# Patient Record
Sex: Female | Born: 1955 | Race: White | Hispanic: No | Marital: Married | Smoking: Never smoker
Health system: Southern US, Community
[De-identification: ages and names within clinical notes are randomized; demographics above are authoritative.]

## PROBLEM LIST (undated history)

## (undated) DIAGNOSIS — E785 Hyperlipidemia, unspecified: Secondary | ICD-10-CM

## (undated) DIAGNOSIS — E119 Type 2 diabetes mellitus without complications: Secondary | ICD-10-CM

## (undated) DIAGNOSIS — H269 Unspecified cataract: Secondary | ICD-10-CM

## (undated) DIAGNOSIS — T7840XA Allergy, unspecified, initial encounter: Secondary | ICD-10-CM

## (undated) DIAGNOSIS — E039 Hypothyroidism, unspecified: Secondary | ICD-10-CM

## (undated) DIAGNOSIS — E079 Disorder of thyroid, unspecified: Secondary | ICD-10-CM

## (undated) DIAGNOSIS — I10 Essential (primary) hypertension: Secondary | ICD-10-CM

## (undated) DIAGNOSIS — M1990 Unspecified osteoarthritis, unspecified site: Secondary | ICD-10-CM

## (undated) DIAGNOSIS — Z981 Arthrodesis status: Secondary | ICD-10-CM

## (undated) DIAGNOSIS — M4800 Spinal stenosis, site unspecified: Secondary | ICD-10-CM

## (undated) DIAGNOSIS — E1169 Type 2 diabetes mellitus with other specified complication: Secondary | ICD-10-CM

## (undated) HISTORY — PX: KNEE SURGERY: SHX244

## (undated) HISTORY — PX: LUMBAR LAMINECTOMY: SHX95

## (undated) HISTORY — PX: TONSILLECTOMY AND ADENOIDECTOMY: SUR1326

## (undated) HISTORY — PX: CERVICAL FUSION: SHX112

## (undated) HISTORY — DX: Type 2 diabetes mellitus with other specified complication: E11.69

## (undated) HISTORY — PX: CATARACT EXTRACTION: SUR2

## (undated) HISTORY — DX: Unspecified cataract: H26.9

## (undated) HISTORY — DX: Allergy, unspecified, initial encounter: T78.40XA

## (undated) HISTORY — PX: ABDOMINAL HYSTERECTOMY: SHX81

## (undated) HISTORY — PX: CARPAL TUNNEL RELEASE: SHX101

## (undated) HISTORY — DX: Hyperlipidemia, unspecified: E78.5

## (undated) HISTORY — DX: Spinal stenosis, site unspecified: M48.00

## (undated) HISTORY — DX: Unspecified osteoarthritis, unspecified site: M19.90

## (undated) HISTORY — PX: SPINE SURGERY: SHX786

## (undated) HISTORY — DX: Type 2 diabetes mellitus without complications: E11.9

---

## 1998-03-05 ENCOUNTER — Encounter: Admission: RE | Admit: 1998-03-05 | Discharge: 1998-06-03 | Payer: Self-pay | Admitting: *Deleted

## 1998-03-27 ENCOUNTER — Ambulatory Visit (HOSPITAL_COMMUNITY): Admission: RE | Admit: 1998-03-27 | Discharge: 1998-03-27 | Payer: Self-pay | Admitting: Neurosurgery

## 1998-03-27 ENCOUNTER — Encounter: Payer: Self-pay | Admitting: Neurosurgery

## 1998-04-10 ENCOUNTER — Encounter: Payer: Self-pay | Admitting: Neurosurgery

## 1998-04-10 ENCOUNTER — Ambulatory Visit (HOSPITAL_COMMUNITY): Admission: RE | Admit: 1998-04-10 | Discharge: 1998-04-10 | Payer: Self-pay | Admitting: Neurosurgery

## 1998-04-24 ENCOUNTER — Ambulatory Visit (HOSPITAL_COMMUNITY): Admission: RE | Admit: 1998-04-24 | Discharge: 1998-04-24 | Payer: Self-pay | Admitting: Neurosurgery

## 1998-04-24 ENCOUNTER — Encounter: Payer: Self-pay | Admitting: Neurosurgery

## 1998-05-14 ENCOUNTER — Encounter: Payer: Self-pay | Admitting: Neurosurgery

## 1998-05-16 ENCOUNTER — Encounter: Payer: Self-pay | Admitting: Neurosurgery

## 1998-05-16 ENCOUNTER — Inpatient Hospital Stay (HOSPITAL_COMMUNITY): Admission: RE | Admit: 1998-05-16 | Discharge: 1998-05-17 | Payer: Self-pay | Admitting: Neurosurgery

## 1998-08-24 ENCOUNTER — Encounter: Payer: Self-pay | Admitting: Neurosurgery

## 1998-08-24 ENCOUNTER — Ambulatory Visit (HOSPITAL_COMMUNITY): Admission: RE | Admit: 1998-08-24 | Discharge: 1998-08-24 | Payer: Self-pay | Admitting: Neurosurgery

## 1998-09-29 ENCOUNTER — Emergency Department (HOSPITAL_COMMUNITY): Admission: EM | Admit: 1998-09-29 | Discharge: 1998-09-29 | Payer: Self-pay | Admitting: Emergency Medicine

## 1998-12-05 ENCOUNTER — Encounter: Payer: Self-pay | Admitting: Neurosurgery

## 1998-12-05 ENCOUNTER — Ambulatory Visit (HOSPITAL_COMMUNITY): Admission: RE | Admit: 1998-12-05 | Discharge: 1998-12-05 | Payer: Self-pay | Admitting: Neurosurgery

## 1999-06-12 ENCOUNTER — Encounter: Admission: RE | Admit: 1999-06-12 | Discharge: 1999-07-24 | Payer: Self-pay | Admitting: Neurosurgery

## 1999-06-29 ENCOUNTER — Other Ambulatory Visit: Admission: RE | Admit: 1999-06-29 | Discharge: 1999-06-29 | Payer: Self-pay | Admitting: Obstetrics and Gynecology

## 1999-10-09 ENCOUNTER — Encounter: Admission: RE | Admit: 1999-10-09 | Discharge: 1999-10-09 | Payer: Self-pay | Admitting: Neurosurgery

## 1999-10-09 ENCOUNTER — Encounter: Payer: Self-pay | Admitting: Neurosurgery

## 1999-11-13 ENCOUNTER — Encounter: Payer: Self-pay | Admitting: Neurosurgery

## 1999-11-13 ENCOUNTER — Ambulatory Visit (HOSPITAL_COMMUNITY): Admission: RE | Admit: 1999-11-13 | Discharge: 1999-11-13 | Payer: Self-pay | Admitting: Neurosurgery

## 2000-10-04 ENCOUNTER — Other Ambulatory Visit: Admission: RE | Admit: 2000-10-04 | Discharge: 2000-10-04 | Payer: Self-pay | Admitting: Obstetrics and Gynecology

## 2002-01-12 ENCOUNTER — Other Ambulatory Visit: Admission: RE | Admit: 2002-01-12 | Discharge: 2002-01-12 | Payer: Self-pay | Admitting: Obstetrics and Gynecology

## 2003-02-28 ENCOUNTER — Other Ambulatory Visit: Admission: RE | Admit: 2003-02-28 | Discharge: 2003-02-28 | Payer: Self-pay | Admitting: Obstetrics and Gynecology

## 2003-05-02 ENCOUNTER — Ambulatory Visit (HOSPITAL_COMMUNITY): Admission: RE | Admit: 2003-05-02 | Discharge: 2003-05-02 | Payer: Self-pay | Admitting: Obstetrics and Gynecology

## 2003-11-05 ENCOUNTER — Inpatient Hospital Stay (HOSPITAL_COMMUNITY): Admission: RE | Admit: 2003-11-05 | Discharge: 2003-11-07 | Payer: Self-pay | Admitting: Obstetrics and Gynecology

## 2004-10-01 ENCOUNTER — Ambulatory Visit (HOSPITAL_BASED_OUTPATIENT_CLINIC_OR_DEPARTMENT_OTHER): Admission: RE | Admit: 2004-10-01 | Discharge: 2004-10-01 | Payer: Self-pay | Admitting: Orthopedic Surgery

## 2004-10-01 ENCOUNTER — Ambulatory Visit (HOSPITAL_COMMUNITY): Admission: RE | Admit: 2004-10-01 | Discharge: 2004-10-01 | Payer: Self-pay | Admitting: Orthopedic Surgery

## 2005-01-08 ENCOUNTER — Ambulatory Visit (HOSPITAL_COMMUNITY): Admission: RE | Admit: 2005-01-08 | Discharge: 2005-01-10 | Payer: Self-pay | Admitting: Neurosurgery

## 2008-09-11 DIAGNOSIS — F988 Other specified behavioral and emotional disorders with onset usually occurring in childhood and adolescence: Secondary | ICD-10-CM | POA: Insufficient documentation

## 2009-11-25 DIAGNOSIS — J309 Allergic rhinitis, unspecified: Secondary | ICD-10-CM | POA: Insufficient documentation

## 2010-06-19 ENCOUNTER — Inpatient Hospital Stay (INDEPENDENT_AMBULATORY_CARE_PROVIDER_SITE_OTHER)
Admission: RE | Admit: 2010-06-19 | Discharge: 2010-06-19 | Disposition: A | Discharge status: Home / Self Care | Payer: 59 | Source: Ambulatory Visit | Attending: Family Medicine | Admitting: Family Medicine

## 2010-06-19 DIAGNOSIS — IMO0002 Reserved for concepts with insufficient information to code with codable children: Secondary | ICD-10-CM

## 2010-06-22 LAB — HM COLONOSCOPY

## 2010-07-02 ENCOUNTER — Other Ambulatory Visit (HOSPITAL_COMMUNITY): Payer: Self-pay | Admitting: Neurosurgery

## 2010-07-02 DIAGNOSIS — M542 Cervicalgia: Secondary | ICD-10-CM

## 2010-07-06 ENCOUNTER — Ambulatory Visit (HOSPITAL_COMMUNITY)
Admission: RE | Admit: 2010-07-06 | Discharge: 2010-07-06 | Disposition: A | Discharge status: Home / Self Care | Payer: 59 | Source: Ambulatory Visit | Attending: Neurosurgery | Admitting: Neurosurgery

## 2010-07-06 DIAGNOSIS — M4802 Spinal stenosis, cervical region: Secondary | ICD-10-CM | POA: Insufficient documentation

## 2010-07-06 DIAGNOSIS — M5124 Other intervertebral disc displacement, thoracic region: Secondary | ICD-10-CM | POA: Insufficient documentation

## 2010-07-06 DIAGNOSIS — Z981 Arthrodesis status: Secondary | ICD-10-CM | POA: Insufficient documentation

## 2010-07-06 DIAGNOSIS — M5020 Other cervical disc displacement, unspecified cervical region: Secondary | ICD-10-CM | POA: Insufficient documentation

## 2010-07-06 DIAGNOSIS — M47812 Spondylosis without myelopathy or radiculopathy, cervical region: Secondary | ICD-10-CM | POA: Insufficient documentation

## 2010-07-06 DIAGNOSIS — M542 Cervicalgia: Secondary | ICD-10-CM | POA: Insufficient documentation

## 2010-07-06 DIAGNOSIS — M25519 Pain in unspecified shoulder: Secondary | ICD-10-CM | POA: Insufficient documentation

## 2010-07-08 ENCOUNTER — Inpatient Hospital Stay (HOSPITAL_COMMUNITY): Admission: RE | Admit: 2010-07-08 | Payer: 59 | Source: Ambulatory Visit

## 2010-07-14 ENCOUNTER — Other Ambulatory Visit: Payer: Self-pay | Admitting: Neurosurgery

## 2010-07-14 DIAGNOSIS — M542 Cervicalgia: Secondary | ICD-10-CM

## 2010-07-14 DIAGNOSIS — M47812 Spondylosis without myelopathy or radiculopathy, cervical region: Secondary | ICD-10-CM

## 2010-07-16 ENCOUNTER — Ambulatory Visit
Admission: RE | Admit: 2010-07-16 | Discharge: 2010-07-16 | Disposition: A | Discharge status: Home / Self Care | Payer: 59 | Source: Ambulatory Visit | Attending: Neurosurgery | Admitting: Neurosurgery

## 2010-07-16 ENCOUNTER — Other Ambulatory Visit: Payer: Self-pay | Admitting: Family Medicine

## 2010-07-16 DIAGNOSIS — M542 Cervicalgia: Secondary | ICD-10-CM

## 2010-07-16 DIAGNOSIS — M47812 Spondylosis without myelopathy or radiculopathy, cervical region: Secondary | ICD-10-CM

## 2010-08-11 ENCOUNTER — Other Ambulatory Visit: Payer: Self-pay | Admitting: Neurosurgery

## 2010-08-11 DIAGNOSIS — M47812 Spondylosis without myelopathy or radiculopathy, cervical region: Secondary | ICD-10-CM

## 2010-08-11 DIAGNOSIS — M542 Cervicalgia: Secondary | ICD-10-CM

## 2010-08-14 ENCOUNTER — Other Ambulatory Visit: Payer: 59

## 2010-12-07 ENCOUNTER — Ambulatory Visit
Admission: RE | Admit: 2010-12-07 | Discharge: 2010-12-07 | Disposition: A | Discharge status: Home / Self Care | Payer: 59 | Source: Ambulatory Visit | Attending: Neurosurgery | Admitting: Neurosurgery

## 2010-12-07 DIAGNOSIS — M542 Cervicalgia: Secondary | ICD-10-CM

## 2010-12-07 DIAGNOSIS — M47812 Spondylosis without myelopathy or radiculopathy, cervical region: Secondary | ICD-10-CM

## 2010-12-07 MED ORDER — TRIAMCINOLONE ACETONIDE 40 MG/ML IJ SUSP (RADIOLOGY)
60.0000 mg | Freq: Once | INTRAMUSCULAR | Status: AC
  Administered 2010-12-07: 60 mg via EPIDURAL

## 2010-12-07 MED ORDER — IOHEXOL 300 MG/ML  SOLN
1.0000 mL | Freq: Once | INTRAMUSCULAR | Status: AC | PRN
  Administered 2010-12-07: 1 mL via EPIDURAL

## 2011-02-15 ENCOUNTER — Other Ambulatory Visit: Payer: Self-pay | Admitting: Dermatology

## 2011-04-28 DIAGNOSIS — M1990 Unspecified osteoarthritis, unspecified site: Secondary | ICD-10-CM | POA: Insufficient documentation

## 2011-06-07 LAB — HM DEXA SCAN

## 2013-01-22 DIAGNOSIS — E669 Obesity, unspecified: Secondary | ICD-10-CM | POA: Insufficient documentation

## 2013-02-23 ENCOUNTER — Other Ambulatory Visit: Payer: Self-pay | Admitting: Dermatology

## 2013-03-26 DIAGNOSIS — E039 Hypothyroidism, unspecified: Secondary | ICD-10-CM | POA: Insufficient documentation

## 2013-09-11 DIAGNOSIS — Z79890 Hormone replacement therapy (postmenopausal): Secondary | ICD-10-CM | POA: Insufficient documentation

## 2013-09-11 DIAGNOSIS — G4700 Insomnia, unspecified: Secondary | ICD-10-CM | POA: Insufficient documentation

## 2013-12-05 ENCOUNTER — Encounter (HOSPITAL_COMMUNITY): Payer: Self-pay | Admitting: Emergency Medicine

## 2013-12-05 ENCOUNTER — Emergency Department (HOSPITAL_COMMUNITY)
Admission: EM | Admit: 2013-12-05 | Discharge: 2013-12-05 | Disposition: A | Discharge status: Home / Self Care | Payer: Worker's Compensation | Attending: Emergency Medicine | Admitting: Emergency Medicine

## 2013-12-05 DIAGNOSIS — Z981 Arthrodesis status: Secondary | ICD-10-CM | POA: Insufficient documentation

## 2013-12-05 DIAGNOSIS — Y9389 Activity, other specified: Secondary | ICD-10-CM | POA: Diagnosis not present

## 2013-12-05 DIAGNOSIS — T148XXA Other injury of unspecified body region, initial encounter: Secondary | ICD-10-CM

## 2013-12-05 DIAGNOSIS — S20309A Unspecified superficial injuries of unspecified front wall of thorax, initial encounter: Secondary | ICD-10-CM | POA: Diagnosis present

## 2013-12-05 DIAGNOSIS — S4992XA Unspecified injury of left shoulder and upper arm, initial encounter: Secondary | ICD-10-CM | POA: Diagnosis not present

## 2013-12-05 DIAGNOSIS — S79911A Unspecified injury of right hip, initial encounter: Secondary | ICD-10-CM | POA: Diagnosis not present

## 2013-12-05 DIAGNOSIS — Y92410 Unspecified street and highway as the place of occurrence of the external cause: Secondary | ICD-10-CM | POA: Diagnosis not present

## 2013-12-05 DIAGNOSIS — S20211A Contusion of right front wall of thorax, initial encounter: Secondary | ICD-10-CM | POA: Diagnosis not present

## 2013-12-05 DIAGNOSIS — S139XXA Sprain of joints and ligaments of unspecified parts of neck, initial encounter: Secondary | ICD-10-CM | POA: Insufficient documentation

## 2013-12-05 DIAGNOSIS — S79922A Unspecified injury of left thigh, initial encounter: Secondary | ICD-10-CM | POA: Insufficient documentation

## 2013-12-05 HISTORY — DX: Disorder of thyroid, unspecified: E07.9

## 2013-12-05 MED ORDER — DIAZEPAM 5 MG PO TABS: ORAL_TABLET | ORAL | Status: DC

## 2013-12-05 NOTE — ED Notes (Signed)
Dr. Walden at bedside 
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
## 2013-12-27 ENCOUNTER — Ambulatory Visit
Admission: RE | Admit: 2013-12-27 | Discharge: 2013-12-27 | Disposition: A | Discharge status: Home / Self Care | Payer: Worker's Compensation | Source: Ambulatory Visit | Attending: Family Medicine | Admitting: Family Medicine

## 2013-12-27 ENCOUNTER — Other Ambulatory Visit: Payer: Self-pay | Admitting: Family Medicine

## 2013-12-27 DIAGNOSIS — R52 Pain, unspecified: Secondary | ICD-10-CM

## 2014-12-18 ENCOUNTER — Other Ambulatory Visit (HOSPITAL_COMMUNITY): Payer: Self-pay | Admitting: Neurosurgery

## 2014-12-18 DIAGNOSIS — M25511 Pain in right shoulder: Secondary | ICD-10-CM

## 2014-12-24 ENCOUNTER — Ambulatory Visit (HOSPITAL_COMMUNITY)
Admission: RE | Admit: 2014-12-24 | Discharge: 2014-12-24 | Disposition: A | Discharge status: Home / Self Care | Payer: 59 | Source: Ambulatory Visit | Attending: Neurosurgery | Admitting: Neurosurgery

## 2014-12-24 DIAGNOSIS — M25511 Pain in right shoulder: Secondary | ICD-10-CM | POA: Diagnosis not present

## 2014-12-24 DIAGNOSIS — M7551 Bursitis of right shoulder: Secondary | ICD-10-CM | POA: Diagnosis not present

## 2014-12-24 DIAGNOSIS — M7581 Other shoulder lesions, right shoulder: Secondary | ICD-10-CM | POA: Insufficient documentation

## 2014-12-24 DIAGNOSIS — M19011 Primary osteoarthritis, right shoulder: Secondary | ICD-10-CM | POA: Insufficient documentation

## 2015-01-28 LAB — HM HEPATITIS C SCREENING LAB: HM HEPATITIS C SCREENING: NEGATIVE

## 2015-04-22 MED FILL — LEVOTHYROXINE 50 MCG TABLET: 50 | 90 days supply | Qty: 90 | Fill #0

## 2015-04-28 DIAGNOSIS — H524 Presbyopia: Secondary | ICD-10-CM | POA: Diagnosis not present

## 2015-04-28 DIAGNOSIS — H52203 Unspecified astigmatism, bilateral: Secondary | ICD-10-CM | POA: Diagnosis not present

## 2015-05-13 DIAGNOSIS — D2371 Other benign neoplasm of skin of right lower limb, including hip: Secondary | ICD-10-CM | POA: Diagnosis not present

## 2015-05-13 DIAGNOSIS — L821 Other seborrheic keratosis: Secondary | ICD-10-CM | POA: Diagnosis not present

## 2015-05-13 DIAGNOSIS — D2271 Melanocytic nevi of right lower limb, including hip: Secondary | ICD-10-CM | POA: Diagnosis not present

## 2015-05-13 DIAGNOSIS — D2372 Other benign neoplasm of skin of left lower limb, including hip: Secondary | ICD-10-CM | POA: Diagnosis not present

## 2015-05-13 DIAGNOSIS — D225 Melanocytic nevi of trunk: Secondary | ICD-10-CM | POA: Diagnosis not present

## 2015-05-13 DIAGNOSIS — Z85828 Personal history of other malignant neoplasm of skin: Secondary | ICD-10-CM | POA: Diagnosis not present

## 2015-05-13 DIAGNOSIS — D2261 Melanocytic nevi of right upper limb, including shoulder: Secondary | ICD-10-CM | POA: Diagnosis not present

## 2015-06-18 MED FILL — ROSUVASTATIN CALCIUM 5 MG T: 5 | 30 days supply | Qty: 30 | Fill #0

## 2015-07-22 DIAGNOSIS — R319 Hematuria, unspecified: Secondary | ICD-10-CM | POA: Diagnosis not present

## 2015-07-22 DIAGNOSIS — R300 Dysuria: Secondary | ICD-10-CM | POA: Diagnosis not present

## 2015-07-29 DIAGNOSIS — E669 Obesity, unspecified: Secondary | ICD-10-CM | POA: Diagnosis not present

## 2015-07-29 DIAGNOSIS — J208 Acute bronchitis due to other specified organisms: Secondary | ICD-10-CM | POA: Diagnosis not present

## 2015-07-29 DIAGNOSIS — E785 Hyperlipidemia, unspecified: Secondary | ICD-10-CM | POA: Diagnosis not present

## 2015-07-29 DIAGNOSIS — E782 Mixed hyperlipidemia: Secondary | ICD-10-CM | POA: Diagnosis not present

## 2015-07-29 DIAGNOSIS — E1169 Type 2 diabetes mellitus with other specified complication: Secondary | ICD-10-CM | POA: Diagnosis not present

## 2015-07-30 MED FILL — ESTRADIOL 0.5 MG TABLET: 0.5 | 90 days supply | Qty: 180 | Fill #0

## 2015-07-31 MED FILL — BENZONATATE 200 MG CAPSULE: 200 | 7 days supply | Qty: 20 | Fill #0

## 2015-07-31 MED FILL — ROSUVASTATIN CALCIUM 5 MG T: 5 | 90 days supply | Qty: 90 | Fill #1

## 2015-08-20 MED FILL — LEVOTHYROXINE 50 MCG TABLET: 50 | 90 days supply | Qty: 90 | Fill #1

## 2015-09-22 ENCOUNTER — Ambulatory Visit (INDEPENDENT_AMBULATORY_CARE_PROVIDER_SITE_OTHER): Payer: 59 | Admitting: Physician Assistant

## 2015-09-22 VITALS — BP 118/70 | HR 87 | Temp 97.8°F | Resp 18 | Ht 65.0 in | Wt 187.2 lb

## 2015-09-22 DIAGNOSIS — J069 Acute upper respiratory infection, unspecified: Secondary | ICD-10-CM | POA: Diagnosis not present

## 2015-09-22 MED ORDER — PROMETHAZINE-DM 6.25-15 MG/5ML PO SYRP: 5.0000 mL | ORAL_SOLUTION | Freq: Four times a day (QID) | ORAL | 0 refills | Status: DC | PRN

## 2015-09-22 MED ORDER — AMOXICILLIN-POT CLAVULANATE 875-125 MG PO TABS: 1.0000 | ORAL_TABLET | Freq: Two times a day (BID) | ORAL | 0 refills | Status: AC

## 2015-09-22 MED FILL — AMOX-CLAV 875-125 MG TABLET: 875-125 | 5 days supply | Qty: 10 | Fill #0

## 2015-09-22 MED FILL — PROMETHAZINE-DM SYRUP: 6.25-15 | 5 days supply | Qty: 118 | Fill #0

## 2015-09-22 NOTE — Progress Notes (Signed)
Holly Riley  MRN: [id number] DOB: Jun 18, 1955  Subjective:nm   Holly Riley is a 60 y.o. female seen in office today for a chief complaint of illness x 1 week. Started out as a sore throat and then developed a dry hacking cough. The cough is worse during night time and interferes with patient's sleep. Has associated headache, bilateral ear pain, facial pressure, and congestion. Denies fever, sneezing, and itching watery eyes. Has tried tessalon perles and mucinex with mild relief. 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   Review of Systems  Constitutional: Negative for activity change, chills and diaphoresis.  Respiratory: Negative for chest tightness, shortness of breath and wheezing.   Cardiovascular: Negative for chest pain.  Gastrointestinal: Negative for abdominal pain, diarrhea, nausea and vomiting.  Musculoskeletal: Negative for myalgias.  Neurological: Negative for dizziness, weakness and light-headedness.  Psychiatric/Behavioral: Positive for sleep disturbance (due to cough).    Patient Active Problem List   Diagnosis Date Noted  . Insomnia 09/11/2013  . Post-menopause on HRT (hormone replacement therapy) 09/11/2013  . Hypothyroidism 03/26/2013  . Obesity (BMI 30.0-34.9) 01/22/2013  . Osteoarthritis 04/28/2011  . Allergic rhinitis 11/25/2009  . ADD (attention deficit disorder) 09/11/2008    Current Outpatient Prescriptions on File Prior to Visit  Medication Sig Dispense Refill  . acetaminophen (TYLENOL) 500 MG tablet Take 500 mg by mouth every 6 (six) hours as needed.    . diazepam (VALIUM) 5 MG tablet Take 1-2 tablets every 8 hours as needed for muscle spasm 12 tablet 0  . Flaxseed, Linseed, (FLAX SEED OIL PO) Take 1 tablet by mouth daily.    . Garlic 10 MG CAPS Take 10 mg by mouth daily.    Marland Kitchen levothyroxine (SYNTHROID, LEVOTHROID) 50 MCG tablet Take 50 mcg by mouth daily before breakfast.    . Omega-3  Fatty Acids (FISH OIL CONCENTRATE) 1000 MG CAPS Take 1,000 mg by mouth daily.    . Cinnamon 500 MG TABS Take 500 mg by mouth daily.    . Multiple Vitamins-Minerals (MULTIVITAMIN WITH MINERALS) tablet Take 1 tablet by mouth daily.     No current facility-administered medications on file prior to visit.     Allergies  Allergen Reactions  . Morphine And Related Other (See Comments)    hallucinations hallucinations  . Statins Other (See Comments)    myalgis  . Percocet [Oxycodone-Acetaminophen] Itching and Other (See Comments)    tachycardia  . Vicodin [Hydrocodone-Acetaminophen]    Past Medical History:  Diagnosis Date  . Arthritis   . Hyperlipidemia   . Thyroid disease      Objective:  BP 118/70   Pulse 87   Temp 97.8 F (36.6 C) (Oral)   Resp 18   Ht 5\' 5"  (1.651 m)   Wt 187 lb 3.2 oz (84.9 kg)   SpO2 96%   BMI 31.15 kg/m   Physical Exam  Constitutional: She is oriented to person, place, and time and well-developed, well-nourished, and in no distress.  HENT:  Head: Normocephalic and atraumatic.  Right Ear: Hearing, tympanic membrane, external ear and ear canal normal.  Left Ear: Hearing, external ear and ear canal normal. Left ear bulging TM: TM with slight bulge, no pus noted behind TM.  Nose: Mucosal edema present. Right sinus exhibits frontal sinus tenderness (minimal). Right sinus exhibits no maxillary sinus tenderness. Left sinus exhibits frontal sinus tenderness (minimal ). Left sinus exhibits no maxillary  sinus tenderness.  Mouth/Throat: Uvula is midline. Posterior oropharyngeal erythema present. No oropharyngeal exudate or posterior oropharyngeal edema.  Eyes: Conjunctivae are normal.  Neck: Normal range of motion.  Cardiovascular: Normal rate, regular rhythm and normal heart sounds.   Pulmonary/Chest: Effort normal and breath sounds normal.  Lymphadenopathy:       Head (right side): No submental, no submandibular, no tonsillar, no preauricular, no posterior  auricular and no occipital adenopathy present.       Head (left side): No submental, no submandibular, no tonsillar, no preauricular, no posterior auricular and no occipital adenopathy present.    She has no cervical adenopathy.       Right: No supraclavicular adenopathy present.       Left: No supraclavicular adenopathy present.  Neurological: She is alert and oriented to person, place, and time. Gait normal.  Skin: Skin is warm and dry.  Psychiatric: Affect normal.  Vitals reviewed.    Assessment and Plan :  1. 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 Given future prescription for antibiotic if supportive care and cough syrup do not provide any relief in 3-4 days.  - promethazine-dextromethorphan (PROMETHAZINE-DM) 6.25-15 MG/5ML syrup; Take 5 mLs by mouth 4 (four) times daily as needed for cough.  Dispense: 118 mL; Refill: 0 - amoxicillin-clavulanate (AUGMENTIN) 875-125 MG tablet; Take 1 tablet by mouth 2 (two) times daily.  Dispense: 10 tablet; Refill: 0    Tenna Delaine PA-C  [facility] 09/22/2015 12:51 PM

## 2015-09-22 NOTE — Patient Instructions (Addendum)
 -   I recommend you rest, drink plenty of fluids, eat light meals including soups.  - You may use cough syrup at night for your cough and sore throat, Tessalon pearls during the day. Be aware that cough syrup can definitely make you drowsy and sleepy so do not drive or operate any heavy machinery if it is affecting you during the day.  - You may also use Tylenol or ibuprofen over-the-counter for your sore throat.  - Please let me know if you are not seeing any improvement or get worse in 7 days.  If after 3-4 days you have no relief, I have sent in a antibiotic for you to pick up.      IF you received an x-ray today, you will receive an invoice from [facility]. Please contact [facility] at [phone] with questions or concerns regarding your invoice.   IF you received labwork today, you will receive an invoice from [employer]. Please contact [facility] at [phone] with questions or concerns regarding your invoice.   Our billing staff will not be able to assist you with questions regarding bills from these companies.  You will be contacted with the lab results as soon as they are available. The fastest way to get your results is to activate your My Chart account. Instructions are located on the last page of this paperwork. If you have not heard from [place] regarding the results in 2 weeks, please contact this office.

## 2015-10-01 ENCOUNTER — Other Ambulatory Visit: Payer: Self-pay | Admitting: Family Medicine

## 2015-10-01 ENCOUNTER — Telehealth (HOSPITAL_COMMUNITY): Payer: Self-pay | Admitting: *Deleted

## 2015-10-01 DIAGNOSIS — R002 Palpitations: Secondary | ICD-10-CM | POA: Diagnosis not present

## 2015-10-01 DIAGNOSIS — R9431 Abnormal electrocardiogram [ECG] [EKG]: Secondary | ICD-10-CM | POA: Diagnosis not present

## 2015-10-01 DIAGNOSIS — F430 Acute stress reaction: Secondary | ICD-10-CM | POA: Diagnosis not present

## 2015-10-01 DIAGNOSIS — J209 Acute bronchitis, unspecified: Secondary | ICD-10-CM | POA: Diagnosis not present

## 2015-10-01 MED FILL — predniSONE 20 MG TABS: 20 | 5 days supply | Qty: 10 | Fill #0

## 2015-10-01 MED FILL — ZOLPIDEM TARTRATE 10 MG TAB: 10 | 30 days supply | Qty: 30 | Fill #0

## 2015-10-01 NOTE — Telephone Encounter (Signed)
Patient given detailed instructions per Myocardial Perfusion Study Information Sheet for the test on 10/06/15 at 1000. Patient notified to arrive 15 minutes early and that it is imperative to arrive on time for appointment to keep from having the test rescheduled.  If you need to cancel or reschedule your appointment, please call the office within 24 hours of your appointment. Failure to do so may result in a cancellation of your appointment, and a $50 no show fee. Patient verbalized understanding.Merly Hinkson, Ranae Palms

## 2015-10-06 ENCOUNTER — Ambulatory Visit (HOSPITAL_COMMUNITY): Payer: 59 | Attending: Cardiovascular Disease

## 2015-10-06 DIAGNOSIS — Z8249 Family history of ischemic heart disease and other diseases of the circulatory system: Secondary | ICD-10-CM | POA: Insufficient documentation

## 2015-10-06 DIAGNOSIS — R9431 Abnormal electrocardiogram [ECG] [EKG]: Secondary | ICD-10-CM | POA: Diagnosis not present

## 2015-10-06 DIAGNOSIS — R0789 Other chest pain: Secondary | ICD-10-CM | POA: Diagnosis not present

## 2015-10-06 DIAGNOSIS — R0609 Other forms of dyspnea: Secondary | ICD-10-CM | POA: Insufficient documentation

## 2015-10-06 DIAGNOSIS — R002 Palpitations: Secondary | ICD-10-CM | POA: Diagnosis not present

## 2015-10-06 LAB — MYOCARDIAL PERFUSION IMAGING
CHL CUP NUCLEAR SRS: 7
CHL CUP NUCLEAR SSS: 8
LV dias vol: 74 mL (ref 46–106)
LV sys vol: 22 mL
NUC STRESS TID: 0.95
Peak HR: 103 {beats}/min
RATE: 0.34
Rest HR: 71 {beats}/min
SDS: 4

## 2015-10-06 MED ORDER — TECHNETIUM TC 99M TETROFOSMIN IV KIT
32.3000 | PACK | Freq: Once | INTRAVENOUS | Status: AC | PRN
  Administered 2015-10-06: 32.3 via INTRAVENOUS
  Filled 2015-10-06: qty 32

## 2015-10-06 MED ORDER — TECHNETIUM TC 99M TETROFOSMIN IV KIT
10.2000 | PACK | Freq: Once | INTRAVENOUS | Status: AC | PRN
Start: 2015-10-06 — End: 2015-10-06
  Administered 2015-10-06: 10 via INTRAVENOUS
  Filled 2015-10-06: qty 10

## 2015-10-06 MED ORDER — REGADENOSON 0.4 MG/5ML IV SOLN
0.4000 mg | Freq: Once | INTRAVENOUS | Status: AC
  Administered 2015-10-06: 0.4 mg via INTRAVENOUS

## 2015-10-16 DIAGNOSIS — F4320 Adjustment disorder, unspecified: Secondary | ICD-10-CM | POA: Diagnosis not present

## 2015-10-16 DIAGNOSIS — F410 Panic disorder [episodic paroxysmal anxiety] without agoraphobia: Secondary | ICD-10-CM | POA: Diagnosis not present

## 2015-10-31 DIAGNOSIS — Z1231 Encounter for screening mammogram for malignant neoplasm of breast: Secondary | ICD-10-CM | POA: Diagnosis not present

## 2015-10-31 DIAGNOSIS — Z803 Family history of malignant neoplasm of breast: Secondary | ICD-10-CM | POA: Diagnosis not present

## 2015-11-04 LAB — HM MAMMOGRAPHY

## 2015-11-24 MED FILL — LEVOTHYROXINE 50 MCG TABLET: 50 | 90 days supply | Qty: 90 | Fill #0

## 2015-11-24 MED FILL — ROSUVASTATIN CALCIUM 5 MG T: 5 | 90 days supply | Qty: 90 | Fill #2

## 2016-02-05 DIAGNOSIS — E039 Hypothyroidism, unspecified: Secondary | ICD-10-CM | POA: Diagnosis not present

## 2016-02-05 DIAGNOSIS — Z0000 Encounter for general adult medical examination without abnormal findings: Secondary | ICD-10-CM | POA: Diagnosis not present

## 2016-02-05 DIAGNOSIS — E785 Hyperlipidemia, unspecified: Secondary | ICD-10-CM | POA: Diagnosis not present

## 2016-02-05 DIAGNOSIS — E1169 Type 2 diabetes mellitus with other specified complication: Secondary | ICD-10-CM | POA: Diagnosis not present

## 2016-02-05 DIAGNOSIS — E782 Mixed hyperlipidemia: Secondary | ICD-10-CM | POA: Diagnosis not present

## 2016-02-05 DIAGNOSIS — Z79890 Hormone replacement therapy (postmenopausal): Secondary | ICD-10-CM | POA: Diagnosis not present

## 2016-02-14 MED FILL — ESTRADIOL 0.5 MG TABLET: 0.5 | 90 days supply | Qty: 180 | Fill #1

## 2016-02-25 MED FILL — LEVOTHYROXINE 75 MCG TABLET: 75 | 90 days supply | Qty: 90 | Fill #0

## 2016-04-16 DIAGNOSIS — L821 Other seborrheic keratosis: Secondary | ICD-10-CM | POA: Diagnosis not present

## 2016-04-16 DIAGNOSIS — D225 Melanocytic nevi of trunk: Secondary | ICD-10-CM | POA: Diagnosis not present

## 2016-04-16 DIAGNOSIS — D2271 Melanocytic nevi of right lower limb, including hip: Secondary | ICD-10-CM | POA: Diagnosis not present

## 2016-04-16 DIAGNOSIS — D2272 Melanocytic nevi of left lower limb, including hip: Secondary | ICD-10-CM | POA: Diagnosis not present

## 2016-04-16 DIAGNOSIS — Z85828 Personal history of other malignant neoplasm of skin: Secondary | ICD-10-CM | POA: Diagnosis not present

## 2016-04-16 DIAGNOSIS — D2262 Melanocytic nevi of left upper limb, including shoulder: Secondary | ICD-10-CM | POA: Diagnosis not present

## 2016-04-16 DIAGNOSIS — D692 Other nonthrombocytopenic purpura: Secondary | ICD-10-CM | POA: Diagnosis not present

## 2016-04-16 DIAGNOSIS — D224 Melanocytic nevi of scalp and neck: Secondary | ICD-10-CM | POA: Diagnosis not present

## 2016-04-16 DIAGNOSIS — D2372 Other benign neoplasm of skin of left lower limb, including hip: Secondary | ICD-10-CM | POA: Diagnosis not present

## 2016-04-20 DIAGNOSIS — E039 Hypothyroidism, unspecified: Secondary | ICD-10-CM | POA: Diagnosis not present

## 2016-04-20 DIAGNOSIS — G4726 Circadian rhythm sleep disorder, shift work type: Secondary | ICD-10-CM | POA: Diagnosis not present

## 2016-04-20 DIAGNOSIS — J0100 Acute maxillary sinusitis, unspecified: Secondary | ICD-10-CM | POA: Diagnosis not present

## 2016-04-20 MED FILL — CEFDINIR 300 MG CAPSULE: 300 | 10 days supply | Qty: 20 | Fill #0

## 2016-04-21 MED FILL — ZOLPIDEM TARTRATE 10 MG TAB: 10 | 30 days supply | Qty: 30 | Fill #0

## 2016-05-24 MED FILL — LEVOTHYROXINE 75 MCG TABLET: 75 | 90 days supply | Qty: 90 | Fill #1

## 2016-05-24 MED FILL — ROSUVASTATIN CALCIUM 5 MG T: 5 | 90 days supply | Qty: 90 | Fill #3

## 2016-07-15 MED FILL — ESTRADIOL 0.5 MG TABLET: 0.5 | 90 days supply | Qty: 180 | Fill #2

## 2016-08-16 MED FILL — ZOLPIDEM TARTRATE 10 MG TAB: 10 | 30 days supply | Qty: 30 | Fill #1

## 2016-08-16 MED FILL — LEVOTHYROXINE 75 MCG TABLET: 75 | 90 days supply | Qty: 90 | Fill #0

## 2016-08-17 MED FILL — ROSUVASTATIN CALCIUM 5 MG T: 5 | 30 days supply | Qty: 30 | Fill #0

## 2016-10-13 ENCOUNTER — Ambulatory Visit (INDEPENDENT_AMBULATORY_CARE_PROVIDER_SITE_OTHER): Payer: 59 | Admitting: Family Medicine

## 2016-10-13 ENCOUNTER — Encounter: Payer: Self-pay | Admitting: Family Medicine

## 2016-10-13 ENCOUNTER — Ambulatory Visit (INDEPENDENT_AMBULATORY_CARE_PROVIDER_SITE_OTHER): Payer: 59

## 2016-10-13 VITALS — BP 133/81 | HR 93 | Temp 98.0°F | Resp 18 | Ht 65.0 in | Wt 198.6 lb

## 2016-10-13 DIAGNOSIS — S99919A Unspecified injury of unspecified ankle, initial encounter: Secondary | ICD-10-CM | POA: Diagnosis not present

## 2016-10-13 DIAGNOSIS — Z23 Encounter for immunization: Secondary | ICD-10-CM | POA: Diagnosis not present

## 2016-10-13 DIAGNOSIS — S8990XA Unspecified injury of unspecified lower leg, initial encounter: Secondary | ICD-10-CM

## 2016-10-13 DIAGNOSIS — S99922A Unspecified injury of left foot, initial encounter: Secondary | ICD-10-CM

## 2016-10-13 DIAGNOSIS — S99929A Unspecified injury of unspecified foot, initial encounter: Secondary | ICD-10-CM

## 2016-10-13 DIAGNOSIS — S92512A Displaced fracture of proximal phalanx of left lesser toe(s), initial encounter for closed fracture: Secondary | ICD-10-CM | POA: Diagnosis not present

## 2016-10-13 NOTE — Patient Instructions (Addendum)
   IF you received an x-ray today, you will receive an invoice from [facility]. Please contact [facility] at [phone] with questions or concerns regarding your invoice.   IF you received labwork today, you will receive an invoice from [facility]. Please contact [facility] at [phone] with questions or concerns regarding your invoice.   Our billing staff will not be able to assist you with questions regarding bills from these companies.  You will be contacted with the lab results as soon as they are available. The fastest way to get your results is to activate your My Chart account. Instructions are located on the last page of this paperwork. If you have not heard from us regarding the results in 2 weeks, please contact this office.     Toe Fracture A toe fracture is a break in one of the toe bones (phalanges). What are the causes? This condition may be caused by:  Dropping a heavy object on your toe.  Stubbing your toe.  Overusing your toe or doing repetitive exercise.  Twisting or stretching your toe out of place.  What increases the risk? This condition is more likely to develop in people who:  Play contact sports.  Have a bone disease.  Have a low calcium level.  What are the signs or symptoms? The main symptoms of this condition are swelling and pain in the toe. The pain may get worse with standing or walking. Other symptoms include:  Bruising.  Stiffness.  Numbness.  A change in the way the toe looks.  Broken bones that poke through the skin.  Blood beneath the toenail.  How is this diagnosed? This condition is diagnosed with a physical exam. You may also have X-rays. How is this treated? Treatment for this condition depends on the type of fracture and its severity. Treatment may involve:  Taping the broken toe to a toe that is next to it (buddy taping). This is the most common treatment for fractures in which the bone has not  moved out of place (nondisplaced fracture).  Wearing a shoe that has a wide, rigid sole to protect the toe and to limit its movement.  Wearing a walking cast.  Having a procedure to move the toe back into place.  Surgery. This may be needed: ? If there are many pieces of broken bone that are out of place (displaced). ? If the toe joint breaks. ? If the bone breaks through the skin.  Physical therapy. This is done to help regain movement and strength in the toe.  You may need follow-up X-rays to make sure that the bone is healing well and staying in position. Follow these instructions at home: If you have a cast:  Do not stick anything inside the cast to scratch your skin. Doing that increases your risk of infection.  Check the skin around the cast every day. Report any concerns to your health care provider. You may put lotion on dry skin around the edges of the cast. Do not apply lotion to the skin underneath the cast.  Do not put pressure on any part of the cast until it is fully hardened. This may take several hours.  Keep the cast clean and dry. Bathing  Do not take baths, swim, or use a hot tub until your health care provider approves. Ask your health care provider if you can take showers. You may only be allowed to take sponge baths for bathing.  If your health care provider approves   bathing and showering, cover the cast or bandage (dressing) with a watertight plastic bag to protect it from water. Do not let the cast or dressing get wet. Managing pain, stiffness, and swelling  If you do not have a cast, apply ice to the injured area, if directed. ? Put ice in a plastic bag. ? Place a towel between your skin and the bag. ? Leave the ice on for 20 minutes, 2-3 times per day.  Move your toes often to avoid stiffness and to lessen swelling.  Raise (elevate) the injured area above the level of your heart while you are sitting or lying down. Driving  Do not drive or operate  heavy machinery while taking pain medicine.  Do not drive while wearing a cast on a foot that you use for driving. Activity  Return to your normal activities as directed by your health care provider. Ask your health care provider what activities are safe for you.  Perform exercises daily as directed by your health care provider or physical therapist. Safety  Do not use the injured limb to support your body weight until your health care provider says that you can. Use crutches or other assistive devices as directed by your health care provider. General instructions  If your toe was treated with buddy taping, follow your health care provider's instructions for changing the gauze and tape. Change it more often: ? The gauze and tape get wet. If this happens, dry the space between the toes. ? The gauze and tape are too tight and cause your toe to become pale or numb.  Wear a protective shoe as directed by your health care provider. If you were not given a protective shoe, wear sturdy, supportive shoes. Your shoes should not pinch your toes and should not fit tightly against your toes.  Do not use any tobacco products, including cigarettes, chewing tobacco, or e-cigarettes. Tobacco can delay bone healing. If you need help quitting, ask your health care provider.  Take medicines only as directed by your health care provider.  Keep all follow-up visits as directed by your health care provider. This is important. Contact a health care provider if:  You have a fever.  Your pain medicine is not helping.  Your toe is cold.  Your toe is numb.  You still have pain after one week of rest and treatment.  You still have pain after your health care provider has said that you can start walking again.  You have pain, tingling, or numbness in your foot that is not going away. Get help right away if:  You have severe pain.  You have redness or inflammation in your toe that is getting  worse.  You have pain or numbness in your toe that is getting worse.  Your toe turns blue. This information is not intended to replace advice given to you by your health care provider. Make sure you discuss any questions you have with your health care provider. Document Released: 02/13/2000 Document Revised: 10/20/2015 Document Reviewed: 12/12/2013 [employer]vier Interactive Patient Education  2018 [employer]  

## 2016-10-13 NOTE — Progress Notes (Addendum)
Subjective:  By signing my name below, I, Essence Howell, attest that this documentation has been prepared under the direction and in the presence of Delman Cheadle, MD Electronically Signed: Ladene Artist, ED Scribe 10/13/2016 at 11:17 AM.   Patient ID: Holly Riley, female    DOB: 1955/06/08, 61 y.o.   MRN: [id number]  Chief Complaint  Patient presents with  . Foot Injury    left foot, pt states injury happened last night. Pt states she hit the side of her foot on the coffe table but it isn't sure if it was the side of her foot or the top. She did not ice it at home.   HPI Holly Riley is a 61 y.o. female who presents to Primary Care at [facility] complaining of a left foot injury sustained last night. Pt states that she struck her left foot on the side of a coffee table, but is unsure if she hit the top or side of her foot. She has noticed bruising to her pinky toe and swelling onset last night. Pt has been able to bear weight since the incident but reports increased pain with this. She has tried elevating her foot.  Past Medical History:  Diagnosis Date  . Arthritis   . Hyperlipidemia   . Thyroid disease    Current Outpatient Prescriptions on File Prior to Visit  Medication Sig Dispense Refill  . acetaminophen (TYLENOL) 500 MG tablet Take 500 mg by mouth every 6 (six) hours as needed.    . Benzonatate (TESSALON PO) Take by mouth.    . Cinnamon 500 MG TABS Take 500 mg by mouth daily.    . diazepam (VALIUM) 5 MG tablet Take 1-2 tablets every 8 hours as needed for muscle spasm 12 tablet 0  . Flaxseed, Linseed, (FLAX SEED OIL PO) Take 1 tablet by mouth daily.    . Garlic 10 MG CAPS Take 10 mg by mouth daily.    Marland Kitchen levothyroxine (SYNTHROID, LEVOTHROID) 50 MCG tablet Take 50 mcg by mouth daily before breakfast.    . LOVASTATIN PO Take 10 mg by mouth.    . MELATONIN PO Take by mouth.    . Multiple Vitamins-Minerals (MULTIVITAMIN WITH MINERALS) tablet Take 1 tablet by mouth daily.    .  Omega-3 Fatty Acids (FISH OIL CONCENTRATE) 1000 MG CAPS Take 1,000 mg by mouth daily.    . promethazine-dextromethorphan (PROMETHAZINE-DM) 6.25-15 MG/5ML syrup Take 5 mLs by mouth 4 (four) times daily as needed for cough. 118 mL 0  . Red Yeast Rice Extract (RED YEAST RICE PO) Take by mouth.     No current facility-administered medications on file prior to visit.    Allergies  Allergen Reactions  . Morphine And Related Other (See Comments)    hallucinations hallucinations  . Statins Other (See Comments)    myalgis  . Percocet [Oxycodone-Acetaminophen] Itching and Other (See Comments)    tachycardia  . Vicodin [Hydrocodone-Acetaminophen]    Review of Systems  Musculoskeletal: Positive for arthralgias and joint swelling.  Skin: Positive for color change.      Objective:   Physical Exam  Constitutional: She is oriented to person, place, and time. She appears well-developed and well-nourished. No distress.  HENT:  Head: Normocephalic and atraumatic.  Eyes: Conjunctivae and EOM are normal.  Neck: Neck supple. No tracheal deviation present.  Cardiovascular: Normal rate.   Pulmonary/Chest: Effort normal. No respiratory distress.  Musculoskeletal: Normal range of motion.  Point tenderness most over the distal fifth  metatarsal. No pain over the proximal fifth metatarsal. Passive ROM.  Neurological: She is alert and oriented to person, place, and time.  Skin: Skin is warm and dry.  Psychiatric: She has a normal mood and affect. Her behavior is normal.  Nursing note and vitals reviewed.  Dg Toe 5th Left  Result Date: 10/13/2016 CLINICAL DATA:  Fifth toe injury last night. Pain and bruising noted over the distal fifth metatarsal and the fifth toe. EXAM: DG TOE 5TH LEFT COMPARISON:  None in PACs FINDINGS: The patient has sustained an acute mildly angulated spiral fracture of the distal aspect of the shaft of the proximal phalanx of the fifth toe. The middle and distal phalanges of the fifth  toe are intact. The fifth metatarsal is intact. The adjacent phalanges are normal where visualized. IMPRESSION: There is an acute mildly angulated spiral fracture of the distal 1/2 of the shaft of the proximal phalanx of the fifth toe. Electronically Signed   By: David  Martinique M.D.   On: 10/13/2016 11:29   BP 133/81   Pulse 93   Temp 98 F (36.7 C) (Oral)   Resp 18   Ht 5\' 5"  (1.651 m)   Wt 198 lb 9.6 oz (90.1 kg)   SpO2 95%   BMI 33.05 kg/m     Assessment & Plan:  Seen ortho [facility] prior  1. Foot injury, left, initial encounter - buddy taped with splint laterally and placed in post-op shoe for 5th phalynx spiral fracture - is slightly displaced so may warrant ortho referral - will have pt follow-up in 1 wk for recheck - repeat xray to ensure not further displaced.  2. Flu vaccine need     Orders Placed This Encounter  Procedures  . DG Toe 5th Left    Standing Status:   Future    Number of Occurrences:   1    Standing Expiration Date:   10/13/2017    Order Specific Question:   Reason for Exam (SYMPTOM  OR DIAGNOSIS REQUIRED)    Answer:   injury last night w/ no PROM, ecchymotic, tenderness over dorsum distal 5th metatarsal/5th MTP    Order Specific Question:   Preferred imaging location?    Answer:   External    Meds ordered this encounter  Medications  . zolpidem (AMBIEN) 5 MG tablet    Sig: Take 5 mg by mouth at bedtime as needed for sleep.  Marland Kitchen estradiol (ESTRACE) 0.5 MG tablet    Sig: Take 0.5 mg by mouth daily.   FRACTURE CODE CHARGED. ALL F/U VISITS ARE TO BE WITH ME AND ARE TO BE "POST-OP NO CHARGE."  I personally performed the services described in this documentation, which was scribed in my presence. The recorded information has been reviewed and considered, and addended by me as needed.   Delman Cheadle, M.D.  Primary Care at [facility] [address] [place], [place] [address] [phone] phone [phone] fax  10/15/16 11:56 PM

## 2016-10-21 ENCOUNTER — Ambulatory Visit (INDEPENDENT_AMBULATORY_CARE_PROVIDER_SITE_OTHER): Payer: 59 | Admitting: Family Medicine

## 2016-10-21 ENCOUNTER — Ambulatory Visit (INDEPENDENT_AMBULATORY_CARE_PROVIDER_SITE_OTHER): Payer: 59

## 2016-10-21 ENCOUNTER — Encounter: Payer: Self-pay | Admitting: Family Medicine

## 2016-10-21 VITALS — BP 138/83 | HR 80 | Temp 97.6°F | Resp 16 | Ht 65.75 in | Wt 198.0 lb

## 2016-10-21 DIAGNOSIS — S92512D Displaced fracture of proximal phalanx of left lesser toe(s), subsequent encounter for fracture with routine healing: Secondary | ICD-10-CM

## 2016-10-21 DIAGNOSIS — S92512A Displaced fracture of proximal phalanx of left lesser toe(s), initial encounter for closed fracture: Secondary | ICD-10-CM | POA: Diagnosis not present

## 2016-10-21 NOTE — Patient Instructions (Signed)
     IF you received an x-ray today, you will receive an invoice from [facility]. Please contact [facility] at [phone] with questions or concerns regarding your invoice.   IF you received labwork today, you will receive an invoice from [facility]. Please contact [facility] at [phone] with questions or concerns regarding your invoice.   Our billing staff will not be able to assist you with questions regarding bills from these companies.  You will be contacted with the lab results as soon as they are available. The fastest way to get your results is to activate your My Chart account. Instructions are located on the last page of this paperwork. If you have not heard from us regarding the results in 2 weeks, please contact this office.     

## 2016-10-24 NOTE — Addendum Note (Signed)
Addended by: Shawnee Knapp on: 10/24/2016 06:47 AM   Modules accepted: Level of Service

## 2016-10-24 NOTE — Progress Notes (Signed)
Subjective:    Patient ID: Holly Riley, female    DOB: [birth year], 61 y.o.   MRN: [id number] Chief Complaint  Patient presents with  . Foot Injury    follow-up from 8/15    HPI  Is starting to feel better. Did have trouble keeping it wrapped/buddy taped well. Could not wear splin at all - immed got in way upon leaving office and had to remove. 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  Past Medical History:  Diagnosis Date  . Arthritis   . Hyperlipidemia   . Thyroid disease    Past Surgical History:  Procedure Laterality Date  . ABDOMINAL HYSTERECTOMY    . BACK SURGERY    . CARPAL TUNNEL RELEASE Right   . KNEE SURGERY Left   . NECK SURGERY    . SPINE SURGERY    . TONSILLECTOMY AND ADENOIDECTOMY     Current Outpatient Prescriptions on File Prior to Visit  Medication Sig Dispense Refill  . acetaminophen (TYLENOL) 500 MG tablet Take 500 mg by mouth every 6 (six) hours as needed.    . Benzonatate (TESSALON PO) Take by mouth.    . Cinnamon 500 MG TABS Take 500 mg by mouth daily.    Marland Kitchen estradiol (ESTRACE) 0.5 MG tablet Take 0.5 mg by mouth daily.    . Flaxseed, Linseed, (FLAX SEED OIL PO) Take 1 tablet by mouth daily.    . Garlic 10 MG CAPS Take 10 mg by mouth daily.    Marland Kitchen levothyroxine (SYNTHROID, LEVOTHROID) 50 MCG tablet Take 50 mcg by mouth daily before breakfast.    . LOVASTATIN PO Take 10 mg by mouth.    . MELATONIN PO Take by mouth.    . Multiple Vitamins-Minerals (MULTIVITAMIN WITH MINERALS) tablet Take 1 tablet by mouth daily.    . Omega-3 Fatty Acids (FISH OIL CONCENTRATE) 1000 MG CAPS Take 1,000 mg by mouth daily.    . Red Yeast Rice Extract (RED YEAST RICE PO) Take by mouth.    . zolpidem (AMBIEN) 5 MG tablet Take 5 mg by mouth at bedtime as needed for sleep.     No current facility-administered medications on file prior to visit.     Allergies  Allergen Reactions  . Morphine And Related Other (See Comments)    hallucinations hallucinations  . Statins Other (See Comments)    myalgis  . Percocet [Oxycodone-Acetaminophen] Itching and Other (See Comments)    tachycardia  . Vicodin [Hydrocodone-Acetaminophen]    Family History  Problem Relation Age of Onset  . Cancer Mother   . Hyperlipidemia Mother   . Cancer Father   . Diabetes Father   . Heart disease Father   . Hyperlipidemia Father   . Cancer Sister   . Diabetes Sister   . Hyperlipidemia Sister   . Diabetes Brother   . Hyperlipidemia Sister    Social History   Social History  . Marital status: Married    Spouse name: N/A  . Number of children: N/A  . Years of education: N/A   Social History Main Topics  . Smoking status: Never Smoker  . Smokeless tobacco: Never Used  . Alcohol use No  . Drug use: Unknown  . Sexual activity: Not Asked   Other Topics Concern  . None   Social History Narrative  . None   Depression screen  [facility] 2/9 10/21/2016 10/13/2016 09/22/2015  Decreased Interest 0 0 0  Down, Depressed, Hopeless 0 0 0  PHQ - 2 Score 0 0 0     Review of Systems  Constitutional: Negative for activity change, chills, fever and unexpected weight change.  Cardiovascular: Negative for leg swelling.  Musculoskeletal: Positive for arthralgias, gait problem and joint swelling. Negative for back pain and myalgias.  Skin: Positive for color change. Negative for pallor and rash.  Neurological: Positive for weakness. Negative for numbness.      Objective:   Physical Exam  Constitutional: She is oriented to person, place, and time. She appears well-developed and well-nourished. No distress.  HENT:  Head: Normocephalic and atraumatic.  Right Ear: External ear normal.  Eyes: Conjunctivae are normal. No scleral icterus.  Pulmonary/Chest: Effort normal.  Musculoskeletal:  Left 5th toe with slight rotation laterally in my perception judging by  nail appearance but pt thinks it is at her baseline.  Pain with pushing off ball of foot when not wearing shoe. Ecchymoses resolved. No AROM in L 5th MTP.  Neurological: She is alert and oriented to person, place, and time.  Skin: Skin is warm and dry. She is not diaphoretic. No erythema.  Psychiatric: She has a normal mood and affect. Her behavior is normal.    BP 138/83   Pulse 80   Temp 97.6 F (36.4 C) (Oral)   Resp 16   Ht 5' 5.75" (1.67 m)   Wt 198 lb (89.8 kg)   SpO2 98%   BMI 32.20 kg/m   Dg Toe 5th Left  Result Date: 10/21/2016 CLINICAL DATA:  Three of an angulated fracture of the fifth toe. Assess healing and alignment. EXAM: DG TOE 5TH LEFT COMPARISON:  Left fifth toe radiograph of October 13, 2016 FINDINGS: The fracture line remains visible. Alignment is near anatomic. No significant periosteal reaction is observed. IMPRESSION: Spiral fracture of the distal 1/2 of the shaft of the proximal phalanx of the fifth toe with alignment near anatomic. No significant bony bridging is observed. Electronically Signed   By: David  Martinique M.D.   On: 10/21/2016 09:15   Dg Toe 5th Left  Result Date: 10/13/2016 CLINICAL DATA:  Fifth toe injury last night. Pain and bruising noted over the distal fifth metatarsal and the fifth toe. EXAM: DG TOE 5TH LEFT COMPARISON:  None in PACs FINDINGS: The patient has sustained an acute mildly angulated spiral fracture of the distal aspect of the shaft of the proximal phalanx of the fifth toe. The middle and distal phalanges of the fifth toe are intact. The fifth metatarsal is intact. The adjacent phalanges are normal where visualized. IMPRESSION: There is an acute mildly angulated spiral fracture of the distal 1/2 of the shaft of the proximal phalanx of the fifth toe. Electronically Signed   By: David  Martinique M.D.   On: 10/13/2016 11:29       Assessment & Plan:   1. Closed displaced fracture of proximal phalanx of lesser toe of left foot with routine  healing, subsequent encounter   Pt sxs sig improved but I still think it looks a little rotated so repeated xray to ensure it had not become further displaced as she has been working on her feet all week - fortunately, repeat xray is very reassuring.  Make sure to buddy tape clockwise for the counter rotation and focus on alignment.  RICE when possible. As long as continued gradual improvement as she has already noted, ok to recheck in  1 mo. RTC if pain worsening/persisiting.  Orders Placed This Encounter  Procedures  . DG Toe 5th Left    Standing Status:   Future    Number of Occurrences:   1    Standing Expiration Date:   10/21/2017    Order Specific Question:   Reason for Exam (SYMPTOM  OR DIAGNOSIS REQUIRED)    Answer:   f/u angulated 5th toe fracture healing, ensure good alignment    Order Specific Question:   Preferred imaging location?    Answer:   External     Delman Cheadle, M.D.  Primary Care at [facility] [address] [place], [place] [address] [phone] phone [phone] fax  10/24/16 6:33 AM

## 2016-10-28 DIAGNOSIS — E785 Hyperlipidemia, unspecified: Secondary | ICD-10-CM | POA: Diagnosis not present

## 2016-10-28 DIAGNOSIS — E1169 Type 2 diabetes mellitus with other specified complication: Secondary | ICD-10-CM | POA: Diagnosis not present

## 2016-10-28 DIAGNOSIS — F988 Other specified behavioral and emotional disorders with onset usually occurring in childhood and adolescence: Secondary | ICD-10-CM | POA: Diagnosis not present

## 2016-10-28 LAB — HEMOGLOBIN A1C: Hemoglobin A1C: 5.8

## 2016-10-28 MED FILL — ROSUVASTATIN CALCIUM 5 MG T: 5 | 90 days supply | Qty: 90 | Fill #0

## 2016-11-02 DIAGNOSIS — Z1231 Encounter for screening mammogram for malignant neoplasm of breast: Secondary | ICD-10-CM | POA: Diagnosis not present

## 2016-11-17 MED FILL — LEVOTHYROXINE 75 MCG TABLET: 75 | 90 days supply | Qty: 90 | Fill #1

## 2016-11-18 ENCOUNTER — Ambulatory Visit (INDEPENDENT_AMBULATORY_CARE_PROVIDER_SITE_OTHER): Payer: 59 | Admitting: Family Medicine

## 2016-11-18 ENCOUNTER — Encounter: Payer: Self-pay | Admitting: Family Medicine

## 2016-11-18 ENCOUNTER — Ambulatory Visit (INDEPENDENT_AMBULATORY_CARE_PROVIDER_SITE_OTHER): Payer: 59

## 2016-11-18 VITALS — BP 129/81 | HR 79 | Temp 97.4°F | Resp 18 | Ht 65.75 in | Wt 200.2 lb

## 2016-11-18 DIAGNOSIS — S92512D Displaced fracture of proximal phalanx of left lesser toe(s), subsequent encounter for fracture with routine healing: Secondary | ICD-10-CM | POA: Diagnosis not present

## 2016-11-18 NOTE — Progress Notes (Signed)
Subjective:    Patient ID: Holly Riley, female    DOB: [birth year], 61 y.o.   MRN: [id number] Chief Complaint  Patient presents with  . Foot Injury    left foot, fracture, Pt states foot is better and swelling has decreased a lot but when she works it does swell.  . Follow-up    Foot Injury   Pertinent negatives include no numbness.   feeling much better.    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 Does swell more at the end of the day.  Past Medical History:  Diagnosis Date  . Arthritis   . Hyperlipidemia   . Thyroid disease    Past Surgical History:  Procedure Laterality Date  . ABDOMINAL HYSTERECTOMY    . BACK SURGERY    . CARPAL TUNNEL RELEASE Right   . KNEE SURGERY Left   . NECK SURGERY    . SPINE SURGERY    . TONSILLECTOMY AND ADENOIDECTOMY     Current Outpatient Prescriptions on File Prior to Visit  Medication Sig Dispense Refill  . acetaminophen (TYLENOL) 500 MG tablet Take 500 mg by mouth every 6 (six) hours as needed.    . Benzonatate (TESSALON PO) Take by mouth.    . Cinnamon 500 MG TABS Take 500 mg by mouth daily.    Marland Kitchen estradiol (ESTRACE) 0.5 MG tablet Take 0.5 mg by mouth daily.    . Flaxseed, Linseed, (FLAX SEED OIL PO) Take 1 tablet by mouth daily.    . Garlic 10 MG CAPS Take 10 mg by mouth daily.    Marland Kitchen levothyroxine (SYNTHROID, LEVOTHROID) 50 MCG tablet Take 50 mcg by mouth daily before breakfast.    . LOVASTATIN PO Take 10 mg by mouth.    . MELATONIN PO Take by mouth.    . Multiple Vitamins-Minerals (MULTIVITAMIN WITH MINERALS) tablet Take 1 tablet by mouth daily.    . Omega-3 Fatty Acids (FISH OIL CONCENTRATE) 1000 MG CAPS Take 1,000 mg by mouth daily.    . Red Yeast Rice Extract (RED YEAST RICE PO) Take by mouth.     . zolpidem (AMBIEN) 5 MG tablet Take 5 mg by mouth at bedtime as needed for sleep.     No current facility-administered medications on file prior to visit.    Allergies  Allergen Reactions  . Morphine And Related Other (See Comments)    hallucinations hallucinations  . Statins Other (See Comments)    myalgis  . Percocet [Oxycodone-Acetaminophen] Itching and Other (See Comments)    tachycardia  . Vicodin [Hydrocodone-Acetaminophen]    Family History  Problem Relation Age of Onset  . Cancer Mother   . Hyperlipidemia Mother   . Cancer Father   . Diabetes Father   . Heart disease Father   . Hyperlipidemia Father   . Cancer Sister   . Diabetes Sister   . Hyperlipidemia Sister   . Diabetes Brother   . Hyperlipidemia Sister    Social History   Social History  . Marital status: Married    Spouse name: N/A  . Number of children: N/A  . Years of education: N/A   Social History Main Topics  . Smoking status: Never Smoker  . Smokeless tobacco:  Never Used  . Alcohol use No  . Drug use: Unknown  . Sexual activity: Not on file   Other Topics Concern  . Not on file   Social History Narrative  . No narrative on file   Depression screen [facility] 2/9 11/18/2016 10/21/2016 10/13/2016 09/22/2015  Decreased Interest 0 0 0 0  Down, Depressed, Hopeless 0 0 0 0  PHQ - 2 Score 0 0 0 0     Review of Systems  Constitutional: Negative for activity change, chills, fever and unexpected weight change.  Cardiovascular: Negative for leg swelling.  Musculoskeletal: Positive for arthralgias. Negative for back pain, gait problem, joint swelling and myalgias.  Skin: Negative for color change, pallor and rash.  Neurological: Negative for weakness and numbness.      Objective:   Physical Exam  Constitutional: She is oriented to person, place, and time. She appears well-developed and well-nourished. No distress.  HENT:  Head: Normocephalic and atraumatic.  Right Ear: External ear normal.    Eyes: Conjunctivae are normal. No scleral icterus.  Pulmonary/Chest: Effort normal.  Musculoskeletal:  Left 5th toe in good alignment with only slightly reduced AROM at 5th MTP. Ecchymoses resolved. Slightly ttp just prox to 5th MTP only but not at all over digit. Wearing post-op shoe at visit.  Neurological: She is alert and oriented to person, place, and time.  Skin: Skin is warm and dry. She is not diaphoretic. No erythema.  Psychiatric: She has a normal mood and affect. Her behavior is normal.     BP 129/81 (BP Location: Right Arm, Patient Position: Sitting, Cuff Size: Large)   Pulse 79   Temp (!) 97.4 F (36.3 C) (Oral)   Resp 18   Ht 5' 5.75" (1.67 m)   Wt 200 lb 3.2 oz (90.8 kg)   SpO2 95%   BMI 32.56 kg/m   Dg Toe 5th Left  Result Date: 10/21/2016 CLINICAL DATA:  Three of an angulated fracture of the fifth toe. Assess healing and alignment. EXAM: DG TOE 5TH LEFT COMPARISON:  Left fifth toe radiograph of October 13, 2016 FINDINGS: The fracture line remains visible. Alignment is near anatomic. No significant periosteal reaction is observed. IMPRESSION: Spiral fracture of the distal 1/2 of the shaft of the proximal phalanx of the fifth toe with alignment near anatomic. No significant bony bridging is observed. Electronically Signed   By: David  Martinique M.D.   On: 10/21/2016 09:15   Dg Toe 5th Left  Result Date: 10/13/2016 CLINICAL DATA:  Fifth toe injury last night. Pain and bruising noted over the distal fifth metatarsal and the fifth toe. EXAM: DG TOE 5TH LEFT COMPARISON:  None in PACs FINDINGS: The patient has sustained an acute mildly angulated spiral fracture of the distal aspect of the shaft of the proximal phalanx of the fifth toe. The middle and distal phalanges of the fifth toe are intact. The fifth metatarsal is intact. The adjacent phalanges are normal where visualized. IMPRESSION: There is an acute mildly angulated spiral fracture of the distal 1/2 of the shaft of the  proximal phalanx of the fifth toe. Electronically Signed   By: David  Martinique M.D.   On: 10/13/2016 11:29   Dg Toe 5th Left  Result Date: 11/18/2016 CLINICAL DATA:  Follow-up fracture EXAM: DG TOE 5TH LEFT COMPARISON:  10/21/2016 FINDINGS: Fracture of the proximal phalanx is undergoing healing, but is not yet completely healed. No change in position or alignment. No new regional finding. IMPRESSION: Ongoing healing. Healing not complete.  No change in position or alignment. Electronically Signed   By: Nelson Chimes M.D.   On: 11/18/2016 12:35       Assessment & Plan:   1. Closed displaced fracture of proximal phalanx of lesser toe of left foot with routine healing, subsequent encounter    repeated xray which shows good alignment but not yet complete on healing so cont post-op shoe for minimum of 2 wks. Would like be ok just to transfer back to normal shoe wear and activity when she is 100% pain/sx free or can recheck with xray in 2-4 wks prior to transfer out of post-op shoe. . RTC if pain worsening/persisiting.  Orders Placed This Encounter  Procedures  . DG Toe 5th Left    Standing Status:   Future    Number of Occurrences:   1    Standing Expiration Date:   10/21/2017    Order Specific Question:   Reason for Exam (SYMPTOM  OR DIAGNOSIS REQUIRED)    Answer:   f/u angulated 5th toe fracture healing, ensure good alignment    Order Specific Question:   Preferred imaging location?    Answer:   External   FRACTURE CODE CHARGED ON 10/13/16 SO PT IN GLOBAL 90D F/U PERIOD. DO NOT BILL FOR F/U OV THROUGH 01/13/17.  Delman Cheadle, M.D.  Primary Care at [facility] [address] [place], [place] [address] [phone] phone [phone] fax  11/18/16 11:52 AM

## 2016-11-18 NOTE — Patient Instructions (Addendum)
IF you received an x-ray today, you will receive an invoice from [facility]. Please contact [facility] at [phone] with questions or concerns regarding your invoice.   IF you received labwork today, you will receive an invoice from [facility]. Please contact [facility] at [phone] with questions or concerns regarding your invoice.   Our billing staff will not be able to assist you with questions regarding bills from these companies.  You will be contacted with the lab results as soon as they are available. The fastest way to get your results is to activate your My Chart account. Instructions are located on the last page of this paperwork. If you have not heard from [place] regarding the results in 2 weeks, please contact this office.     Toe Fracture A toe fracture is a break in one of the toe bones (phalanges). What are the causes? This condition may be caused by:  Dropping a heavy object on your toe.  Stubbing your toe.  Overusing your toe or doing repetitive exercise.  Twisting or stretching your toe out of place.  What increases the risk? This condition is more likely to develop in people who:  Play contact sports.  Have a bone disease.  Have a low calcium level.  What are the signs or symptoms? The main symptoms of this condition are swelling and pain in the toe. The pain may get worse with standing or walking. Other symptoms include:  Bruising.  Stiffness.  Numbness.  A change in the way the toe looks.  Broken bones that poke through the skin.  Blood beneath the toenail.  How is this diagnosed? This condition is diagnosed with a physical exam. You may also have X-rays. How is this treated? Treatment for this condition depends on the type of fracture and its severity. Treatment may involve:  Taping the broken toe to a toe that is next to it (buddy taping). This is the most common treatment for fractures in which the bone has not  moved out of place (nondisplaced fracture).  Wearing a shoe that has a wide, rigid sole to protect the toe and to limit its movement.  Wearing a walking cast.  Having a procedure to move the toe back into place.  Surgery. This may be needed: ? If there are many pieces of broken bone that are out of place (displaced). ? If the toe joint breaks. ? If the bone breaks through the skin.  Physical therapy. This is done to help regain movement and strength in the toe.  You may need follow-up X-rays to make sure that the bone is healing well and staying in position. Follow these instructions at home: If you have a cast:  Do not stick anything inside the cast to scratch your skin. Doing that increases your risk of infection.  Check the skin around the cast every day. Report any concerns to your health care provider. You may put lotion on dry skin around the edges of the cast. Do not apply lotion to the skin underneath the cast.  Do not put pressure on any part of the cast until it is fully hardened. This may take several hours.  Keep the cast clean and dry. Bathing  Do not take baths, swim, or use a hot tub until your health care provider approves. Ask your health care provider if you can take showers. You may only be allowed to take sponge baths for bathing.  If your health care provider approves  bathing and showering, cover the cast or bandage (dressing) with a watertight plastic bag to protect it from water. Do not let the cast or dressing get wet. Managing pain, stiffness, and swelling  If you do not have a cast, apply ice to the injured area, if directed. ? Put ice in a plastic bag. ? Place a towel between your skin and the bag. ? Leave the ice on for 20 minutes, 2-3 times per day.  Move your toes often to avoid stiffness and to lessen swelling.  Raise (elevate) the injured area above the level of your heart while you are sitting or lying down. Driving  Do not drive or operate  heavy machinery while taking pain medicine.  Do not drive while wearing a cast on a foot that you use for driving. Activity  Return to your normal activities as directed by your health care provider. Ask your health care provider what activities are safe for you.  Perform exercises daily as directed by your health care provider or physical therapist. Safety  Do not use the injured limb to support your body weight until your health care provider says that you can. Use crutches or other assistive devices as directed by your health care provider. General instructions  If your toe was treated with buddy taping, follow your health care provider's instructions for changing the gauze and tape. Change it more often: ? The gauze and tape get wet. If this happens, dry the space between the toes. ? The gauze and tape are too tight and cause your toe to become pale or numb.  Wear a protective shoe as directed by your health care provider. If you were not given a protective shoe, wear sturdy, supportive shoes. Your shoes should not pinch your toes and should not fit tightly against your toes.  Do not use any tobacco products, including cigarettes, chewing tobacco, or e-cigarettes. Tobacco can delay bone healing. If you need help quitting, ask your health care provider.  Take medicines only as directed by your health care provider.  Keep all follow-up visits as directed by your health care provider. This is important. Contact a health care provider if:  You have a fever.  Your pain medicine is not helping.  Your toe is cold.  Your toe is numb.  You still have pain after one week of rest and treatment.  You still have pain after your health care provider has said that you can start walking again.  You have pain, tingling, or numbness in your foot that is not going away. Get help right away if:  You have severe pain.  You have redness or inflammation in your toe that is getting  worse.  You have pain or numbness in your toe that is getting worse.  Your toe turns blue. This information is not intended to replace advice given to you by your health care provider. Make sure you discuss any questions you have with your health care provider. Document Released: 02/13/2000 Document Revised: 10/20/2015 Document Reviewed: 12/12/2013 Elsevier Interactive Patient Education  [employer].

## 2016-11-19 ENCOUNTER — Telehealth: Payer: Self-pay | Admitting: Family Medicine

## 2016-11-21 NOTE — Telephone Encounter (Signed)
SENT PT MY CHART EMAIL WITH RESULTS.

## 2016-11-25 ENCOUNTER — Encounter: Payer: Self-pay | Admitting: Family Medicine

## 2016-11-26 ENCOUNTER — Telehealth: Payer: Self-pay

## 2016-11-26 NOTE — Telephone Encounter (Signed)
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 She is still in the 90d follow-up global fracture period. The tone of her email sounds upset so I'm sure she would appreciate a call if you think appropriate.  I probably would have just given her one and told her that we will submit it to her insurance but we do not know if they will cover it or not and if not, she would receive a bill for it but at this point I don't think my telling her that would be of any help. Thank you so much for your help Holly Riley! Harmon Pier

## 2016-11-26 NOTE — Telephone Encounter (Signed)
Pt came into the office with complaint that post op shoe was broken. It is past the 14 day return period. I advised the patient that we cannot bill insurance for another shoe per advice from [place]. Pt was upset about this. Recommended that she go to a DME store to get post op shoe replaced.

## 2016-12-13 MED FILL — ESTRADIOL 0.5 MG TABLET: 0.5 | 90 days supply | Qty: 180 | Fill #0

## 2017-01-06 MED FILL — predniSONE 20 MG TABS: 20 | 5 days supply | Qty: 10 | Fill #0

## 2017-01-06 MED FILL — CEPHALEXIN 500 MG CAPSULE: 500 | 7 days supply | Qty: 21 | Fill #0

## 2017-02-04 ENCOUNTER — Encounter: Payer: Self-pay | Admitting: *Deleted

## 2017-02-04 ENCOUNTER — Other Ambulatory Visit: Payer: Self-pay | Admitting: *Deleted

## 2017-02-07 ENCOUNTER — Ambulatory Visit: Payer: Self-pay | Admitting: Family Medicine

## 2017-02-07 MED FILL — ROSUVASTATIN CALCIUM 5 MG T: 5 | 90 days supply | Qty: 90 | Fill #1

## 2017-02-14 ENCOUNTER — Encounter: Payer: Self-pay | Admitting: Family Medicine

## 2017-02-17 ENCOUNTER — Ambulatory Visit (INDEPENDENT_AMBULATORY_CARE_PROVIDER_SITE_OTHER): Payer: 59 | Admitting: Family Medicine

## 2017-02-17 ENCOUNTER — Encounter: Payer: Self-pay | Admitting: Family Medicine

## 2017-02-17 VITALS — BP 130/80 | HR 77 | Temp 97.9°F | Ht 65.5 in | Wt 202.0 lb

## 2017-02-17 DIAGNOSIS — M79604 Pain in right leg: Secondary | ICD-10-CM

## 2017-02-17 DIAGNOSIS — E039 Hypothyroidism, unspecified: Secondary | ICD-10-CM | POA: Diagnosis not present

## 2017-02-17 DIAGNOSIS — M48061 Spinal stenosis, lumbar region without neurogenic claudication: Secondary | ICD-10-CM

## 2017-02-17 DIAGNOSIS — E782 Mixed hyperlipidemia: Secondary | ICD-10-CM | POA: Diagnosis not present

## 2017-02-17 DIAGNOSIS — M4800 Spinal stenosis, site unspecified: Secondary | ICD-10-CM | POA: Insufficient documentation

## 2017-02-17 DIAGNOSIS — Z114 Encounter for screening for human immunodeficiency virus [HIV]: Secondary | ICD-10-CM

## 2017-02-17 DIAGNOSIS — E1169 Type 2 diabetes mellitus with other specified complication: Secondary | ICD-10-CM | POA: Diagnosis not present

## 2017-02-17 DIAGNOSIS — M79605 Pain in left leg: Secondary | ICD-10-CM

## 2017-02-17 DIAGNOSIS — E119 Type 2 diabetes mellitus without complications: Secondary | ICD-10-CM

## 2017-02-17 LAB — COMPREHENSIVE METABOLIC PANEL
ALBUMIN: 3.8 g/dL (ref 3.5–5.2)
ALK PHOS: 79 U/L (ref 39–117)
ALT: 20 U/L (ref 0–35)
AST: 17 U/L (ref 0–37)
BILIRUBIN TOTAL: 0.4 mg/dL (ref 0.2–1.2)
BUN: 12 mg/dL (ref 6–23)
CALCIUM: 8.7 mg/dL (ref 8.4–10.5)
CO2: 29 meq/L (ref 19–32)
CREATININE: 0.83 mg/dL (ref 0.40–1.20)
Chloride: 105 mEq/L (ref 96–112)
GFR: 73.7 mL/min (ref >60.00)
Glucose, Bld: 114 mg/dL — ABNORMAL HIGH (ref 70–99)
Potassium: 3.9 mEq/L (ref 3.5–5.1)
Sodium: 140 mEq/L (ref 135–145)
TOTAL PROTEIN: 6.6 g/dL (ref 6.0–8.3)

## 2017-02-17 LAB — CBC WITH DIFFERENTIAL/PLATELET
BASOS ABS: 0.1 10*3/uL (ref 0.0–0.1)
Basophils Relative: 0.7 % (ref 0.0–3.0)
EOS ABS: 0.2 10*3/uL (ref 0.0–0.7)
Eosinophils Relative: 2 % (ref 0.0–5.0)
HEMATOCRIT: 45.4 % (ref 36.0–46.0)
HEMOGLOBIN: 15 g/dL (ref 12.0–15.0)
LYMPHS PCT: 31.7 % (ref 12.0–46.0)
Lymphs Abs: 2.5 10*3/uL (ref 0.7–4.0)
MCHC: 32.9 g/dL (ref 30.0–36.0)
MCV: 97.7 fl (ref 78.0–100.0)
MONOS PCT: 9.9 % (ref 3.0–12.0)
Monocytes Absolute: 0.8 10*3/uL (ref 0.1–1.0)
Neutro Abs: 4.5 10*3/uL (ref 1.4–7.7)
Neutrophils Relative %: 55.7 % (ref 43.0–77.0)
Platelets: 273 10*3/uL (ref 150.0–400.0)
RBC: 4.65 Mil/uL (ref 3.87–5.11)
RDW: 13.1 % (ref 11.5–15.5)
WBC: 8 10*3/uL (ref 4.0–10.5)

## 2017-02-17 LAB — LIPID PANEL
CHOL/HDL RATIO: 3
Cholesterol: 184 mg/dL (ref 0–200)
HDL: 68.6 mg/dL (ref >39.00)
LDL Cholesterol: 98 mg/dL (ref 0–99)
NonHDL: 115.63
TRIGLYCERIDES: 88 mg/dL (ref 0.0–149.0)
VLDL: 17.6 mg/dL (ref 0.0–40.0)

## 2017-02-17 LAB — MICROALBUMIN / CREATININE URINE RATIO
Creatinine,U: 157.3 mg/dL
MICROALB/CREAT RATIO: 0.4 mg/g (ref 0.0–30.0)

## 2017-02-17 LAB — HEMOGLOBIN A1C: Hgb A1c MFr Bld: 6.5 % (ref 4.6–6.5)

## 2017-02-17 LAB — VITAMIN D 25 HYDROXY (VIT D DEFICIENCY, FRACTURES): VITD: 18.85 ng/mL — AB (ref 30.00–100.00)

## 2017-02-17 LAB — TSH: TSH: 2.03 u[IU]/mL (ref 0.35–4.50)

## 2017-02-17 MED ORDER — ROSUVASTATIN CALCIUM 5 MG PO TABS: 5.0000 mg | ORAL_TABLET | Freq: Every day | ORAL | 3 refills | Status: DC

## 2017-02-17 NOTE — Progress Notes (Signed)
Subjective  CC:  Chief Complaint  Patient presents with  . Establish Care    Transfer from [facility]    HPI: Holly Riley is a 61 y.o. female who presents to the office today for follow up of diabetes, hypertension and problems listed above in the chief complaint. She is a former [facility] patient and is here to reestablish care with me today. Last cpe 01/2016 but pt defers cpe today. Last dm f/u 09/2016. I have reviewed those notes and labs  Diabetic f/u: Her diabetic control is reported as Unchanged. It has been well controlled. She is concerned about her weight. Diet is good but works 7p-7a in [employer]. Poor erratic sleep schedule.  She denies exertional CP or SOB or symptomatic hypoglycemia. She denies foot sores.    Hypertension f/u: Control is good . Pt reports she is doing well. taking medications as instructed, no medication side effects noted, no TIAs, no chest pain on exertion, no dyspnea on exertion, no swelling of ankles.  She denies adverse effects from his BP medications. Compliance with medication is good.   Obesity: reviewed weight fform 2014 to present. It has fluctuated between 190-200. She is frustrated.   Hyperlipidemia f/u: on crestor 5. Last ldl was 106 (above goal).   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  She did have myalgias on simvastatin in the past.  She has been on Crestor for about 2 years.  She reports her leg pain is worsened over the last 2 months.  History of back pain-patient reports low back pain with some radicular symptoms in right leg.  She is a long history of osteoarthritis of the spine, history of herniated disc of the spine.  Also reports history of spinal stenosis with last MRI being about 2 years ago.  She denies bowel or bladder incontinence.  She denies weakness in the extremities.  She sees Dr. Reuel Boom of neurosurgery.  Hypothyroidism-she thinks this could be contributing to her weight gain although her most  recent TSH in August was normal.  She is compliant with her medications.  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  I reviewed the patients updated PMH, FH, and SocHx.  Patient Active Problem List   Diagnosis Date Noted  . Controlled type 2 diabetes mellitus without complication, without long-term current use of insulin ([place]) 02/17/2017    Priority: High  . Post-menopause on HRT (hormone replacement therapy) 09/11/2013    Priority: High  . Hypothyroidism 03/26/2013    Priority: High  . Obesity (BMI 30.0-34.9) 01/22/2013    Priority: High  . 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 Insomnia 09/11/2013    Priority: Medium  . Osteoarthritis 04/28/2011    Priority: Medium  . Allergic rhinitis 11/25/2009    Priority: Low  . Combined hyperlipidemia associated with type 2 diabetes mellitus ([place]) 02/17/2017  . Spinal stenosis    Immunization History  Administered Date(s) Administered  . DTaP 04/27/2006  . Hepatitis B, ped/adol 04/28/1991  . Influenza Split 12/26/2007  . Influenza, Quadrivalent, Recombinant, Inj, Pf 01/22/2013  . Influenza, Seasonal, Injecte, Preservative Fre 12/13/2013, 11/20/2014  . Influenza,inj,Quad PF,6+ Mos 12/17/2015  . Influenza-Unspecified 12/14/2016  . Pneumococcal Polysaccharide-23 08/27/2014  . Tdap 10/19/2014   Health Maintenance  Topic Date Due  . OPHTHALMOLOGY EXAM  09/15/1963  . URINE MICROALBUMIN  09/15/1963  . HIV Screening  09/14/1968  . MAMMOGRAM  11/03/2016  . HEMOGLOBIN A1C  04/28/2017  . FOOT EXAM  02/17/2018  . PNEUMOCOCCAL POLYSACCHARIDE VACCINE (2) 08/27/2019  . COLONOSCOPY  06/21/2020  . TETANUS/TDAP  10/18/2024  . INFLUENZA VACCINE  Completed  . Hepatitis C Screening  Completed   Diabetes and HTN Related Lab Review: Lab Results  Component Value Date   HGBA1C 5.8 10/28/2016   Last LDL 106  BP Readings from Last 3 Encounters:  02/17/17  130/80  11/18/16 129/81  10/21/16 138/83   Wt Readings from Last 3 Encounters:  02/17/17 202 lb (91.6 kg)  11/18/16 200 lb 3.2 oz (90.8 kg)  10/21/16 198 lb (89.8 kg)    Allergies: Patient is allergic to morphine and related; statins; percocet [oxycodone-acetaminophen]; and vicodin [hydrocodone-acetaminophen]. Family History: Patient family history includes Cancer in her father, mother, and sister; Diabetes in her brother, father, and sister; Heart disease in her father; Hyperlipidemia in her father, mother, sister, and sister. Social History:  Patient  reports that  has never smoked. she has never used smokeless tobacco. She reports that she does not drink alcohol or use drugs.  Review of Systems: Ophthalmic: negative for eye pain, loss of vision or double vision Cardiovascular: negative for chest pain Respiratory: negative for SOB or persistent cough Gastrointestinal: negative for abdominal pain Genitourinary: negative for dysuria or gross hematuria MSK: negative for foot lesions Neurologic: negative for weakness or gait disturbance Current Meds  Medication Sig  . acetaminophen (TYLENOL) 500 MG tablet Take 500 mg by mouth every 6 (six) hours as needed.  . Cinnamon 500 MG TABS Take 500 mg by mouth daily.  Marland Kitchen co-enzyme Q-10 30 MG capsule Take 30 mg by mouth daily.   Marland Kitchen estradiol (ESTRACE) 0.5 MG tablet Take 0.5 mg by mouth daily.  . Flaxseed, Linseed, (FLAX SEED OIL PO) Take 1 tablet by mouth daily.  . fluticasone (FLONASE) 50 MCG/ACT nasal spray Place 1 spray into the nose daily as needed.   . Garlic 10 MG CAPS Take 10 mg by mouth daily.  Marland Kitchen levothyroxine (SYNTHROID, LEVOTHROID) 75 MCG tablet TAKE ONE TABLET BY MOUTH DAILY.  Marland Kitchen MELATONIN PO Take 10 mg by mouth as needed.   . Omega-3 Fatty Acids (FISH OIL CONCENTRATE) 1000 MG CAPS Take 1,000 mg by mouth daily.  . Red Yeast Rice Extract (RED YEAST RICE PO) Take by mouth.  . zolpidem (AMBIEN) 5 MG tablet Take 5 mg by mouth at bedtime  as needed for sleep.  . [DISCONTINUED] LOVASTATIN PO Take 5 mg by mouth daily.     Objective  Vitals: BP 130/80 (BP Location: Left Arm, Patient Position: Sitting, Cuff Size: Large)   Pulse 77   Temp 97.9 F (36.6 C) (Oral)   Ht 5' 5.5" (1.664 m)   Wt 202 lb (91.6 kg)   SpO2 96%   BMI 33.10 kg/m  General: well appearing, no acute distress  Psych:  Alert and oriented, normal mood and affect HEENT:  Normocephalic, atraumatic, moist mucous membranes, supple neck  Cardiovascular:  Nl S1 and S2, RRR without murmur, gallop or rub. no edema Respiratory:  Good breath sounds bilaterally, CTAB with normal effort, no rales Gastrointestinal: normal BS, soft, nontender Skin:  Warm, no rashes Neurologic:   Mental status is normal. normal gait Foot exam: no erythema, pallor, or cyanosis visible nl proprioception and sensation to monofilament testing bilaterally, +2 distal pulses bilaterally  Assessment  1. Controlled type 2 diabetes mellitus without  complication, without long-term current use of insulin ([place])   2. Combined hyperlipidemia associated with type 2 diabetes mellitus ([place])   3. Spinal stenosis of lumbar region without neurogenic claudication   4. Pain in both lower extremities   5. Acquired hypothyroidism   6. Encounter for screening for HIV      Plan   Diabetes is currently very well controlled.  Recheck today.  Immunizations are up-to-date.  Eye exam is up-to-date.  Recheck lipids.  Hypertension is currently well controlled.  Continue current medications and low-sodium diet  Obesity: Discussed difficulty with erratic sleep schedule and limited exercise with weight loss.  She may not be taking in enough calories on a daily basis.  Discussed better eating strategies.  Recheck TSH although I doubt this is the problem.  Need to manage stress and work life balance as well.  Leg pain-new problem and is likely multifactorial in nature.  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  Standing on  her feet for those hours a contributing leg pain however will check vitamin D levels, discussed possibility of statin intolerance, discussed possibility of pain related to her spinal stenosis.  Recommend stretching, NSAIDs, and follow-up with neurosurgery. Diabetic education: ongoing education regarding chronic disease management for diabetes was given today. We continue to reinforce the ABC's of diabetic management: A1c (<7 or 8 dependent upon patient), tight blood pressure control, and cholesterol management with goal LDL < 100 minimally. We discuss diet strategies, exercise recommendations, medication options and possible side effects. At each visit, we review recommended immunizations and preventive care recommendations for diabetics and stress that good diabetic control can prevent other problems. See below for this patient's data. Hypertension education: ongoing education regarding management of these chronic disease states was given. Management strategies discussed on successive visits include dietary and exercise recommendations, goals of achieving and maintaining IBW, and lifestyle modifications aiming for adequate sleep and minimizing stressors.   Follow up: 3 MONTHS recheck leg pain and diabetes.   Commons side effects, risks, benefits, and alternatives for medications and treatment plan prescribed today were discussed, and the patient expressed understanding of the given instructions. Patient is instructed to call or message via MyChart if he/she has any questions or concerns regarding our treatment plan. No barriers to understanding were identified. We discussed Red Flag symptoms and signs in detail. Patient expressed understanding regarding what to do in case of urgent or emergency type symptoms.   Medication list was reconciled, printed and provided to the patient in AVS. Patient instructions and summary information was reviewed with the patient as documented in the AVS. This note was  prepared with assistance of Dragon voice recognition software. Occasional wrong-word or sound-a-like substitutions may have occurred due to the inherent limitations of voice recognition software  Orders Placed This Encounter  Procedures  . CBC with Differential/Platelet  . Comprehensive metabolic panel  . Lipid panel  . HIV antibody  . Hemoglobin A1c  . TSH  . VITAMIN D 25 Hydroxy (Vit-D Deficiency, Fractures)   No orders of the defined types were placed in this encounter.

## 2017-02-17 NOTE — Patient Instructions (Addendum)
It was so good seeing you again! Thank you for establishing with my new practice and allowing me to continue caring for you. It means a lot to me.  Call Dr. Cyndy Freeze to reassess your back pain.  I will release your lab results to you on your MyChart account with further instructions. Please reply with any questions.   Please schedule a follow up appointment with me in 3 months to recheck back pain.  Please do these things to maintain good health!   Exercise at least 30-45 minutes a day,  4-5 days a week.   Eat a low-fat diet with lots of fruits and vegetables, up to 7-9 servings per day.  Drink plenty of water daily. Try to drink 8 8oz glasses per day.  Seatbelts can save your life. Always wear your seatbelt.  Place Smoke Detectors on every level of your home and check batteries every year.  Schedule an appointment with an eye doctor for an eye exam every 1-2 years  Safe sex - use condoms to protect yourself from STDs if you could be exposed to these types of infections. Use birth control if you do not want to become pregnant and are sexually active.  Avoid heavy alcohol use. If you drink, keep it to less than 2 drinks/day and not every day.  [place].  Choose someone you trust that could speak for you if you became unable to speak for yourself.  Depression is common in our stressful world.If you're feeling down or losing interest in things you normally enjoy, please come in for a visit.  If anyone is threatening or hurting you, please get help. Physical or Emotional Violence is never OK.   Back Exercises The following exercises strengthen the muscles that help to support the back. They also help to keep the lower back flexible. Doing these exercises can help to prevent back pain or lessen existing pain. If you have back pain or discomfort, try doing these exercises 2-3 times each day or as told by your health care provider. When the pain goes away, do them once  each day, but increase the number of times that you repeat the steps for each exercise (do more repetitions). If you do not have back pain or discomfort, do these exercises once each day or as told by your health care provider. Exercises Single Knee to Chest  Repeat these steps 3-5 times for each leg: 1. Lie on your back on a firm bed or the floor with your legs extended. 2. Bring one knee to your chest. Your other leg should stay extended and in contact with the floor. 3. Hold your knee in place by grabbing your knee or thigh. 4. Pull on your knee until you feel a gentle stretch in your lower back. 5. Hold the stretch for 10-30 seconds. 6. Slowly release and straighten your leg.  Pelvic Tilt  Repeat these steps 5-10 times: 1. Lie on your back on a firm bed or the floor with your legs extended. 2. Bend your knees so they are pointing toward the ceiling and your feet are flat on the floor. 3. Tighten your lower abdominal muscles to press your lower back against the floor. This motion will tilt your pelvis so your tailbone points up toward the ceiling instead of pointing to your feet or the floor. 4. With gentle tension and even breathing, hold this position for 5-10 seconds.  Cat-Cow  Repeat these steps until your lower back becomes  more flexible: 1. Get into a hands-and-knees position on a firm surface. Keep your hands under your shoulders, and keep your knees under your hips. You may place padding under your knees for comfort. 2. Let your head hang down, and point your tailbone toward the floor so your lower back becomes rounded like the back of a cat. 3. Hold this position for 5 seconds. 4. Slowly lift your head and point your tailbone up toward the ceiling so your back forms a sagging arch like the back of a cow. 5. Hold this position for 5 seconds.  Press-Ups  Repeat these steps 5-10 times: 1. Lie on your abdomen (face-down) on the floor. 2. Place your palms near your head, about  shoulder-width apart. 3. While you keep your back as relaxed as possible and keep your hips on the floor, slowly straighten your arms to raise the top half of your body and lift your shoulders. Do not use your back muscles to raise your upper torso. You may adjust the placement of your hands to make yourself more comfortable. 4. Hold this position for 5 seconds while you keep your back relaxed. 5. Slowly return to lying flat on the floor.  Bridges  Repeat these steps 10 times: 1. Lie on your back on a firm surface. 2. Bend your knees so they are pointing toward the ceiling and your feet are flat on the floor. 3. Tighten your buttocks muscles and lift your buttocks off of the floor until your waist is at almost the same height as your knees. You should feel the muscles working in your buttocks and the back of your thighs. If you do not feel these muscles, slide your feet 1-2 inches farther away from your buttocks. 4. Hold this position for 3-5 seconds. 5. Slowly lower your hips to the starting position, and allow your buttocks muscles to relax completely.  If this exercise is too easy, try doing it with your arms crossed over your chest. Abdominal Crunches  Repeat these steps 5-10 times: 1. Lie on your back on a firm bed or the floor with your legs extended. 2. Bend your knees so they are pointing toward the ceiling and your feet are flat on the floor. 3. Cross your arms over your chest. 4. Tip your chin slightly toward your chest without bending your neck. 5. Tighten your abdominal muscles and slowly raise your trunk (torso) high enough to lift your shoulder blades a tiny bit off of the floor. Avoid raising your torso higher than that, because it can put too much stress on your low back and it does not help to strengthen your abdominal muscles. 6. Slowly return to your starting position.  Back Lifts Repeat these steps 5-10 times: 1. Lie on your abdomen (face-down) with your arms at your  sides, and rest your forehead on the floor. 2. Tighten the muscles in your legs and your buttocks. 3. Slowly lift your chest off of the floor while you keep your hips pressed to the floor. Keep the back of your head in line with the curve in your back. Your eyes should be looking at the floor. 4. Hold this position for 3-5 seconds. 5. Slowly return to your starting position.  Contact a health care provider if:  Your back pain or discomfort gets much worse when you do an exercise.  Your back pain or discomfort does not lessen within 2 hours after you exercise. If you have any of these problems, stop doing these  exercises right away. Do not do them again unless your health care provider says that you can. Get help right away if:  You develop sudden, severe back pain. If this happens, stop doing the exercises right away. Do not do them again unless your health care provider says that you can. This information is not intended to replace advice given to you by your health care provider. Make sure you discuss any questions you have with your health care provider. Document Released: 03/25/2004 Document Revised: 06/25/2015 Document Reviewed: 04/11/2014 Elsevier Interactive Patient Education  2017 [employer].

## 2017-02-18 ENCOUNTER — Other Ambulatory Visit: Payer: Self-pay | Admitting: Family Medicine

## 2017-02-18 LAB — HIV ANTIBODY (ROUTINE TESTING W REFLEX): HIV: NONREACTIVE

## 2017-02-18 MED ORDER — VITAMIN D (ERGOCALCIFEROL) 1.25 MG (50000 UNIT) PO CAPS: 50000.0000 [IU] | ORAL_CAPSULE | ORAL | 0 refills | Status: DC

## 2017-02-18 MED FILL — VIT D2 1.25 MG (50,000 UNIT: 1.25 MG | 84 days supply | Qty: 12 | Fill #0

## 2017-02-18 NOTE — Progress Notes (Signed)
Replace vit D.

## 2017-02-21 ENCOUNTER — Encounter: Payer: Self-pay | Admitting: Family Medicine

## 2017-02-21 MED ORDER — LEVOTHYROXINE SODIUM 75 MCG PO TABS: 75.0000 ug | ORAL_TABLET | Freq: Every day | ORAL | 1 refills | Status: DC

## 2017-02-21 MED ORDER — ZOLPIDEM TARTRATE 5 MG PO TABS: 5.0000 mg | ORAL_TABLET | Freq: Every evening | ORAL | 2 refills | Status: DC | PRN

## 2017-02-21 MED FILL — LEVOTHYROXINE 75 MCG TABLET: 75 | 90 days supply | Qty: 90 | Fill #0

## 2017-02-21 MED FILL — ZOLPIDEM TARTRATE 5 MG TABL: 5 | 30 days supply | Qty: 30 | Fill #0

## 2017-02-21 NOTE — Telephone Encounter (Signed)
Dr. Jonni Sanger, I sent Rx for Levothyroxine if you could send Rx for Ambien if appropriate. Thanks

## 2017-03-06 ENCOUNTER — Ambulatory Visit (INDEPENDENT_AMBULATORY_CARE_PROVIDER_SITE_OTHER): Payer: Self-pay | Admitting: Family Medicine

## 2017-03-06 VITALS — BP 140/88 | HR 86 | Temp 98.0°F | Wt 203.6 lb

## 2017-03-06 DIAGNOSIS — J069 Acute upper respiratory infection, unspecified: Secondary | ICD-10-CM

## 2017-03-06 MED ORDER — AMOXICILLIN-POT CLAVULANATE 875-125 MG PO TABS: 1.0000 | ORAL_TABLET | Freq: Two times a day (BID) | ORAL | 0 refills | Status: DC

## 2017-03-06 NOTE — Patient Instructions (Signed)
I recommend warm water salt gargles 2-3 times daily. Resume cetirizine 10 mg at bedtime to resolve post nasal drip.  If no improvement, you fill prescription for Augmentin 1 tablet twice daily x 10 days.    Sore Throat When you have a sore throat, your throat may:  Hurt.  Burn.  Feel irritated.  Feel scratchy.  Many things can cause a sore throat, including:  An infection.  Allergies.  Dryness in the air.  Smoke or pollution.  Gastroesophageal reflux disease (GERD).  A tumor.  A sore throat can be the first sign of another sickness. It can happen with other problems, like coughing or a fever. Most sore throats go away without treatment. Follow these instructions at home:  Take over-the-counter medicines only as told by your doctor.  Drink enough fluids to keep your pee (urine) clear or pale yellow.  Rest when you feel you need to.  To help with pain, try: ? Sipping warm liquids, such as broth, herbal tea, or warm water. ? Eating or drinking cold or frozen liquids, such as frozen ice pops. ? Gargling with a salt-water mixture 3-4 times a day or as needed. To make a salt-water mixture, add -1 tsp of salt in 1 cup of warm water. Mix it until you cannot see the salt anymore. ? Sucking on hard candy or throat lozenges. ? Putting a cool-mist humidifier in your bedroom at night. ? Sitting in the bathroom with the door closed for 5-10 minutes while you run hot water in the shower.  Do not use any tobacco products, such as cigarettes, chewing tobacco, and e-cigarettes. If you need help quitting, ask your doctor. Contact a doctor if:  You have a fever for more than 2-3 days.  You keep having symptoms for more than 2-3 days.  Your throat does not get better in 7 days.  You have a fever and your symptoms suddenly get worse. Get help right away if:  You have trouble breathing.  You cannot swallow fluids, soft foods, or your saliva.  You have swelling in your throat  or neck that gets worse.  You keep feeling like you are going to throw up (vomit).  You keep throwing up. This information is not intended to replace advice given to you by your health care provider. Make sure you discuss any questions you have with your health care provider. Document Released: 11/25/2007 Document Revised: 10/12/2015 Document Reviewed: 12/06/2014 Elsevier Interactive Patient Education  2018 [employer].  Viral Respiratory Infection A viral respiratory infection is an illness that affects parts of the body used for breathing, like the lungs, nose, and throat. It is caused by a germ called a virus. Some examples of this kind of infection are:  A cold.  The flu (influenza).  A respiratory syncytial virus (RSV) infection.  How do I know if I have this infection? Most of the time this infection causes:  A stuffy or runny nose.  Yellow or green fluid in the nose.  A cough.  Sneezing.  Tiredness (fatigue).  Achy muscles.  A sore throat.  Sweating or chills.  A fever.  A headache.  How is this infection treated? If the flu is diagnosed early, it may be treated with an antiviral medicine. This medicine shortens the length of time a person has symptoms. Symptoms may be treated with over-the-counter and prescription medicines, such as:  Expectorants. These make it easier to cough up mucus.  Decongestant nasal sprays.  Doctors do not  prescribe antibiotic medicines for viral infections. They do not work with this kind of infection. How do I know if I should stay home? To keep others from getting sick, stay home if you have:  A fever.  A lasting cough.  A sore throat.  A runny nose.  Sneezing.  Muscles aches.  Headaches.  Tiredness.  Weakness.  Chills.  Sweating.  An upset stomach (nausea).  Follow these instructions at home:  Rest as much as possible.  Take over-the-counter and prescription medicines only as told by your  doctor.  Drink enough fluid to keep your pee (urine) clear or pale yellow.  Gargle with salt water. Do this 3-4 times per day or as needed. To make a salt-water mixture, dissolve -1 tsp of salt in 1 cup of warm water. Make sure the salt dissolves all the way.  Use nose drops made from salt water. This helps with stuffiness (congestion). It also helps soften the skin around your nose.  Do not drink alcohol.  Do not use tobacco products, including cigarettes, chewing tobacco, and e-cigarettes. If you need help quitting, ask your doctor. Get help if:  Your symptoms last for 10 days or longer.  Your symptoms get worse over time.  You have a fever.  You have very bad pain in your face or forehead.  Parts of your jaw or neck become very swollen. Get help right away if:  You feel pain or pressure in your chest.  You have shortness of breath.  You faint or feel like you will faint.  You keep throwing up (vomiting).  You feel confused. This information is not intended to replace advice given to you by your health care provider. Make sure you discuss any questions you have with your health care provider. Document Released: 01/29/2008 Document Revised: 07/24/2015 Document Reviewed: 07/24/2014 Elsevier Interactive Patient Education  2018 [employer].

## 2017-03-06 NOTE — Progress Notes (Signed)
Subjective:  Holly Riley is a 62 y.o. female who presents for evaluation of URI like symptoms.  Symptoms include: sore throat, right ear, mild cough (nonproductive), subjective fever, and fatigue.   Onset of symptoms was 2 days ago, and has been progressively worsening since that time.  Treatment to date has only consisted of rest, hydration, and ibuprofen.  She denies shortness of breath, chest pain, hoarseness, wheezing, nausea, diarrhea, and vomiting.   The following portions of the patient's history were reviewed and updated as appropriate:  allergies, current medications and past medical history.  Ears, nose, mouth, throat, and face: positive for sore throat, tinnitus and ear pain Respiratory: positive for cough Cardiovascular: negative Gastrointestinal: negative Allergic/Immunologic: positive for seasonal allergic rhinitis  Objective:  BP 140/88 (BP Location: Right Arm, Patient Position: Sitting, Cuff Size: Large)   Pulse 86   Temp 98 F (36.7 C) (Oral)   Wt 203 lb 9.6 oz (92.4 kg)   BMI 33.37 kg/m  General appearance: alert, cooperative and no distress Eyes: negative findings: pupils equal, round, reactive to light and accomodation Ears: normal TM's and external ear canals both ears and right ear erous effusion noted. landmarks viewable. negative erythema, tenderness Nose: Nares normal. Septum midline. Mucosa normal. No drainage or sinus tenderness., mild swelling Throat: normal findings: oropharynx pink & moist without lesions or evidence of thrush Lungs: clear to auscultation bilaterally Skin: Skin color, texture, turgor normal. No rashes or lesions Lymph nodes: Cervical nodes normal.    Assessment:  viral upper respiratory illness, most consistent with presentation. Explained that initiating antibiotic therapy would likely prove to be ineffective as illness of likely viral. 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 For now will  treat symptomatically with conservative therapy.      Plan:  Suggested symptomatic OTC remedies. Supportive care with appropriate warm salt water gargles, naproxen 500 mg twice daily, and  Fluids. Patient has Naproxen at home. If no improvement over the course of 3-5 days, printed prescription provided for Augmentin 875-125 mg twice daily x 10 days.   -The patient was given clear instructions to go to ER or return to medical center if symptoms do not improve, worsen or new problems develop. The patient verbalized understanding.  Carroll Sage. Kenton Kingfisher, MSN, FNP-C [address] [place]  [place], [place] [address] [phone]

## 2017-03-08 ENCOUNTER — Telehealth: Payer: Self-pay

## 2017-03-17 ENCOUNTER — Encounter: Payer: Self-pay | Admitting: Family Medicine

## 2017-03-20 ENCOUNTER — Encounter: Payer: Self-pay | Admitting: Family Medicine

## 2017-03-20 DIAGNOSIS — Z789 Other specified health status: Secondary | ICD-10-CM | POA: Insufficient documentation

## 2017-03-20 DIAGNOSIS — E559 Vitamin D deficiency, unspecified: Secondary | ICD-10-CM | POA: Insufficient documentation

## 2017-05-03 DIAGNOSIS — H524 Presbyopia: Secondary | ICD-10-CM | POA: Diagnosis not present

## 2017-05-03 DIAGNOSIS — H5203 Hypermetropia, bilateral: Secondary | ICD-10-CM | POA: Diagnosis not present

## 2017-05-03 DIAGNOSIS — H52203 Unspecified astigmatism, bilateral: Secondary | ICD-10-CM | POA: Diagnosis not present

## 2017-05-06 DIAGNOSIS — D2371 Other benign neoplasm of skin of right lower limb, including hip: Secondary | ICD-10-CM | POA: Diagnosis not present

## 2017-05-06 DIAGNOSIS — D2262 Melanocytic nevi of left upper limb, including shoulder: Secondary | ICD-10-CM | POA: Diagnosis not present

## 2017-05-06 DIAGNOSIS — Z85828 Personal history of other malignant neoplasm of skin: Secondary | ICD-10-CM | POA: Diagnosis not present

## 2017-05-06 DIAGNOSIS — D224 Melanocytic nevi of scalp and neck: Secondary | ICD-10-CM | POA: Diagnosis not present

## 2017-05-06 DIAGNOSIS — L821 Other seborrheic keratosis: Secondary | ICD-10-CM | POA: Diagnosis not present

## 2017-05-06 DIAGNOSIS — D2271 Melanocytic nevi of right lower limb, including hip: Secondary | ICD-10-CM | POA: Diagnosis not present

## 2017-05-06 DIAGNOSIS — D2372 Other benign neoplasm of skin of left lower limb, including hip: Secondary | ICD-10-CM | POA: Diagnosis not present

## 2017-05-06 DIAGNOSIS — D225 Melanocytic nevi of trunk: Secondary | ICD-10-CM | POA: Diagnosis not present

## 2017-05-17 ENCOUNTER — Encounter: Payer: Self-pay | Admitting: Family Medicine

## 2017-05-17 ENCOUNTER — Ambulatory Visit: Payer: 59 | Admitting: Family Medicine

## 2017-05-17 ENCOUNTER — Other Ambulatory Visit: Payer: Self-pay

## 2017-05-17 ENCOUNTER — Other Ambulatory Visit: Payer: Self-pay | Admitting: Family Medicine

## 2017-05-17 VITALS — BP 120/84 | HR 86 | Temp 97.7°F | Ht 65.5 in | Wt 207.0 lb

## 2017-05-17 DIAGNOSIS — M79604 Pain in right leg: Secondary | ICD-10-CM

## 2017-05-17 DIAGNOSIS — E559 Vitamin D deficiency, unspecified: Secondary | ICD-10-CM | POA: Diagnosis not present

## 2017-05-17 DIAGNOSIS — M79605 Pain in left leg: Secondary | ICD-10-CM | POA: Diagnosis not present

## 2017-05-17 DIAGNOSIS — E119 Type 2 diabetes mellitus without complications: Secondary | ICD-10-CM | POA: Diagnosis not present

## 2017-05-17 DIAGNOSIS — M791 Myalgia, unspecified site: Secondary | ICD-10-CM | POA: Diagnosis not present

## 2017-05-17 DIAGNOSIS — E782 Mixed hyperlipidemia: Secondary | ICD-10-CM

## 2017-05-17 DIAGNOSIS — T466X5A Adverse effect of antihyperlipidemic and antiarteriosclerotic drugs, initial encounter: Secondary | ICD-10-CM

## 2017-05-17 DIAGNOSIS — E1169 Type 2 diabetes mellitus with other specified complication: Secondary | ICD-10-CM | POA: Diagnosis not present

## 2017-05-17 DIAGNOSIS — E669 Obesity, unspecified: Secondary | ICD-10-CM

## 2017-05-17 LAB — VITAMIN D 25 HYDROXY (VIT D DEFICIENCY, FRACTURES): VITD: 27.41 ng/mL — ABNORMAL LOW (ref 30.00–100.00)

## 2017-05-17 LAB — HEMOGLOBIN A1C: Hgb A1c MFr Bld: 6.4 % (ref 4.6–6.5)

## 2017-05-17 MED ORDER — VITAMIN D (ERGOCALCIFEROL) 1.25 MG (50000 UNIT) PO CAPS: 50000.0000 [IU] | ORAL_CAPSULE | ORAL | 0 refills | Status: AC

## 2017-05-17 MED ORDER — ROSUVASTATIN CALCIUM 5 MG PO TABS: 5.0000 mg | ORAL_TABLET | ORAL | 3 refills | Status: DC

## 2017-05-17 MED FILL — ZOLPIDEM TARTRATE 5 MG TABL: 5 | 30 days supply | Qty: 30 | Fill #1

## 2017-05-17 MED FILL — VIT D2 1.25 MG (50,000 UNIT: 1.25 MG | 56 days supply | Qty: 8 | Fill #0

## 2017-05-17 NOTE — Progress Notes (Signed)
Subjective  CC:  Chief Complaint  Patient presents with  . Follow-up    Leg & Back Pain, Patient has been taking Vitamin D- once per week     HPI: Holly Riley is a 62 y.o. female who presents to the office today to address the problems listed above in the chief complaint.  Leg pain f/u: last visit had > 45months of bilateral leg pain: dxd with Vit D deficiency and possible statin induced myalgias. Since, she has been on vit D replacement and held crestor 5; after 3 weeks, she was pain free. 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   DM: control is reportedly good. Feels well. No sxs of hyperglycemia. No foot concerns.  Lab Results  Component Value Date   HGBA1C 6.5 02/17/2017   HGBA1C 5.8 10/28/2016     Obesity: gaining weight. Discussed her eating patterns: undereating on days she works. Eats "snacks" - peanut butter and crackers, cheese sticks, fast food salads etc. Poor sleep since works nights in ER. No exercise outside of work.   Lipids are at goal but hasn't tolerated statin due to myalgias.   I reviewed the patients updated PMH, FH, and SocHx. Wt Readings from Last 3 Encounters:  05/17/17 207 lb (93.9 kg)  03/06/17 203 lb 9.6 oz (92.4 kg)  02/17/17 202 lb (91.6 kg)   Lab Results  Component Value Date   HGBA1C 6.5 02/17/2017   HGBA1C 5.8 10/28/2016     Patient Active Problem List   Diagnosis Date Noted  . Controlled type 2 diabetes mellitus without complication, without long-term current use of insulin ([place]) 02/17/2017    Priority: High  . Post-menopause on HRT (hormone replacement therapy) 09/11/2013    Priority: High  . Hypothyroidism 03/26/2013    Priority: High  . Obesity (BMI 30.0-34.9) 01/22/2013    Priority: High  . 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 Insomnia 09/11/2013    Priority: Medium  . Osteoarthritis 04/28/2011    Priority: Medium  . Allergic rhinitis 11/25/2009    Priority:  Low  . Statin intolerance 03/20/2017  . Vitamin D deficiency 03/20/2017  . Combined hyperlipidemia associated with type 2 diabetes mellitus ([place]) 02/17/2017  . Spinal stenosis    Current Meds  Medication Sig  . Cinnamon 500 MG TABS Take 500 mg by mouth daily.  Marland Kitchen co-enzyme Q-10 30 MG capsule Take 30 mg by mouth daily.   Marland Kitchen estradiol (ESTRACE) 0.5 MG tablet Take 0.5 mg by mouth daily.  . Flaxseed, Linseed, (FLAX SEED OIL PO) Take 1 tablet by mouth daily.  . Garlic 10 MG CAPS Take 10 mg by mouth daily.  Marland Kitchen levothyroxine (SYNTHROID, LEVOTHROID) 75 MCG tablet Take 1 tablet (75 mcg total) by mouth daily.  Marland Kitchen MELATONIN PO Take 10 mg by mouth as needed.   . Omega-3 Fatty Acids (FISH OIL CONCENTRATE) 1000 MG CAPS Take 1,000 mg by mouth daily.  . Red Yeast Rice Extract 600 MG CAPS Take by mouth.  . TURMERIC PO Take by mouth.  . Vitamin D, Ergocalciferol, (DRISDOL) 50000 units CAPS capsule Take 1 capsule (50,000 Units total) by mouth once a week.  . zolpidem (AMBIEN) 5 MG tablet Take 1 tablet (5 mg total) by mouth at bedtime as needed for sleep.    Allergies: Patient is allergic to morphine and related; statins; percocet [oxycodone-acetaminophen]; and vicodin [hydrocodone-acetaminophen]. Family History: Patient family history includes Cancer in  her father, mother, and sister; Diabetes in her brother, father, and sister; Heart disease in her father; Hyperlipidemia in her father, mother, sister, and sister. Social History:  Patient  reports that  has never smoked. she has never used smokeless tobacco. She reports that she does not drink alcohol or use drugs.  Review of Systems: Constitutional: Negative for fever malaise or anorexia Cardiovascular: negative for chest pain Respiratory: negative for SOB or persistent cough Gastrointestinal: negative for abdominal pain  Objective  Vitals: BP 120/84   Pulse 86   Temp 97.7 F (36.5 C)   Ht 5' 5.5" (1.664 m)   Wt 207 lb (93.9 kg)   BMI 33.92 kg/m   General: no acute distress , A&Ox3 Cardiovascular:  RRR without murmur or gallop.  Respiratory:  Good breath sounds bilaterally, CTAB with normal respiratory effort Skin:  Warm, no rashes MSK: no edema. No mm tenderness in legs.   Assessment  1. Pain in both lower extremities   2. Vitamin D deficiency   3. Myalgia due to statin   4. Controlled type 2 diabetes mellitus without complication, without long-term current use of insulin ([place])   5. Combined hyperlipidemia associated with type 2 diabetes mellitus ([facility]) Chronic  6. Obesity (BMI 30.0-34.9)      Plan   Vit D deficiency. Recheck levels, then recommend daily replacement dosing. Could be contributing to leg pain but suspect statin is more of the culprit  Statin intolerance/myalgia: start once weekly and titrate to twice weekly to see if will tolerate. Will repeat lipid panel in 6 months.   DM - await a1c. Control should be at goal.   Obesity: discussed need to make behavior changes and food choice changes. Pt unlikely to change at this time; she is in "waiting for retirement" mode. Will continue to counsel.   Follow up: 3 months to recheck diabetes.     Commons side effects, risks, benefits, and alternatives for medications and treatment plan prescribed today were discussed, and the patient expressed understanding of the given instructions. Patient is instructed to call or message via MyChart if he/she has any questions or concerns regarding our treatment plan. No barriers to understanding were identified. We discussed Red Flag symptoms and signs in detail. Patient expressed understanding regarding what to do in case of urgent or emergency type symptoms.   Medication list was reconciled, printed and provided to the patient in AVS. Patient instructions and summary information was reviewed with the patient as documented in the AVS. This note was prepared with assistance of Dragon voice recognition software. Occasional wrong-word or  sound-a-like substitutions may have occurred due to the inherent limitations of voice recognition software  Orders Placed This Encounter  Procedures  . VITAMIN D 25 Hydroxy (Vit-D Deficiency, Fractures)  . Hemoglobin A1c   Meds ordered this encounter  Medications  . rosuvastatin (CRESTOR) 5 MG tablet    Sig: Take 1 tablet (5 mg total) by mouth 2 (two) times a week.    Dispense:  90 tablet    Refill:  3

## 2017-05-17 NOTE — Patient Instructions (Signed)
Please return in 3 months for diabetes recheck and weight.   If you have any questions or concerns, please don't hesitate to send me a message via MyChart or call the office at [phone]. Thank you for visiting with [place] today! It's our pleasure caring for you.

## 2017-05-22 MED FILL — LEVOTHYROXINE 75 MCG TABLET: 75 | 90 days supply | Qty: 90 | Fill #1

## 2017-07-26 MED FILL — ESTRADIOL 0.5 MG TABS: 0.5 | 90 days supply | Qty: 180 | Fill #1

## 2017-07-26 MED FILL — ZOLPIDEM TARTRATE 5 MG TAB: 5 | 30 days supply | Qty: 30 | Fill #2

## 2017-08-11 ENCOUNTER — Encounter: Payer: Self-pay | Admitting: Family Medicine

## 2017-08-11 ENCOUNTER — Other Ambulatory Visit: Payer: Self-pay

## 2017-08-11 ENCOUNTER — Ambulatory Visit: Payer: 59 | Admitting: Family Medicine

## 2017-08-11 VITALS — BP 112/78 | HR 66 | Temp 97.8°F | Resp 15 | Ht 65.5 in | Wt 202.6 lb

## 2017-08-11 DIAGNOSIS — E119 Type 2 diabetes mellitus without complications: Secondary | ICD-10-CM | POA: Diagnosis not present

## 2017-08-11 DIAGNOSIS — E1169 Type 2 diabetes mellitus with other specified complication: Secondary | ICD-10-CM

## 2017-08-11 DIAGNOSIS — E782 Mixed hyperlipidemia: Secondary | ICD-10-CM | POA: Diagnosis not present

## 2017-08-11 DIAGNOSIS — Z0000 Encounter for general adult medical examination without abnormal findings: Secondary | ICD-10-CM | POA: Diagnosis not present

## 2017-08-11 LAB — POCT GLYCOSYLATED HEMOGLOBIN (HGB A1C): Hemoglobin A1C: 6 % — AB (ref 4.0–5.6)

## 2017-08-11 NOTE — Progress Notes (Signed)
Subjective  Chief Complaint  Patient presents with  . Diabetes    Doing well  . Hyperlipidemia  . Annual Exam    HPI: Holly Riley is a 62 y.o. female who presents to [place] at [facility] today for a Female Wellness Visit. She also has the concerns and/or needs as listed above in the chief complaint. These will be addressed in addition to the Health Maintenance Visit.   Wellness Visit: annual visit with health maintenance review and exam without Pap   Health maintenance: Up-to-date.  Physically doing fine.  Continues to work full-time in the [place].  Hoping to retire next year.  No new concerns today Chronic disease f/u and/or acute problem visit: (deemed necessary to be done in addition to the wellness visit):  Diet controlled diabetes: eats healthy. No sxs of hyperglycemia.  No foot paresthesias or sores.  Diet is balanced although she will skip many meals when she works.  Hyperlipidemia on statin twice weekly due to myalgias.  No myalgias.  Due for recheck.  Assessment  1. Annual physical exam   2. Controlled type 2 diabetes mellitus without complication, without long-term current use of insulin ([place])   3. Combined hyperlipidemia associated with type 2 diabetes mellitus [facility])      Plan  Female Wellness Visit:  Age appropriate Health Maintenance and Prevention measures were discussed with patient. Included topics are cancer screening recommendations, ways to keep healthy (see AVS) including dietary and exercise recommendations, regular eye and dental care, use of seat belts, and avoidance of moderate alcohol use and tobacco use.   BMI: discussed patient's BMI and encouraged positive lifestyle modifications to help get to or maintain a target BMI.  HM needs and immunizations were addressed and ordered. See below for orders. See HM and immunization section for updates.  Routine labs and screening tests ordered including cmp, cbc and lipids where  appropriate.  Discussed recommendations regarding Vit D and calcium supplementation (see AVS)  Chronic disease management visit and/or acute problem visit:  Diet-controlled diabetes remains well controlled.  Hyperlipidemia due for recheck today.  Nonfasting state.  Check LFTs. Follow up: No follow-ups on file.  Orders Placed This Encounter  Procedures  . POCT glycosylated hemoglobin (Hb A1C)   No orders of the defined types were placed in this encounter.     Lifestyle: Body mass index is 33.2 kg/m. Wt Readings from Last 3 Encounters:  08/11/17 202 lb 9.6 oz (91.9 kg)  05/17/17 207 lb (93.9 kg)  03/06/17 203 lb 9.6 oz (92.4 kg)   Diet: general Exercise: intermittently,   Patient Active Problem List   Diagnosis Date Noted  . Controlled type 2 diabetes mellitus without complication, without long-term current use of insulin ([name]) 02/17/2017    Priority: High  . Post-menopause on HRT (hormone replacement therapy) 09/11/2013    Priority: High  . Hypothyroidism 03/26/2013    Priority: High  . Obesity (BMI 30.0-34.9) 01/22/2013    Priority: High  . 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 Insomnia 09/11/2013    Priority: Medium  . Osteoarthritis 04/28/2011    Priority: Medium  . Allergic rhinitis 11/25/2009    Priority: Low  . Statin intolerance 03/20/2017    Myalgias, simvistatin and crestor   . Vitamin D deficiency 03/20/2017  . Combined hyperlipidemia associated with type 2 diabetes mellitus ([name]) 02/17/2017  . Spinal stenosis     NS - early, MRI    Health Maintenance  Topic Date Due  . INFLUENZA VACCINE  09/29/2017  . MAMMOGRAM  11/05/2017  . HEMOGLOBIN A1C  11/17/2017  . FOOT EXAM  02/17/2018  . URINE MICROALBUMIN  02/17/2018  . OPHTHALMOLOGY EXAM  04/30/2018  . PNEUMOCOCCAL POLYSACCHARIDE VACCINE (2) 08/27/2019  . COLONOSCOPY  06/21/2020  . TETANUS/TDAP  10/18/2024  . Hepatitis C Screening  Completed  . HIV Screening  Completed    Immunization History  Administered Date(s) Administered  . DTaP 04/27/2006  . Hepatitis B, ped/adol 04/28/1991  . Influenza Split 12/26/2007  . Influenza, Quadrivalent, Recombinant, Inj, Pf 01/22/2013  . Influenza, Seasonal, Injecte, Preservative Fre 12/13/2013, 11/20/2014  . Influenza,inj,Quad PF,6+ Mos 12/17/2015  . Influenza-Unspecified 12/14/2016  . Pneumococcal Polysaccharide-23 08/27/2014  . Tdap 10/19/2014   We updated and reviewed the patient's past history in detail and it is documented below. Allergies: Patient is allergic to morphine and related; statins; percocet [oxycodone-acetaminophen]; and vicodin [hydrocodone-acetaminophen]. Past Medical History Patient  has a past medical history of Arthritis, Hyperlipidemia, Spinal stenosis, Thyroid disease, and Type 2 diabetes mellitus with hyperlipidemia ([place]). Past Surgical History Patient  has a past surgical history that includes Abdominal hysterectomy; Carpal tunnel release (Right); Knee surgery (Left); Tonsillectomy and adenoidectomy; Spine surgery; Cervical fusion; and Lumbar laminectomy. Family History: Patient family history includes Cancer in her father, mother, and sister; Diabetes in her brother, father, and sister; Heart disease in her father; Hyperlipidemia in her father, mother, sister, and sister. Social History:  Patient  reports that she has never smoked. She has never used smokeless tobacco. She reports that she does not drink alcohol or use drugs.  Review of Systems: Constitutional: negative for fever or malaise Ophthalmic: negative for photophobia, double vision or loss of vision Cardiovascular: negative for chest pain, dyspnea on exertion, or new LE swelling Respiratory: negative for SOB or persistent cough Gastrointestinal: negative for abdominal pain, change in bowel habits or melena Genitourinary: negative for dysuria or gross hematuria, no abnormal uterine bleeding or disharge Musculoskeletal: negative  for new gait disturbance or muscular weakness Integumentary: negative for new or persistent rashes, no breast lumps Neurological: negative for TIA or stroke symptoms Psychiatric: negative for SI or delusions Allergic/Immunologic: negative for hives  Patient Care Team    Relationship Specialty Notifications Start End  Leamon Arnt, MD PCP - General Family Medicine  12/20/14   Ashok Pall, MD Consulting Physician Neurosurgery  02/17/17   Marygrace Drought, MD Consulting Physician Ophthalmology  05/17/17     Objective  Vitals: BP 112/78   Pulse 66   Temp 97.8 F (36.6 C) (Oral)   Resp 15   Ht 5' 5.5" (1.664 m)   Wt 202 lb 9.6 oz (91.9 kg)   SpO2 97%   BMI 33.20 kg/m  General:  Well developed, well nourished, no acute distress  Psych:  Alert and orientedx3,normal mood and affect HEENT:  Normocephalic, atraumatic, non-icteric sclera, PERRL, oropharynx is clear without mass or exudate, supple neck without adenopathy, mass or thyromegaly Cardiovascular:  Normal S1, S2, RRR without gallop, rub or murmur, nondisplaced PMI Respiratory:  Good breath sounds bilaterally, CTAB with normal respiratory effort Gastrointestinal: normal bowel sounds, soft, non-tender, no noted masses. No HSM MSK: no deformities, contusions. Joints are without erythema or swelling. Spine and CVA region are nontender Skin:  Warm, no rashes or suspicious lesions noted Neurologic:    Mental status is normal. CN 2-11 are normal. Gross motor and sensory exams are normal. Normal gait. No tremor Breast Exam: No mass, skin retraction or  nipple discharge is appreciated in either breast. No axillary adenopathy. Fibrocystic changes are not noted Diabetic Foot Exam: Appearance - no lesions, ulcers or calluses Skin - no sigificant pallor or erythema Monofilament testing - sensitive bilaterally in following locations:  Right - Great toe, medial, central, lateral ball and posterior foot intact  Left - Great toe, medial,  central, lateral ball and posterior foot intact Pulses - +2 distally bilaterally   Commons side effects, risks, benefits, and alternatives for medications and treatment plan prescribed today were discussed, and the patient expressed understanding of the given instructions. Patient is instructed to call or message via MyChart if he/she has any questions or concerns regarding our treatment plan. No barriers to understanding were identified. We discussed Red Flag symptoms and signs in detail. Patient expressed understanding regarding what to do in case of urgent or emergency type symptoms.   Medication list was reconciled, printed and provided to the patient in AVS. Patient instructions and summary information was reviewed with the patient as documented in the AVS. This note was prepared with assistance of Dragon voice recognition software. Occasional wrong-word or sound-a-like substitutions may have occurred due to the inherent limitations of voice recognition software

## 2017-08-11 NOTE — Patient Instructions (Addendum)
Please return in 6 months for recheck of cholesterol and diabetes. Please come fasting.  Continue crestor twice weekly.   If you have any questions or concerns, please don't hesitate to send me a message via MyChart or call the office at [phone]. Thank you for visiting with [place] today! It's our pleasure caring for you.   Please do these things to maintain good health!   Exercise at least 30-45 minutes a day,  4-5 days a week.   Eat a low-fat diet with lots of fruits and vegetables, up to 7-9 servings per day.  Drink plenty of water daily. Try to drink 8 8oz glasses per day.  Seatbelts can save your life. Always wear your seatbelt.  Place Smoke Detectors on every level of your home and check batteries every year.  Schedule an appointment with an eye doctor for an eye exam every 1-2 years  Safe sex - use condoms to protect yourself from STDs if you could be exposed to these types of infections. Use birth control if you do not want to become pregnant and are sexually active.  Avoid heavy alcohol use. If you drink, keep it to less than 2 drinks/day and not every day.  [place].  Choose someone you trust that could speak for you if you became unable to speak for yourself.  Depression is common in our stressful world.If you're feeling down or losing interest in things you normally enjoy, please come in for a visit.  If anyone is threatening or hurting you, please get help. Physical or Emotional Violence is never OK.

## 2017-08-15 ENCOUNTER — Other Ambulatory Visit: Payer: Self-pay | Admitting: Family Medicine

## 2017-08-15 MED FILL — LEVOTHYROXINE 75 MCG TABLET: 75 | 90 days supply | Qty: 90 | Fill #0

## 2017-10-10 IMAGING — NM NM MISC PROCEDURE
3 series · 18 of 18 positions shown · non-contrast
Comparison: none

[Series 1: wbr_s-proj_st stress_(id)_sa · 6.5mm · 6.51mm/px · 6 of 64 frames shown (1 of 2)]
[frame 6/64]
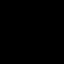
[frame 16/64]
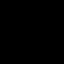
[frame 27/64]
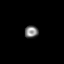
[frame 38/64]
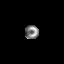
[frame 48/64]
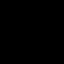
[frame 59/64]
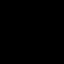

[Series 1: wbr_r-proj_st rest_(id)_sa · 6.5mm · 6.51mm/px · 6 of 64 frames shown]
[frame 6/64]
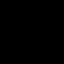
[frame 16/64]
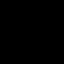
[frame 27/64]
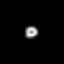
[frame 38/64]
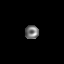
[frame 48/64]
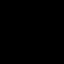
[frame 59/64]
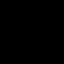

[Series 1: wbr_s-proj_st stress_(id)_sa · 6.5mm · 6.51mm/px · 6 of 512 frames shown (2 of 2)]
[frame 43/512]
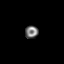
[frame 128/512]
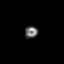
[frame 214/512]
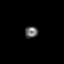
[frame 299/512]
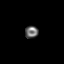
[frame 384/512]
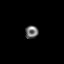
[frame 470/512]
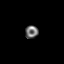

[18 of 18 positions shown; findings below may reference images not displayed]

Canned report from images found in remote index.

Refer to host system for actual result text.

## 2017-11-04 DIAGNOSIS — Z803 Family history of malignant neoplasm of breast: Secondary | ICD-10-CM | POA: Diagnosis not present

## 2017-11-04 DIAGNOSIS — Z1231 Encounter for screening mammogram for malignant neoplasm of breast: Secondary | ICD-10-CM | POA: Diagnosis not present

## 2017-11-04 LAB — HM MAMMOGRAPHY

## 2017-11-07 ENCOUNTER — Encounter: Payer: Self-pay | Admitting: Emergency Medicine

## 2017-11-17 MED FILL — LEVOTHYROXINE 75 MCG TABLET: 75 | 90 days supply | Qty: 90 | Fill #1

## 2017-12-28 DIAGNOSIS — H43812 Vitreous degeneration, left eye: Secondary | ICD-10-CM | POA: Diagnosis not present

## 2017-12-28 DIAGNOSIS — H5310 Unspecified subjective visual disturbances: Secondary | ICD-10-CM | POA: Diagnosis not present

## 2017-12-28 DIAGNOSIS — H2512 Age-related nuclear cataract, left eye: Secondary | ICD-10-CM | POA: Diagnosis not present

## 2017-12-28 LAB — HM DIABETES EYE EXAM

## 2018-01-04 ENCOUNTER — Other Ambulatory Visit: Payer: Self-pay | Admitting: Family Medicine

## 2018-01-04 ENCOUNTER — Ambulatory Visit (INDEPENDENT_AMBULATORY_CARE_PROVIDER_SITE_OTHER): Payer: Self-pay | Admitting: Nurse Practitioner

## 2018-01-04 VITALS — BP 130/72 | HR 80 | Temp 98.7°F | Wt 206.8 lb

## 2018-01-04 DIAGNOSIS — J029 Acute pharyngitis, unspecified: Secondary | ICD-10-CM

## 2018-01-04 LAB — POCT RAPID STREP A (OFFICE): Rapid Strep A Screen: NEGATIVE

## 2018-01-04 MED ORDER — MAGIC MOUTHWASH W/LIDOCAINE: 5.0000 mL | Freq: Three times a day (TID) | ORAL | 0 refills | Status: AC | PRN

## 2018-01-04 MED FILL — CMPD MMW LID:NYS:DPH:MAX: 5 days supply | Qty: 75 | Fill #0

## 2018-01-04 NOTE — Progress Notes (Signed)
Subjective:     Holly Riley is a 62 y.o. female who presents for evaluation of sore throat. Associated symptoms include sore throat and fatigue and nausea.  The patient rates current throat pain 3/10 at present.  Onset of symptoms was 3 days ago, and have been unchanged since that time. She is drinking plenty of fluids. 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  Patient states she has taken ibuprofen and Tylenol for pain with some relief.  The following portions of the patient's history were reviewed and updated as appropriate: allergies, current medications and past medical history.  Review of Systems Constitutional: positive for fatigue, negative for anorexia, chills, fevers and malaise Eyes: negative Ears, nose, mouth, throat, and face: positive for sore throat, negative for ear drainage, earaches and nasal congestion Respiratory: negative Cardiovascular: negative Gastrointestinal: positive for nausea, negative for abdominal pain, diarrhea and vomiting Neurological: negative    Objective:    BP 130/72 (BP Location: Right Arm, Patient Position: Sitting)   Pulse 80   Temp 98.7 F (37.1 C) (Oral)   Wt 206 lb 12.8 oz (93.8 kg)   SpO2 98%   BMI 33.89 kg/m  General appearance: alert, cooperative, fatigued and no distress Head: Normocephalic, without obvious abnormality, atraumatic Eyes: conjunctivae/corneas clear. PERRL, EOM's intact. Fundi benign. Ears: normal TM's and external ear canals both ears Nose: Nares normal. Septum midline. Mucosa normal. No drainage or sinus tenderness. Throat: abnormal findings: moderate oropharyngeal erythema and no exudates Lungs: clear to auscultation bilaterally Heart: regular rate and rhythm, S1, S2 normal, no murmur, click, rub or gallop Abdomen: soft, non-tender; bowel sounds normal; no masses,  no organomegaly Pulses: 2+ and symmetric Skin: Skin color, texture,  turgor normal. No rashes or lesions Lymph nodes: mild cervical adenopathy on left Neurologic: Grossly normal  Laboratory Strep test done. Results:negative.    Assessment:    Acute pharyngitis, likely  Viral pharyngitis.    Plan:   Exam findings, diagnosis etiology and medication use and indications reviewed with patient. Follow- Up and discharge instructions provided. No emergent/urgent issues found on exam. Patient education was provided. Patient verbalized understanding of information provided and agrees with plan of care (POC), all questions answered. The patient is advised to call or return to clinic if condition does not see an improvement in symptoms, or to seek the care of the closest emergency department if condition worsens with the above plan.   1. Viral pharyngitis  - magic mouthwash w/lidocaine SOLN; Take 5 mLs by mouth 3 (three) times daily as needed for up to 5 days for mouth pain. Gargle and swallow 22mLs as needed for throat pain  Dispense: 75 mL; Refill: 0 -Use Magic mouthwash as directed. -Ibuprofen 800 mg every 8 hours for the next 3 days. -Warm salt water gargles 3-4 times daily as needed for throat pain or discomfort. -May use a teaspoon of honey as needed for throat pain or discomfort. -Follow-up in 48 hours if symptoms do not improve.  May follow-up sooner if you develop fever, worsening throat pain, or other concerns.

## 2018-01-04 NOTE — Addendum Note (Signed)
Addended by: Lurena Nida on: 01/04/2018 08:42 AM   Modules accepted: Orders

## 2018-01-04 NOTE — Patient Instructions (Addendum)
Pharyngitis -Use Magic mouthwash as directed. -Ibuprofen 800 mg every 8 hours for the next 3 days. -Warm salt water gargles 3-4 times daily as needed for throat pain or discomfort. -May use a teaspoon of honey as needed for throat pain or discomfort. -Follow-up in 48 hours if symptoms do not improve.  May follow-up sooner if you develop fever, worsening throat pain, or other concerns.  Pharyngitis is redness, pain, and swelling (inflammation) of the throat (pharynx). It is a very common cause of sore throat. Pharyngitis can be caused by a bacteria, but it is usually caused by a virus. Most cases of pharyngitis get better on their own without treatment. What are the causes? This condition may be caused by:  Infection by viruses (viral). Viral pharyngitis spreads from person to person (is contagious) through coughing, sneezing, and sharing of personal items or utensils such as cups, forks, spoons, and toothbrushes.  Infection by bacteria (bacterial). Bacterial pharyngitis may be spread by touching the nose or face after coming in contact with the bacteria, or through more intimate contact, such as kissing.  Allergies. Allergies can cause buildup of mucus in the throat (post-nasal drip), leading to inflammation and irritation. Allergies can also cause blocked nasal passages, forcing breathing through the mouth, which dries and irritates the throat.  What increases the risk? You are more likely to develop this condition if:  You are 69-39 years old.  You are exposed to crowded environments such as daycare, school, or dormitory living.  You live in a cold climate.  You have a weakened disease-fighting (immune) system.  What are the signs or symptoms? Symptoms of this condition vary by the cause (viral, bacterial, or allergies) and can include:  Sore throat.  Fatigue.  Low-grade fever.  Headache.  Joint pain and muscle aches.  Skin rashes.  Swollen glands in the throat (lymph  nodes).  Plaque-like film on the throat or tonsils. This is often a symptom of bacterial pharyngitis.  Vomiting.  Stuffy nose (nasal congestion).  Cough.  Red, itchy eyes (conjunctivitis).  Loss of appetite.  How is this diagnosed? This condition is often diagnosed based on your medical history and a physical exam. Your health care provider will ask you questions about your illness and your symptoms. A swab of your throat may be done to check for bacteria (rapid strep test). Other lab tests may also be done, depending on the suspected cause, but these are rare. How is this treated? This condition usually gets better in 3-4 days without medicine. Bacterial pharyngitis may be treated with antibiotic medicines. Follow these instructions at home:  Take over-the-counter and prescription medicines only as told by your health care provider. ? If you were prescribed an antibiotic medicine, take it as told by your health care provider. Do not stop taking the antibiotic even if you start to feel better. ? Do not give children aspirin because of the association with Reye syndrome.  Drink enough water and fluids to keep your urine clear or pale yellow.  Get a lot of rest.  Gargle with a salt-water mixture 3-4 times a day or as needed. To make a salt-water mixture, completely dissolve -1 tsp of salt in 1 cup of warm water.  If your health care provider approves, you may use throat lozenges or sprays to soothe your throat. Contact a health care provider if:  You have large, tender lumps in your neck.  You have a rash.  You cough up green, yellow-brown, or bloody spit.  Get help right away if:  Your neck becomes stiff.  You drool or are unable to swallow liquids.  You cannot drink or take medicines without vomiting.  You have severe pain that does not go away, even after you take medicine.  You have trouble breathing, and it is not caused by a stuffy nose.  You have new pain and  swelling in your joints such as the knees, ankles, wrists, or elbows. Summary  Pharyngitis is redness, pain, and swelling (inflammation) of the throat (pharynx).  While pharyngitis can be caused by a bacteria, the most common causes are viral.  Most cases of pharyngitis get better on their own without treatment.  Bacterial pharyngitis is treated with antibiotic medicines. This information is not intended to replace advice given to you by your health care provider. Make sure you discuss any questions you have with your health care provider. Document Released: 02/15/2005 Document Revised: 03/23/2016 Document Reviewed: 03/23/2016 Elsevier Interactive Patient Education  [employer].

## 2018-01-05 ENCOUNTER — Other Ambulatory Visit: Payer: Self-pay | Admitting: Emergency Medicine

## 2018-01-05 MED ORDER — ESTRADIOL 0.5 MG PO TABS: 0.5000 mg | ORAL_TABLET | Freq: Every day | ORAL | 3 refills | Status: DC

## 2018-01-05 MED ORDER — ROSUVASTATIN CALCIUM 5 MG PO TABS: 5.0000 mg | ORAL_TABLET | ORAL | 3 refills | Status: DC

## 2018-01-05 MED FILL — ESTRADIOL 0.5 MG TABLET: 0.5 | 90 days supply | Qty: 90 | Fill #0

## 2018-01-06 ENCOUNTER — Other Ambulatory Visit: Payer: Self-pay | Admitting: Nurse Practitioner

## 2018-01-06 MED ORDER — AMOXICILLIN 875 MG PO TABS: 875.0000 mg | ORAL_TABLET | Freq: Two times a day (BID) | ORAL | 0 refills | Status: AC

## 2018-01-06 MED FILL — AMOXICILLIN 875 MG TABS: 875 | 10 days supply | Qty: 20 | Fill #0

## 2018-01-06 NOTE — Progress Notes (Signed)
Patient call the office to inform that her symptoms are not improving.  The patient continues to complain of sore throat.  Will call in a prescription for amoxicillin 875 mg twice daily for 10 days.

## 2018-02-09 ENCOUNTER — Other Ambulatory Visit: Payer: Self-pay

## 2018-02-09 ENCOUNTER — Ambulatory Visit: Payer: 59 | Admitting: Family Medicine

## 2018-02-09 ENCOUNTER — Encounter: Payer: Self-pay | Admitting: Family Medicine

## 2018-02-09 VITALS — BP 120/78 | HR 88 | Temp 97.9°F | Resp 14 | Ht 65.5 in | Wt 202.0 lb

## 2018-02-09 DIAGNOSIS — E119 Type 2 diabetes mellitus without complications: Secondary | ICD-10-CM | POA: Diagnosis not present

## 2018-02-09 DIAGNOSIS — E039 Hypothyroidism, unspecified: Secondary | ICD-10-CM | POA: Diagnosis not present

## 2018-02-09 DIAGNOSIS — E1169 Type 2 diabetes mellitus with other specified complication: Secondary | ICD-10-CM

## 2018-02-09 DIAGNOSIS — E782 Mixed hyperlipidemia: Secondary | ICD-10-CM | POA: Diagnosis not present

## 2018-02-09 DIAGNOSIS — E559 Vitamin D deficiency, unspecified: Secondary | ICD-10-CM

## 2018-02-09 DIAGNOSIS — G4726 Circadian rhythm sleep disorder, shift work type: Secondary | ICD-10-CM

## 2018-02-09 LAB — COMPREHENSIVE METABOLIC PANEL
ALK PHOS: 78 U/L (ref 39–117)
ALT: 15 U/L (ref 0–35)
AST: 15 U/L (ref 0–37)
Albumin: 3.7 g/dL (ref 3.5–5.2)
BILIRUBIN TOTAL: 0.4 mg/dL (ref 0.2–1.2)
BUN: 11 mg/dL (ref 6–23)
CALCIUM: 9.3 mg/dL (ref 8.4–10.5)
CHLORIDE: 102 meq/L (ref 96–112)
CO2: 28 mEq/L (ref 19–32)
Creatinine, Ser: 0.91 mg/dL (ref 0.40–1.20)
GFR: 66.07 mL/min (ref >60.00)
Glucose, Bld: 139 mg/dL — ABNORMAL HIGH (ref 70–99)
POTASSIUM: 4 meq/L (ref 3.5–5.1)
Sodium: 138 mEq/L (ref 135–145)
TOTAL PROTEIN: 7 g/dL (ref 6.0–8.3)

## 2018-02-09 LAB — CBC WITH DIFFERENTIAL/PLATELET
BASOS PCT: 0.6 % (ref 0.0–3.0)
Basophils Absolute: 0 10*3/uL (ref 0.0–0.1)
EOS ABS: 0.2 10*3/uL (ref 0.0–0.7)
Eosinophils Relative: 2.2 % (ref 0.0–5.0)
HCT: 44.5 % (ref 36.0–46.0)
Hemoglobin: 15.3 g/dL — ABNORMAL HIGH (ref 12.0–15.0)
Lymphocytes Relative: 30.1 % (ref 12.0–46.0)
Lymphs Abs: 2.3 10*3/uL (ref 0.7–4.0)
MCHC: 34.4 g/dL (ref 30.0–36.0)
MCV: 96 fl (ref 78.0–100.0)
Monocytes Absolute: 0.6 10*3/uL (ref 0.1–1.0)
Monocytes Relative: 8.2 % (ref 3.0–12.0)
Neutro Abs: 4.5 10*3/uL (ref 1.4–7.7)
Neutrophils Relative %: 58.9 % (ref 43.0–77.0)
Platelets: 297 10*3/uL (ref 150.0–400.0)
RBC: 4.64 Mil/uL (ref 3.87–5.11)
RDW: 12.9 % (ref 11.5–15.5)
WBC: 7.6 10*3/uL (ref 4.0–10.5)

## 2018-02-09 LAB — POCT GLYCOSYLATED HEMOGLOBIN (HGB A1C): HBA1C, POC (PREDIABETIC RANGE): 5.8 % (ref 5.7–6.4)

## 2018-02-09 LAB — LIPID PANEL
Cholesterol: 237 mg/dL — ABNORMAL HIGH (ref 0–200)
HDL: 69.2 mg/dL (ref >39.00)
LDL Cholesterol: 146 mg/dL — ABNORMAL HIGH (ref 0–99)
NonHDL: 167.61
TRIGLYCERIDES: 107 mg/dL (ref 0.0–149.0)
Total CHOL/HDL Ratio: 3
VLDL: 21.4 mg/dL (ref 0.0–40.0)

## 2018-02-09 LAB — MICROALBUMIN / CREATININE URINE RATIO
CREATININE, U: 218.9 mg/dL
MICROALB UR: 0.7 mg/dL (ref 0.0–1.9)
Microalb Creat Ratio: 0.3 mg/g (ref 0.0–30.0)

## 2018-02-09 LAB — VITAMIN D 25 HYDROXY (VIT D DEFICIENCY, FRACTURES): VITD: 34.35 ng/mL (ref 30.00–100.00)

## 2018-02-09 LAB — TSH: TSH: 2.27 u[IU]/mL (ref 0.35–4.50)

## 2018-02-09 NOTE — Patient Instructions (Signed)
Please return in 6 months for your annual complete physical; please come fasting.  I will release your lab results to you on your MyChart account with further instructions. Please reply with any questions.   Happy Holidays. And good luck with your upcoming retirement.  If you have any questions or concerns, please don't hesitate to send me a message via MyChart or call the office at [phone]. Thank you for visiting with [place] today! It's our pleasure caring for you.

## 2018-02-09 NOTE — Progress Notes (Signed)
Subjective  CC:  Chief Complaint  Patient presents with  . Hyperlipidemia  . Diabetes    HPI: Holly Riley is a 62 y.o. female who presents to the office today for follow up of diabetes and problems listed above in the chief complaint.   Diabetes follow up: Her diabetic control is reported as Unchanged. She skips meals due to work schedule and demand.  She denies exertional CP or SOB or symptomatic hypoglycemia. She denies foot sores or paresthesias.   HLD on intermittent statin. Tolerating. Starting to have leg cramps again; last time resolved with correction of low vit D. Takes 2000 units daily/ she is fasting fasting for blood work today.  Hypothyroid on replacement therapy and doing well.  No energy changes, unwanted weight changes.  Sleep is difficult due to shift work.  She is excited because she plans to retire in April of next year.  Assessment  1. Acquired hypothyroidism   2. Controlled type 2 diabetes mellitus without complication, without long-term current use of insulin ([place])   3. Combined hyperlipidemia associated with type 2 diabetes mellitus ([place])   4. Vitamin D deficiency   5. Shift work sleep disorder      Plan   Diabetes is currently very well controlled.  Diet controlled.  Due for urine microalbuminuria today.  She does not have hypertension and she is not on an ACE inhibitor.  This is reasonable.  Check renal function and electrolytes.  Normal foot exam today.  Eye exam up-to-date.  Of note she was recently diagnosed with a vitreous hemorrhage: We have requested records for review  Hyperlipidemia on intermittent use statin: Recheck levels today.  History of vitamin D deficiency with lower extremity cramping: Recheck today.  Supplement if needed  Hypothyroidism, recheck today.  Clinically euthyroid  On intermittent Ambien due to shiftwork sleep disorder.  No refills needed at this time  Health maintenance: due shingrix. Can give next visit.   Follow up:  Return in about 6 months (around 08/11/2018) for complete physical.. Orders Placed This Encounter  Procedures  . Lipid panel  . Comprehensive metabolic panel  . CBC with Differential/Platelet  . TSH  . Microalbumin / creatinine urine ratio  . VITAMIN D 25 Hydroxy (Vit-D Deficiency, Fractures)  . POCT glycosylated hemoglobin (Hb A1C)   No orders of the defined types were placed in this encounter.     Immunization History  Administered Date(s) Administered  . DTaP 04/27/2006  . Hepatitis B, ped/adol 04/28/1991  . Influenza Split 12/26/2007  . Influenza, Quadrivalent, Recombinant, Inj, Pf 01/22/2013  . Influenza, Seasonal, Injecte, Preservative Fre 12/13/2013, 11/20/2014  . Influenza,inj,Quad PF,6+ Mos 12/17/2015  . Influenza-Unspecified 12/14/2016  . Pneumococcal Polysaccharide-23 08/27/2014  . Tdap 10/19/2014    Diabetes Related Lab Review: Lab Results  Component Value Date   HGBA1C 5.8 02/09/2018   HGBA1C 6.0 (A) 08/11/2017   HGBA1C 6.4 05/17/2017    Lab Results  Component Value Date   MICROALBUR <0.7 02/17/2017   Lab Results  Component Value Date   CREATININE 0.83 02/17/2017   BUN 12 02/17/2017   NA 140 02/17/2017   K 3.9 02/17/2017   CL 105 02/17/2017   CO2 29 02/17/2017   Lab Results  Component Value Date   CHOL 184 02/17/2017   Lab Results  Component Value Date   HDL 68.60 02/17/2017   Lab Results  Component Value Date   LDLCALC 98 02/17/2017   Lab Results  Component Value Date   TRIG  88.0 02/17/2017   Lab Results  Component Value Date   CHOLHDL 3 02/17/2017   No results found for: LDLDIRECT The 10-year ASCVD risk score Mikey Bussing DC Jr., et al., 2013) is: 7.2%   Values used to calculate the score:     Age: 47 years     Sex: Female     Is Non-Hispanic African American: No     Diabetic: Yes     Tobacco smoker: No     Systolic Blood Pressure: 166 mmHg     Is BP treated: No     HDL Cholesterol: 68.6 mg/dL     Total Cholesterol: 184 mg/dL I  have reviewed the PMH, Fam and Soc history. Patient Active Problem List   Diagnosis Date Noted  . Controlled type 2 diabetes mellitus without complication, without long-term current use of insulin ([place]) 02/17/2017    Priority: High  . Post-menopause on HRT (hormone replacement therapy) 09/11/2013    Priority: High  . Hypothyroidism 03/26/2013    Priority: High  . Obesity (BMI 30.0-34.9) 01/22/2013    Priority: High  . 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 Insomnia 09/11/2013    Priority: Medium  . Osteoarthritis 04/28/2011    Priority: Medium  . Allergic rhinitis 11/25/2009    Priority: Low  . Statin intolerance 03/20/2017    Myalgias, simvistatin and crestor   . Vitamin D deficiency 03/20/2017  . Combined hyperlipidemia associated with type 2 diabetes mellitus ([place]) 02/17/2017  . Spinal stenosis     NS - early, MRI     Social History: Patient  reports that she has never smoked. She has never used smokeless tobacco. She reports that she does not drink alcohol or use drugs.  Review of Systems: Ophthalmic: negative for eye pain, loss of vision or double vision Cardiovascular: negative for chest pain Respiratory: negative for SOB or persistent cough Gastrointestinal: negative for abdominal pain Genitourinary: negative for dysuria or gross hematuria MSK: negative for foot lesions Neurologic: negative for weakness or gait disturbance  Objective  Vitals: BP 120/78   Pulse 88   Temp 97.9 F (36.6 C) (Oral)   Resp 14   Ht 5' 5.5" (1.664 m)   Wt 202 lb (91.6 kg)   SpO2 96%   BMI 33.10 kg/m  General: well appearing, no acute distress  Psych:  Alert and oriented, normal mood and affect HEENT:  Normocephalic, atraumatic, moist mucous membranes, supple neck  Cardiovascular:  Nl S1 and S2, RRR without murmur, gallop or rub. no edema Respiratory:  Good breath sounds bilaterally, CTAB with normal effort, no rales Gastrointestinal: normal BS, soft,  nontender Skin:  Warm, no rashes Neurologic:   Mental status is normal. normal gait, no tremor Foot exam: no erythema, pallor, or cyanosis visible nl proprioception and sensation to monofilament testing bilaterally, +2 distal pulses bilaterally   Diabetic education: ongoing education regarding chronic disease management for diabetes was given today. We continue to reinforce the ABC's of diabetic management: A1c (<7 or 8 dependent upon patient), tight blood pressure control, and cholesterol management with goal LDL < 100 minimally. We discuss diet strategies, exercise recommendations, medication options and possible side effects. At each visit, we review recommended immunizations and preventive care recommendations for diabetics and stress that good diabetic control can prevent other problems. See below for this patient's data.    Commons side effects, risks, benefits, and alternatives for medications and treatment plan prescribed today were discussed, and the patient expressed understanding  of the given instructions. Patient is instructed to call or message via MyChart if he/she has any questions or concerns regarding our treatment plan. No barriers to understanding were identified. We discussed Red Flag symptoms and signs in detail. Patient expressed understanding regarding what to do in case of urgent or emergency type symptoms.   Medication list was reconciled, printed and provided to the patient in AVS. Patient instructions and summary information was reviewed with the patient as documented in the AVS. This note was prepared with assistance of Dragon voice recognition software. Occasional wrong-word or sound-a-like substitutions may have occurred due to the inherent limitations of voice recognition software

## 2018-02-21 MED FILL — LEVOTHYROXINE 75 MCG TABLET: 75 | 90 days supply | Qty: 90 | Fill #0

## 2018-02-24 ENCOUNTER — Other Ambulatory Visit: Payer: Self-pay | Admitting: *Deleted

## 2018-02-24 MED ORDER — ROSUVASTATIN CALCIUM 5 MG PO TABS: 5.0000 mg | ORAL_TABLET | ORAL | 0 refills | Status: DC

## 2018-02-24 MED FILL — ROSUVASTATIN CALCIUM 5 MG T: 5 | 84 days supply | Qty: 24 | Fill #0

## 2018-04-10 MED FILL — ESTRADIOL 0.5 MG TABS: 0.5 | 90 days supply | Qty: 90 | Fill #1

## 2018-05-21 MED FILL — LEVOTHYROXINE 75 MCG TABLET: 75 | 90 days supply | Qty: 90 | Fill #1

## 2018-05-23 MED FILL — ROSUVASTATIN CALCIUM 5 MG T: 5 | 84 days supply | Qty: 24 | Fill #1

## 2018-06-27 DIAGNOSIS — D2271 Melanocytic nevi of right lower limb, including hip: Secondary | ICD-10-CM | POA: Diagnosis not present

## 2018-06-27 DIAGNOSIS — L821 Other seborrheic keratosis: Secondary | ICD-10-CM | POA: Diagnosis not present

## 2018-06-27 DIAGNOSIS — D2372 Other benign neoplasm of skin of left lower limb, including hip: Secondary | ICD-10-CM | POA: Diagnosis not present

## 2018-06-27 DIAGNOSIS — D2371 Other benign neoplasm of skin of right lower limb, including hip: Secondary | ICD-10-CM | POA: Diagnosis not present

## 2018-06-27 DIAGNOSIS — D225 Melanocytic nevi of trunk: Secondary | ICD-10-CM | POA: Diagnosis not present

## 2018-06-27 DIAGNOSIS — Z85828 Personal history of other malignant neoplasm of skin: Secondary | ICD-10-CM | POA: Diagnosis not present

## 2018-07-13 MED FILL — ESTRADIOL 0.5 MG TABS: 0.5 | 90 days supply | Qty: 90 | Fill #2

## 2018-08-21 ENCOUNTER — Other Ambulatory Visit: Payer: Self-pay | Admitting: Family Medicine

## 2018-08-21 MED ORDER — LEVOTHYROXINE SODIUM 75 MCG PO TABS: 75.0000 ug | ORAL_TABLET | Freq: Every day | ORAL | 0 refills | Status: DC

## 2018-08-21 NOTE — Telephone Encounter (Signed)
Pt aware RX sent and keep 6/30 appointment

## 2018-08-21 NOTE — Telephone Encounter (Signed)
REFILL : levothyroxine (SYNTHROID, LEVOTHROID) 75 MCG tablet   NEW PHARMACY : [facility]  [address], [place]  [address]  [phone]

## 2018-08-21 NOTE — Telephone Encounter (Signed)
See note

## 2018-08-29 ENCOUNTER — Ambulatory Visit: Payer: 59 | Admitting: Family Medicine

## 2018-10-10 ENCOUNTER — Other Ambulatory Visit: Payer: Self-pay

## 2018-10-10 ENCOUNTER — Encounter: Payer: Self-pay | Admitting: Family Medicine

## 2018-10-10 ENCOUNTER — Ambulatory Visit (INDEPENDENT_AMBULATORY_CARE_PROVIDER_SITE_OTHER): Payer: Medicare HMO | Admitting: Family Medicine

## 2018-10-10 ENCOUNTER — Ambulatory Visit (INDEPENDENT_AMBULATORY_CARE_PROVIDER_SITE_OTHER): Payer: Medicare HMO

## 2018-10-10 VITALS — BP 142/78 | HR 82 | Temp 98.0°F | Resp 16 | Ht 65.5 in | Wt 207.8 lb

## 2018-10-10 DIAGNOSIS — Z79890 Hormone replacement therapy (postmenopausal): Secondary | ICD-10-CM | POA: Diagnosis not present

## 2018-10-10 DIAGNOSIS — E1169 Type 2 diabetes mellitus with other specified complication: Secondary | ICD-10-CM | POA: Diagnosis not present

## 2018-10-10 DIAGNOSIS — E039 Hypothyroidism, unspecified: Secondary | ICD-10-CM

## 2018-10-10 DIAGNOSIS — G4726 Circadian rhythm sleep disorder, shift work type: Secondary | ICD-10-CM | POA: Diagnosis not present

## 2018-10-10 DIAGNOSIS — E782 Mixed hyperlipidemia: Secondary | ICD-10-CM | POA: Diagnosis not present

## 2018-10-10 DIAGNOSIS — M79671 Pain in right foot: Secondary | ICD-10-CM

## 2018-10-10 DIAGNOSIS — E2839 Other primary ovarian failure: Secondary | ICD-10-CM

## 2018-10-10 DIAGNOSIS — E119 Type 2 diabetes mellitus without complications: Secondary | ICD-10-CM

## 2018-10-10 DIAGNOSIS — Z0000 Encounter for general adult medical examination without abnormal findings: Secondary | ICD-10-CM | POA: Diagnosis not present

## 2018-10-10 LAB — CBC WITH DIFFERENTIAL/PLATELET
Basophils Absolute: 0 10*3/uL (ref 0.0–0.1)
Basophils Relative: 0.5 % (ref 0.0–3.0)
Eosinophils Absolute: 0.2 10*3/uL (ref 0.0–0.7)
Eosinophils Relative: 2.8 % (ref 0.0–5.0)
HCT: 42.1 % (ref 36.0–46.0)
Hemoglobin: 14.2 g/dL (ref 12.0–15.0)
Lymphocytes Relative: 29.4 % (ref 12.0–46.0)
Lymphs Abs: 1.9 10*3/uL (ref 0.7–4.0)
MCHC: 33.8 g/dL (ref 30.0–36.0)
MCV: 97.3 fl (ref 78.0–100.0)
Monocytes Absolute: 0.6 10*3/uL (ref 0.1–1.0)
Monocytes Relative: 8.9 % (ref 3.0–12.0)
Neutro Abs: 3.7 10*3/uL (ref 1.4–7.7)
Neutrophils Relative %: 58.4 % (ref 43.0–77.0)
Platelets: 260 10*3/uL (ref 150.0–400.0)
RBC: 4.32 Mil/uL (ref 3.87–5.11)
RDW: 12.9 % (ref 11.5–15.5)
WBC: 6.4 10*3/uL (ref 4.0–10.5)

## 2018-10-10 LAB — COMPREHENSIVE METABOLIC PANEL
ALT: 18 U/L (ref 0–35)
AST: 16 U/L (ref 0–37)
Albumin: 3.7 g/dL (ref 3.5–5.2)
Alkaline Phosphatase: 86 U/L (ref 39–117)
BUN: 11 mg/dL (ref 6–23)
CO2: 26 mEq/L (ref 19–32)
Calcium: 9.2 mg/dL (ref 8.4–10.5)
Chloride: 104 mEq/L (ref 96–112)
Creatinine, Ser: 0.85 mg/dL (ref 0.40–1.20)
GFR: 67.11 mL/min (ref >60.00)
Glucose, Bld: 122 mg/dL — ABNORMAL HIGH (ref 70–99)
Potassium: 3.9 mEq/L (ref 3.5–5.1)
Sodium: 139 mEq/L (ref 135–145)
Total Bilirubin: 0.4 mg/dL (ref 0.2–1.2)
Total Protein: 6.3 g/dL (ref 6.0–8.3)

## 2018-10-10 LAB — LIPID PANEL
Cholesterol: 228 mg/dL — ABNORMAL HIGH (ref 0–200)
HDL: 73.1 mg/dL (ref >39.00)
LDL Cholesterol: 137 mg/dL — ABNORMAL HIGH (ref 0–99)
NonHDL: 155.11
Total CHOL/HDL Ratio: 3
Triglycerides: 91 mg/dL (ref 0.0–149.0)
VLDL: 18.2 mg/dL (ref 0.0–40.0)

## 2018-10-10 LAB — POCT GLYCOSYLATED HEMOGLOBIN (HGB A1C): Hemoglobin A1C: 6.5 % — AB (ref 4.0–5.6)

## 2018-10-10 LAB — TSH: TSH: 1.03 u[IU]/mL (ref 0.35–4.50)

## 2018-10-10 LAB — MICROALBUMIN / CREATININE URINE RATIO
Creatinine,U: 61 mg/dL
Microalb Creat Ratio: 1.1 mg/g (ref 0.0–30.0)
Microalb, Ur: <0.7 mg/dL (ref 0.0–1.9)

## 2018-10-10 MED ORDER — PREVNAR 13 IM SUSP: 0.5000 mL | Freq: Once | INTRAMUSCULAR | 0 refills | Status: AC

## 2018-10-10 NOTE — Patient Instructions (Addendum)
Please return in 3 months recheck diabetes. Work on eating a diabetic diet and exercise: daily walking is a good choice.   I will release your lab results to you on your MyChart account with further instructions. Please reply with any questions.   Please get your bone density done with your mammogram this year. I have placed a referral.   Take the RX for prevnar to a pharmacy for your vaccination.   If you have any questions or concerns, please don't hesitate to send me a message via MyChart or call the office at [phone]. Thank you for visiting with [place] today! It's our pleasure caring for you.   Preventive Care 65 Years and Older, Female Preventive care refers to lifestyle choices and visits with your health care provider that can promote health and wellness. This includes:  A yearly physical exam. This is also called an annual well check.  Regular dental and eye exams.  Immunizations.  Screening for certain conditions.  Healthy lifestyle choices, such as diet and exercise. What can I expect for my preventive care visit? Physical exam Your health care provider will check:  Height and weight. These may be used to calculate body mass index (BMI), which is a measurement that tells if you are at a healthy weight.  Heart rate and blood pressure.  Your skin for abnormal spots. Counseling Your health care provider may ask you questions about:  Alcohol, tobacco, and drug use.  Emotional well-being.  Home and relationship well-being.  Sexual activity.  Eating habits.  History of falls.  Memory and ability to understand (cognition).  Work and work Statistician.  Pregnancy and menstrual history. What immunizations do I need?  Influenza (flu) vaccine  This is recommended every year. Tetanus, diphtheria, and pertussis (Tdap) vaccine  You may need a Td booster every 10 years. Varicella (chickenpox) vaccine  You may need this vaccine if you have not already been  vaccinated. Zoster (shingles) vaccine  You may need this after age 20. Pneumococcal conjugate (PCV13) vaccine  One dose is recommended after age 65. Pneumococcal polysaccharide (PPSV23) vaccine  One dose is recommended after age 65. Measles, mumps, and rubella (MMR) vaccine  You may need at least one dose of MMR if you were born in 1957 or later. You may also need a second dose. Meningococcal conjugate (MenACWY) vaccine  You may need this if you have certain conditions. Hepatitis A vaccine  You may need this if you have certain conditions or if you travel or work in places where you may be exposed to hepatitis A. Hepatitis B vaccine  You may need this if you have certain conditions or if you travel or work in places where you may be exposed to hepatitis B. Haemophilus influenzae type b (Hib) vaccine  You may need this if you have certain conditions. You may receive vaccines as individual doses or as more than one vaccine together in one shot (combination vaccines). Talk with your health care provider about the risks and benefits of combination vaccines. What tests do I need? Blood tests  Lipid and cholesterol levels. These may be checked every 5 years, or more frequently depending on your overall health.  Hepatitis C test.  Hepatitis B test. Screening  Lung cancer screening. You may have this screening every year starting at age 71 if you have a 30-pack-year history of smoking and currently smoke or have quit within the past 15 years.  Colorectal cancer screening. All adults should have this screening starting  at age 27 and continuing until age 65. Your health care provider may recommend screening at age 36 if you are at increased risk. You will have tests every 1-10 years, depending on your results and the type of screening test.  Diabetes screening. This is done by checking your blood sugar (glucose) after you have not eaten for a while (fasting). You may have this done  every 1-3 years.  Mammogram. This may be done every 1-2 years. Talk with your health care provider about how often you should have regular mammograms.  BRCA-related cancer screening. This may be done if you have a family history of breast, ovarian, tubal, or peritoneal cancers. Other tests  Sexually transmitted disease (STD) testing.  Bone density scan. This is done to screen for osteoporosis. You may have this done starting at age 65. Follow these instructions at home: Eating and drinking  Eat a diet that includes fresh fruits and vegetables, whole grains, lean protein, and low-fat dairy products. Limit your intake of foods with high amounts of sugar, saturated fats, and salt.  Take vitamin and mineral supplements as recommended by your health care provider.  Do not drink alcohol if your health care provider tells you not to drink.  If you drink alcohol: ? Limit how much you have to 0-1 drink a day. ? Be aware of how much alcohol is in your drink. In the [place] one drink equals one 12 oz bottle of beer (355 mL), one 5 oz glass of wine (148 mL), or one 1 oz glass of hard liquor (44 mL). Lifestyle  Take daily care of your teeth and gums.  Stay active. Exercise for at least 30 minutes on 5 or more days each week.  Do not use any products that contain nicotine or tobacco, such as cigarettes, e-cigarettes, and chewing tobacco. If you need help quitting, ask your health care provider.  If you are sexually active, practice safe sex. Use a condom or other form of protection in order to prevent STIs (sexually transmitted infections).  Talk with your health care provider about taking a low-dose aspirin or statin. What's next?  Go to your health care provider once a year for a well check visit.  Ask your health care provider how often you should have your eyes and teeth checked.  Stay up to date on all vaccines. This information is not intended to replace advice given to you by your  health care provider. Make sure you discuss any questions you have with your health care provider. Document Released: 03/14/2015 Document Revised: 02/09/2018 Document Reviewed: 02/09/2018 Elsevier Patient Education  2020 [employer].

## 2018-10-10 NOTE — Progress Notes (Signed)
Subjective  Chief Complaint  Patient presents with  . Annual Exam    She is fasting  . Hypothyroidism  . Diabetes  . Right foot    Reports pain with walking that shoots through foot, she reports that she is not taking anything for pain    HPI: Holly Riley is a 63 y.o. female who presents to [facility] [place] today for a Female Wellness Visit. She also has the concerns and/or needs as listed above in the chief complaint. These will be addressed in addition to the Health Maintenance Visit.   Wellness Visit: annual visit with health maintenance review and exam without Pap   HM: due mammo and dexa; prevnar offered. Has gained weight; retired. Happy. Chronic disease f/u and/or acute problem visit: (deemed necessary to be done in addition to the wellness visit):  H/o DM; had resolved last year with diet changes. Feels fine. Denies sxs of hyperglycemia. Eye exam due soon.   HLD on intermittent statin and tolerating well.   HRT is stable.   Sleep - improved since no longer working 12 hour shifts.   C/o 4 months of right midfoot pain; started gradually and was mild but now daily with walking. No injury. ? Swelling. Same pain no matter what type of footwear she wears. Has high arches and supports them well. No ankle pain.   Assessment  1. Annual physical exam   2. Controlled type 2 diabetes mellitus without complication, without long-term current use of insulin ([place])   3. Acquired hypothyroidism   4. Post-menopause on HRT (hormone replacement therapy)   5. Shift work sleep disorder   6. Combined hyperlipidemia associated with type 2 diabetes mellitus ([place])   7. Hypoestrogenism   8. Pain of midfoot, right      Plan  Female Wellness Visit:  Age appropriate Health Maintenance and Prevention measures were discussed with patient. Included topics are cancer screening recommendations, ways to keep healthy (see AVS) including dietary and exercise recommendations,  regular eye and dental care, use of seat belts, and avoidance of moderate alcohol use and tobacco use. Ordered mammo and dexa at [facility].   BMI: discussed patient's BMI and encouraged positive lifestyle modifications to help get to or maintain a target BMI.  HM needs and immunizations were addressed and ordered. See below for orders. See HM and immunization section for updates. Prevnar RX given.   Routine labs and screening tests ordered including cmp, cbc and lipids where appropriate.  Discussed recommendations regarding Vit D and calcium supplementation (see AVS)  Chronic disease management visit and/or acute problem visit:  Low thyroid: recheck today. Compliant with meds. Euthyroid clinically  DM: worsening. Discussed diet and increase in activiety and weight loss. Recheck 3 months. Start metformin at that time if needed.   HLD on statin intermittent dosing. Conitnue.   Foot pain: check xray? OA vs stress fracture vs other. Trial of nsaids. Sports med eval if not improving.   Insomnia is improved.  Follow up: Return in about 3 months (around 01/10/2019) for follow up Diabetes.  Orders Placed This Encounter  Procedures  . DG Bone Density  . DG Foot Complete Right  . CBC with Differential/Platelet  . Comprehensive metabolic panel  . Lipid panel  . TSH  . Microalbumin / creatinine urine ratio  . POCT glycosylated hemoglobin (Hb A1C)   Meds ordered this encounter  Medications  . pneumococcal 13-valent conjugate vaccine (PREVNAR 13) SUSP injection    Sig: Inject  0.5 mLs into the muscle once for 1 dose.    Dispense:  0.5 mL    Refill:  0      Lifestyle: Body mass index is 34.05 kg/m. Wt Readings from Last 3 Encounters:  10/10/18 207 lb 12.8 oz (94.3 kg)  02/09/18 202 lb (91.6 kg)  01/04/18 206 lb 12.8 oz (93.8 kg)     Patient Active Problem List   Diagnosis Date Noted  . Controlled type 2 diabetes mellitus without complication, without long-term current use of insulin  ([place]) 02/17/2017    Priority: High  . Combined hyperlipidemia associated with type 2 diabetes mellitus ([place]) 02/17/2017    Priority: High  . Spinal stenosis     Priority: High    NS - early, MRI   . Post-menopause on HRT (hormone replacement therapy) 09/11/2013    Priority: High  . Hypothyroidism 03/26/2013    Priority: High  . Obesity (BMI 30.0-34.9) 01/22/2013    Priority: High  . 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 Insomnia 09/11/2013    Priority: Medium  . Osteoarthritis 04/28/2011    Priority: Medium  . Vitamin D deficiency 03/20/2017    Priority: Low  . Allergic rhinitis 11/25/2009    Priority: Low  . Statin intolerance 03/20/2017    Myalgias, simvistatin and crestor    Health Maintenance  Topic Date Due  . DEXA SCAN  06/07/2014  . HEMOGLOBIN A1C  08/11/2018  . PNA vac Low Risk Adult (1 of 2 - PCV13) 09/15/2018  . INFLUENZA VACCINE  09/30/2018  . MAMMOGRAM  11/05/2018  . OPHTHALMOLOGY EXAM  12/29/2018  . URINE MICROALBUMIN  02/10/2019  . FOOT EXAM  10/10/2019  . COLONOSCOPY  06/21/2020  . TETANUS/TDAP  10/18/2024  . Hepatitis C Screening  Completed  . HIV Screening  Completed   Immunization History  Administered Date(s) Administered  . DTaP 04/27/2006  . Hepatitis B, ped/adol 04/28/1991  . Influenza Split 12/26/2007  . Influenza, Quadrivalent, Recombinant, Inj, Pf 01/22/2013  . Influenza, Seasonal, Injecte, Preservative Fre 12/13/2013, 11/20/2014  . Influenza,inj,Quad PF,6+ Mos 12/17/2015  . Influenza-Unspecified 12/14/2016  . Pneumococcal Polysaccharide-23 08/27/2014  . Tdap 10/19/2014   We updated and reviewed the patient's past history in detail and it is documented below. Allergies: Patient is allergic to morphine and related; statins; percocet [oxycodone-acetaminophen]; and vicodin [hydrocodone-acetaminophen]. Past Medical History Patient  has a past medical history of Arthritis, Hyperlipidemia, Spinal stenosis, Thyroid  disease, and Type 2 diabetes mellitus with hyperlipidemia ([place]). Past Surgical History Patient  has a past surgical history that includes Abdominal hysterectomy; Carpal tunnel release (Right); Knee surgery (Left); Tonsillectomy and adenoidectomy; Spine surgery; Cervical fusion; and Lumbar laminectomy. Family History: Patient family history includes Cancer in her father, mother, and sister; Diabetes in her brother, father, and sister; Heart disease in her father; Hyperlipidemia in her father, mother, sister, and sister. Social History:  Patient  reports that she has never smoked. She has never used smokeless tobacco. She reports that she does not drink alcohol or use drugs.  Review of Systems: Constitutional: negative for fever or malaise Ophthalmic: negative for photophobia, double vision or loss of vision Cardiovascular: negative for chest pain, dyspnea on exertion, or new LE swelling Respiratory: negative for SOB or persistent cough Gastrointestinal: negative for abdominal pain, change in bowel habits or melena Genitourinary: negative for dysuria or gross hematuria, no abnormal uterine bleeding or disharge Musculoskeletal: negative for new gait disturbance or muscular weakness Integumentary: negative for new  or persistent rashes, no breast lumps Neurological: negative for TIA or stroke symptoms Psychiatric: negative for SI or delusions Allergic/Immunologic: negative for hives  Patient Care Team    Relationship Specialty Notifications Start End  Leamon Arnt, MD PCP - General Family Medicine  12/20/14   Ashok Pall, MD Consulting Physician Neurosurgery  02/17/17   Marygrace Drought, MD Consulting Physician Ophthalmology  05/17/17     Objective  Vitals: BP (!) 142/78   Pulse 82   Temp 98 F (36.7 C) (Tympanic)   Resp 16   Ht 5' 5.5" (1.664 m)   Wt 207 lb 12.8 oz (94.3 kg)   SpO2 98%   BMI 34.05 kg/m  General:  Well developed, well nourished, no acute distress  Psych:  Alert  and orientedx3,normal mood and affect HEENT:  Normocephalic, atraumatic, non-icteric sclera, PERRL, oropharynx is clear without mass or exudate, supple neck without adenopathy, mass or thyromegaly Cardiovascular:  Normal S1, S2, RRR without gallop, rub or murmur, nondisplaced PMI Respiratory:  Good breath sounds bilaterally, CTAB with normal respiratory effort Gastrointestinal: normal bowel sounds, soft, non-tender, no noted masses. No HSM MSK: no deformities, contusions. Joints are without erythema or swelling. Spine and CVA region are nontender Skin:  Warm, no rashes or suspicious lesions noted Neurologic:    Mental status is normal. CN 2-11 are normal. Gross motor and sensory exams are normal. Normal gait. No tremor Breast Exam: No mass, skin retraction or nipple discharge is appreciated in either breast. No axillary adenopathy. Fibrocystic changes are not noted Diabetic Foot Exam: Appearance - no lesions, ulcers or calluses Skin - no sigificant pallor or erythema Monofilament testing - sensitive bilaterally in following locations:  Right - Great toe, medial, central, lateral ball and posterior foot intact  Left - Great toe, medial, central, lateral ball and posterior foot intact Pulses - +2 distally bilaterally Right mid foot: positve squeeze test and ttp over prox 1st MC. Normal appearing. Nl ankle. Nl heel   Commons side effects, risks, benefits, and alternatives for medications and treatment plan prescribed today were discussed, and the patient expressed understanding of the given instructions. Patient is instructed to call or message via MyChart if he/she has any questions or concerns regarding our treatment plan. No barriers to understanding were identified. We discussed Red Flag symptoms and signs in detail. Patient expressed understanding regarding what to do in case of urgent or emergency type symptoms.   Medication list was reconciled, printed and provided to the patient in AVS.  Patient instructions and summary information was reviewed with the patient as documented in the AVS. This note was prepared with assistance of Dragon voice recognition software. Occasional wrong-word or sound-a-like substitutions may have occurred due to the inherent limitations of voice recognition software

## 2018-10-11 ENCOUNTER — Encounter: Payer: Self-pay | Admitting: Family Medicine

## 2018-10-18 ENCOUNTER — Other Ambulatory Visit: Payer: Self-pay | Admitting: *Deleted

## 2018-10-18 ENCOUNTER — Telehealth: Payer: Self-pay | Admitting: Family Medicine

## 2018-10-18 MED ORDER — ROSUVASTATIN CALCIUM 5 MG PO TABS: 5.0000 mg | ORAL_TABLET | ORAL | 3 refills | Status: DC

## 2018-10-18 MED ORDER — ESTRADIOL 0.5 MG PO TABS: 0.5000 mg | ORAL_TABLET | Freq: Every day | ORAL | 3 refills | Status: DC

## 2018-10-18 NOTE — Telephone Encounter (Signed)
Pt aware RXs sent

## 2018-10-18 NOTE — Telephone Encounter (Signed)
See below

## 2018-10-18 NOTE — Telephone Encounter (Signed)
Pt would like you to transfer her estradiol (ESTRACE) 0.5 MG tablet  To her new pharmacy, and refill  rosuvastatin (CRESTOR) 5 MG tablet   [id number] - [place], [place] AT [address] [phone] (Phone) [phone] (Fax)

## 2018-10-27 ENCOUNTER — Encounter: Payer: Self-pay | Admitting: Family Medicine

## 2018-11-10 DIAGNOSIS — Z1231 Encounter for screening mammogram for malignant neoplasm of breast: Secondary | ICD-10-CM | POA: Diagnosis not present

## 2018-11-10 DIAGNOSIS — Z803 Family history of malignant neoplasm of breast: Secondary | ICD-10-CM | POA: Diagnosis not present

## 2018-11-10 DIAGNOSIS — Z780 Asymptomatic menopausal state: Secondary | ICD-10-CM | POA: Diagnosis not present

## 2018-11-10 DIAGNOSIS — R29890 Loss of height: Secondary | ICD-10-CM | POA: Diagnosis not present

## 2018-11-10 DIAGNOSIS — E2839 Other primary ovarian failure: Secondary | ICD-10-CM | POA: Diagnosis not present

## 2018-11-10 DIAGNOSIS — Z90710 Acquired absence of both cervix and uterus: Secondary | ICD-10-CM | POA: Diagnosis not present

## 2018-11-10 LAB — HM DEXA SCAN

## 2018-11-10 LAB — HM MAMMOGRAPHY

## 2018-11-13 ENCOUNTER — Encounter: Payer: Self-pay | Admitting: Family Medicine

## 2018-11-14 ENCOUNTER — Encounter: Payer: Self-pay | Admitting: Family Medicine

## 2018-11-16 ENCOUNTER — Encounter: Payer: Self-pay | Admitting: Family Medicine

## 2018-11-16 DIAGNOSIS — Z13820 Encounter for screening for osteoporosis: Secondary | ICD-10-CM | POA: Insufficient documentation

## 2018-11-19 ENCOUNTER — Other Ambulatory Visit: Payer: Self-pay | Admitting: Family Medicine

## 2018-11-21 DIAGNOSIS — H43813 Vitreous degeneration, bilateral: Secondary | ICD-10-CM | POA: Diagnosis not present

## 2018-11-21 DIAGNOSIS — H2513 Age-related nuclear cataract, bilateral: Secondary | ICD-10-CM | POA: Diagnosis not present

## 2018-11-21 DIAGNOSIS — H11001 Unspecified pterygium of right eye: Secondary | ICD-10-CM | POA: Diagnosis not present

## 2018-11-21 DIAGNOSIS — H52203 Unspecified astigmatism, bilateral: Secondary | ICD-10-CM | POA: Diagnosis not present

## 2018-11-22 ENCOUNTER — Encounter: Payer: Self-pay | Admitting: Family Medicine

## 2019-01-10 ENCOUNTER — Encounter: Payer: Self-pay | Admitting: Family Medicine

## 2019-01-10 ENCOUNTER — Ambulatory Visit (INDEPENDENT_AMBULATORY_CARE_PROVIDER_SITE_OTHER): Payer: Medicare HMO | Admitting: Family Medicine

## 2019-01-10 ENCOUNTER — Other Ambulatory Visit: Payer: Self-pay

## 2019-01-10 VITALS — BP 130/76 | HR 85 | Temp 98.2°F | Ht 65.5 in | Wt 202.5 lb

## 2019-01-10 DIAGNOSIS — E782 Mixed hyperlipidemia: Secondary | ICD-10-CM | POA: Diagnosis not present

## 2019-01-10 DIAGNOSIS — E119 Type 2 diabetes mellitus without complications: Secondary | ICD-10-CM

## 2019-01-10 DIAGNOSIS — E1169 Type 2 diabetes mellitus with other specified complication: Secondary | ICD-10-CM | POA: Diagnosis not present

## 2019-01-10 DIAGNOSIS — Z789 Other specified health status: Secondary | ICD-10-CM

## 2019-01-10 LAB — POCT GLYCOSYLATED HEMOGLOBIN (HGB A1C): Hemoglobin A1C: 5.6 % (ref 4.0–5.6)

## 2019-01-10 NOTE — Progress Notes (Signed)
Subjective  CC:  Chief Complaint  Patient presents with   Diabetes    HPI: Holly Riley is a 63 y.o. female who presents to the office today for follow up of diabetes and problems listed above in the chief complaint.   Diabetes follow up: Her diabetic control is reported as Improved.  At last visit, her diet-controlled diabetes had worsened mildly.  Since then, she has had a lifestyle change: Eating much more healthy foods, avoiding all processed foods.  Has lost 5 pounds.  Physically feels better.  No symptoms of hyperglycemia. She denies exertional CP or SOB or symptomatic hypoglycemia. She denies foot sores or paresthesias.  Eye exam was normal as of September 22, she sees Dr. Satira Sark.  Borderline  hypertension: With weight loss is also improved.  No chest pain or shortness of breath.  Low-sodium diet.  Hyperlipidemia on twice weekly statin: Still was having myalgias.  His stopped about a month ago and feels much better.  Also is concerned that statin had been increasing her sugars. Wt Readings from Last 3 Encounters:  01/10/19 202 lb 8 oz (91.9 kg)  10/10/18 207 lb 12.8 oz (94.3 kg)  02/09/18 202 lb (91.6 kg)    BP Readings from Last 3 Encounters:  01/10/19 130/76  10/10/18 (!) 142/78  02/09/18 120/78    Assessment  1. Controlled type 2 diabetes mellitus without complication, without long-term current use of insulin (HCC)   2. Statin intolerance   3. Combined hyperlipidemia associated with type 2 diabetes mellitus ([place])      Plan   Diabetes is currently very well controlled.  Diet controlled.  She may be able to resolve her diabetes if she continues with his healthy diet.  Encouraged.  Recheck 3 months  Borderline hypertension has resolved with weight loss.  Continue low-sodium diet.  Continue exercise  Hyperlipidemia: Remains intolerant to statin even at low dose.  We will continue to hold Crestor for now we will recheck cholesterol levels in 3 months.  Can continue  other anticholesterol medications at that time if needed.  Patient understands and agrees with care plan  Follow up: Return in about 3 months (around 04/12/2019) for follow up Diabetes.. Orders Placed This Encounter  Procedures   A1C POCT   No orders of the defined types were placed in this encounter.     Immunization History  Administered Date(s) Administered   DTaP 04/27/2006   Hepatitis B, ped/adol 04/28/1991   Influenza Split 12/26/2007   Influenza, Quadrivalent, Recombinant, Inj, Pf 01/22/2013   Influenza, Seasonal, Injecte, Preservative Fre 12/13/2013, 11/20/2014   Influenza,inj,Quad PF,6+ Mos 12/17/2015, 01/07/2019   Influenza-Unspecified 12/14/2016   Pneumococcal Conjugate-13 11/23/2018   Pneumococcal Polysaccharide-23 08/27/2014   Tdap 10/19/2014    Diabetes Related Lab Review: Lab Results  Component Value Date   HGBA1C 5.6 01/10/2019   HGBA1C 6.5 (A) 10/10/2018   HGBA1C 5.8 02/09/2018    Lab Results  Component Value Date   MICROALBUR <0.7 10/10/2018   Lab Results  Component Value Date   CREATININE 0.85 10/10/2018   BUN 11 10/10/2018   NA 139 10/10/2018   K 3.9 10/10/2018   CL 104 10/10/2018   CO2 26 10/10/2018   Lab Results  Component Value Date   CHOL 228 (H) 10/10/2018   CHOL 237 (H) 02/09/2018   CHOL 184 02/17/2017   Lab Results  Component Value Date   HDL 73.10 10/10/2018   HDL 69.20 02/09/2018   HDL 68.60 02/17/2017  Lab Results  Component Value Date   LDLCALC 137 (H) 10/10/2018   LDLCALC 146 (H) 02/09/2018   LDLCALC 98 02/17/2017   Lab Results  Component Value Date   TRIG 91.0 10/10/2018   TRIG 107.0 02/09/2018   TRIG 88.0 02/17/2017   Lab Results  Component Value Date   CHOLHDL 3 10/10/2018   CHOLHDL 3 02/09/2018   CHOLHDL 3 02/17/2017   No results found for: LDLDIRECT The 10-year ASCVD risk score Mikey Bussing DC Jr., et al., 2013) is: 10.3%   Values used to calculate the score:     Age: 31 years     Sex: Female      Is Non-Hispanic African American: No     Diabetic: Yes     Tobacco smoker: No     Systolic Blood Pressure: [id number] mmHg     Is BP treated: No     HDL Cholesterol: 73.1 mg/dL     Total Cholesterol: 228 mg/dL I have reviewed the PMH, Fam and Soc history. 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 She has never used smokeless tobacco. She reports that she does not drink alcohol or use drugs.  Review of Systems: Ophthalmic: negative for eye pain, loss of vision or double vision Cardiovascular: negative for chest pain Respiratory: negative for SOB or persistent cough Gastrointestinal: negative for abdominal pain Genitourinary: negative for dysuria or gross hematuria MSK: negative for foot lesions Neurologic: negative for weakness or gait disturbance  Objective  Vitals: BP 130/76 (BP Location: Left Arm, Patient Position: Sitting, Cuff Size: Large)    Pulse 85    Temp 98.2 F (36.8 C) (Temporal)    Ht 5' 5.5" (1.664 m)    Wt 202  lb 8 oz (91.9 kg)    SpO2 96%    BMI 33.19 kg/m  General: well appearing, no acute distress  Psych:  Alert and oriented, normal mood and affect HEENT:  Normocephalic, atraumatic, moist mucous membranes, supple neck  Cardiovascular:  Nl S1 and S2, RRR without murmur, gallop or rub. no edema     Diabetic education: ongoing education regarding chronic disease management for diabetes was given today. We continue to reinforce the ABC's of diabetic management: A1c (<7 or 8 dependent upon patient), tight blood pressure control, and cholesterol management with goal LDL < 100 minimally. We discuss diet strategies, exercise recommendations, medication options and possible side effects. At each visit, we review recommended  immunizations and preventive care recommendations for diabetics and stress that good diabetic control can prevent other problems. See below for this patient's data.    Commons side effects, risks, benefits, and alternatives for medications and treatment plan prescribed today were discussed, and the patient expressed understanding of the given instructions. Patient is instructed to call or message via MyChart if he/she has any questions or concerns regarding our treatment plan. No barriers to understanding were identified. We discussed Red Flag symptoms and signs in detail. Patient expressed understanding regarding what to do in case of urgent or emergency type symptoms.   Medication list was reconciled, printed and provided to the patient in AVS. Patient instructions and summary information was reviewed with the patient as documented in the AVS. This note was prepared with assistance of Dragon voice recognition software. Occasional wrong-word or sound-a-like substitutions may have occurred due to the inherent limitations of voice recognition software

## 2019-01-10 NOTE — Patient Instructions (Signed)
Please return in 3 months for diabetes follow up  Continue to hold the statin. We will recheck you cholesterol in 3 months.  Awesome work with lifestyle changes. It is making and will continue to make a huge difference. You will be able to resolve your diabetes if you continue.   If you have any questions or concerns, please don't hesitate to send me a message via MyChart or call the office at [phone]. Thank you for visiting with [place] today! It's our pleasure caring for you.

## 2019-03-16 ENCOUNTER — Encounter: Payer: Self-pay | Admitting: Family Medicine

## 2019-03-16 ENCOUNTER — Ambulatory Visit (INDEPENDENT_AMBULATORY_CARE_PROVIDER_SITE_OTHER): Payer: Medicare HMO | Admitting: Family Medicine

## 2019-03-16 VITALS — Temp 99.0°F | Ht 65.5 in | Wt 197.0 lb

## 2019-03-16 DIAGNOSIS — J029 Acute pharyngitis, unspecified: Secondary | ICD-10-CM

## 2019-03-16 MED ORDER — AZITHROMYCIN 250 MG PO TABS: ORAL_TABLET | ORAL | 0 refills | Status: DC

## 2019-03-16 NOTE — Progress Notes (Signed)
Virtual Visit via Video Note  Subjective  CC:  Chief Complaint  Patient presents with  . Sore Throat    started Monday  . Headache  . Left ear pain  . Fever     I connected with Holly Riley on 03/16/19 at  1:00 PM EST by a video enabled telemedicine application and verified that I am speaking with the correct person using two identifiers. Location patient: Home Location provider: [facility] [place], Office Persons participating in the virtual visit: Holly Riley, Leamon Arnt, MD Serita Sheller, [place] discussed the limitations of evaluation and management by telemedicine and the availability of in person appointments. The patient expressed understanding and agreed to proceed. HPI: Holly Riley is a 64 y.o. female who was contacted today to address the problems listed above in the chief complaint. . C/o ST, HA, body aches, low grade fever with tmax 100.4, with stabbing ear pain on right; feels like strep. No loss of smell or taste or sob. Last Thursday she was out running errands and the store and people in it were not wearing masks. No known covid exposures. Pt self isolates at home and prefers to NOT get tested for covid. No GI sxs.  Assessment  1. Sore throat      Plan   ST:  Pt believes viral or strep. Due to covid pandemic,defer testing and will treat empirically with zpak to cover for strep. She will monitor her symptoms and if not improving, will let me know and get testing. I placed the order and gave her the information to make an appt if needed. She is stable.  I discussed the assessment and treatment plan with the patient. The patient was provided an opportunity to ask questions and all were answered. The patient agreed with the plan and demonstrated an understanding of the instructions.   The patient was advised to call back or seek an in-person evaluation if the symptoms worsen or if the condition fails to improve as  anticipated. Follow up:   04/13/2019  Meds ordered this encounter  Medications  . azithromycin (ZITHROMAX) 250 MG tablet    Sig: Take 2 tabs today, then 1 tab daily for 4 days    Dispense:  1 each    Refill:  0      I reviewed the patients updated PMH, FH, and SocHx.    Patient Active Problem List   Diagnosis Date Noted  . Controlled type 2 diabetes mellitus without complication, without long-term current use of insulin ([place]) 02/17/2017    Priority: High  . Combined hyperlipidemia associated with type 2 diabetes mellitus ([place]) 02/17/2017    Priority: High  . Spinal stenosis     Priority: High  . Post-menopause on HRT (hormone replacement therapy) 09/11/2013    Priority: High  . Hypothyroidism 03/26/2013    Priority: High  . Obesity (BMI 30.0-34.9) 01/22/2013    Priority: High  . 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 Insomnia 09/11/2013    Priority: Medium  . Osteoarthritis 04/28/2011    Priority: Medium  . Vitamin D deficiency 03/20/2017    Priority: Low  . Allergic rhinitis 11/25/2009    Priority: Low  . Osteoporosis screening 11/16/2018  . Statin intolerance 03/20/2017   Current Meds  Medication Sig  . acetaminophen (TYLENOL) 500 MG tablet Take 500 mg by mouth every 6 (six) hours as needed.  . Cholecalciferol (  VITAMIN D3) 50 MCG (2000 UT) capsule Take 2,000 Units by mouth daily.   . Cinnamon 500 MG TABS Take 500 mg by mouth daily.  Marland Kitchen estradiol (ESTRACE) 0.5 MG tablet Take 1 tablet (0.5 mg total) by mouth daily.  . Flaxseed, Linseed, (FLAX SEED OIL PO) Take 1 tablet by mouth daily.  . fluticasone (FLONASE) 50 MCG/ACT nasal spray Place into the nose.  . Garlic 10 MG CAPS Take 10 mg by mouth daily.  Marland Kitchen levothyroxine (SYNTHROID) 75 MCG tablet TAKE 1 TABLET(75 MCG) BY MOUTH DAILY  . Magnesium 250 MG TABS Take 1 tablet by mouth daily.   Marland Kitchen MELATONIN PO Take 10 mg by mouth as needed.   . Omega-3 Fatty Acids (FISH OIL CONCENTRATE) 1000 MG CAPS Take  1,000 mg by mouth daily.  . Red Yeast Rice Extract 600 MG CAPS Take by mouth.  . TURMERIC PO Take by mouth.  . Zinc Sulfate (ZINC 15 PO) Take by mouth.    Allergies: Patient is allergic to morphine and related; statins; percocet [oxycodone-acetaminophen]; and vicodin [hydrocodone-acetaminophen]. Family History: Patient family history includes Cancer in her father, mother, and sister; Diabetes in her brother, father, and sister; Heart disease in her father; Hyperlipidemia in her father, mother, sister, and sister. Social History:  Patient  reports that she has never smoked. She has never used smokeless tobacco. She reports that she does not drink alcohol or use drugs.  Review of Systems: Constitutional: Negative for fever malaise or anorexia Cardiovascular: negative for chest pain Respiratory: negative for SOB or persistent cough Gastrointestinal: negative for abdominal pain  OBJECTIVE Vitals: Temp 99 F (37.2 C) (Temporal)   Ht 5' 5.5" (1.664 m)   Wt 197 lb (89.4 kg)   BMI 32.28 kg/m  General: no acute distress , A&Ox3, no respiratory distress  Leamon Arnt, MD

## 2019-03-20 ENCOUNTER — Ambulatory Visit: Payer: Medicare HMO | Attending: Internal Medicine

## 2019-03-20 DIAGNOSIS — Z20822 Contact with and (suspected) exposure to covid-19: Secondary | ICD-10-CM | POA: Diagnosis not present

## 2019-03-21 LAB — NOVEL CORONAVIRUS, NAA: SARS-CoV-2, NAA: NOT DETECTED

## 2019-03-22 ENCOUNTER — Encounter: Payer: Self-pay | Admitting: Family Medicine

## 2019-03-22 MED ORDER — BENZONATATE 100 MG PO CAPS: 100.0000 mg | ORAL_CAPSULE | Freq: Two times a day (BID) | ORAL | 0 refills | Status: DC | PRN

## 2019-03-22 MED ORDER — GUAIFENESIN-CODEINE 100-10 MG/5ML PO SOLN: 5.0000 mL | Freq: Four times a day (QID) | ORAL | 0 refills | Status: DC | PRN

## 2019-04-13 ENCOUNTER — Ambulatory Visit: Payer: Medicare HMO | Admitting: Family Medicine

## 2019-05-16 ENCOUNTER — Ambulatory Visit (INDEPENDENT_AMBULATORY_CARE_PROVIDER_SITE_OTHER): Payer: Medicare HMO | Admitting: Family Medicine

## 2019-05-16 ENCOUNTER — Encounter: Payer: Self-pay | Admitting: Family Medicine

## 2019-05-16 ENCOUNTER — Other Ambulatory Visit: Payer: Self-pay

## 2019-05-16 VITALS — BP 120/82 | HR 75 | Temp 97.3°F | Ht 65.5 in | Wt 199.2 lb

## 2019-05-16 DIAGNOSIS — Z79890 Hormone replacement therapy (postmenopausal): Secondary | ICD-10-CM | POA: Diagnosis not present

## 2019-05-16 DIAGNOSIS — E782 Mixed hyperlipidemia: Secondary | ICD-10-CM

## 2019-05-16 DIAGNOSIS — E119 Type 2 diabetes mellitus without complications: Secondary | ICD-10-CM | POA: Diagnosis not present

## 2019-05-16 DIAGNOSIS — Z789 Other specified health status: Secondary | ICD-10-CM | POA: Diagnosis not present

## 2019-05-16 DIAGNOSIS — E1169 Type 2 diabetes mellitus with other specified complication: Secondary | ICD-10-CM | POA: Diagnosis not present

## 2019-05-16 DIAGNOSIS — G4701 Insomnia due to medical condition: Secondary | ICD-10-CM

## 2019-05-16 DIAGNOSIS — E039 Hypothyroidism, unspecified: Secondary | ICD-10-CM | POA: Diagnosis not present

## 2019-05-16 DIAGNOSIS — E669 Obesity, unspecified: Secondary | ICD-10-CM | POA: Diagnosis not present

## 2019-05-16 LAB — POCT GLYCOSYLATED HEMOGLOBIN (HGB A1C): Hemoglobin A1C: 5.9 % — AB (ref 4.0–5.6)

## 2019-05-16 MED ORDER — EZETIMIBE 10 MG PO TABS: 10.0000 mg | ORAL_TABLET | Freq: Every day | ORAL | 3 refills | Status: DC

## 2019-05-16 MED ORDER — TRAZODONE HCL 50 MG PO TABS: 25.0000 mg | ORAL_TABLET | Freq: Every evening | ORAL | 5 refills | Status: DC | PRN

## 2019-05-16 NOTE — Progress Notes (Signed)
Subjective  CC:  Chief Complaint  Patient presents with  . Diabetes    does not check dm at home. does not take any medication for dm  . Hyperlipidemia    holding rosuvastin due to leg cramps  . Hypothyroidism    takes levothyroxine 75 mcg daily. no side effects    HPI: Holly Riley is a 64 y.o. female who presents to the office today for follow up of diabetes and problems listed above in the chief complaint.   Diabetes follow up: Her diabetic control is reported as Unchanged. Diet controlled. Eating well /wo sxs of hyperglycemia. Actually will go low at times.  She denies exertional CP or SOB or symptomatic hypoglycemia. She denies foot sores or paresthesias.   Weight is stable. Weighed 195 this am at home. Weight loss has plateaued  HLD: intolerant to statins. Would be open to zetia trial.  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 Stays hot. On HRT. No anxiety concerns  Low thyroid w/o sxs. Has been well controlled.   No longer with elevated bps.   Wt Readings from Last 3 Encounters:  05/16/19 199 lb 3.2 oz (90.4 kg)  03/16/19 197 lb (89.4 kg)  01/10/19 202 lb 8 oz (91.9 kg)    BP Readings from Last 3 Encounters:  05/16/19 120/82  01/10/19 130/76  10/10/18 (!) 142/78    Assessment  1. Controlled type 2 diabetes mellitus without complication, without long-term current use of insulin ([place])   2. Combined hyperlipidemia associated with type 2 diabetes mellitus ([place])   3. Acquired hypothyroidism   4. Obesity (BMI 30.0-34.9)   5. Statin intolerance   6. Insomnia due to medical condition   7. Post-menopause on HRT (hormone replacement therapy)      Plan   Diabetes is currently very well controlled. Diet controlled.   HLD intolerant to statins after 3 different trials, even low dose and intermittent. Start trial of zetia  Low thyroid is controlled on supplements  Obesity: will continue to work on weight loss.    Insomnia and postmenopausal sxs improved on HRT: add trazodone.  Follow up: august 2021. Orders Placed This Encounter  Procedures  . POCT glycosylated hemoglobin (Hb A1C)   Meds ordered this encounter  Medications  . ezetimibe (ZETIA) 10 MG tablet    Sig: Take 1 tablet (10 mg total) by mouth daily.    Dispense:  90 tablet    Refill:  3  . traZODone (DESYREL) 50 MG tablet    Sig: Take 0.5-1 tablets (25-50 mg total) by mouth at bedtime as needed for sleep.    Dispense:  30 tablet    Refill:  5      Immunization History  Administered Date(s) Administered  . DTaP 04/27/2006  . Hepatitis B, ped/adol 04/28/1991  . Influenza Split 12/26/2007  . Influenza, Quadrivalent, Recombinant, Inj, Pf 01/22/2013  . Influenza, Seasonal, Injecte, Preservative Fre 12/13/2013, 11/20/2014  . Influenza,inj,Quad PF,6+ Mos 12/17/2015, 01/07/2019  . Influenza-Unspecified 12/14/2016  . Pneumococcal Conjugate-13 11/23/2018  . Pneumococcal Polysaccharide-23 08/27/2014  . Tdap 10/19/2014    Diabetes Related Lab Review: Lab Results  Component Value Date   HGBA1C 5.9 (A) 05/16/2019   HGBA1C 5.6 01/10/2019   HGBA1C 6.5 (A) 10/10/2018    Lab Results  Component Value Date   MICROALBUR <0.7 10/10/2018   Lab Results  Component Value Date   CREATININE 0.85 10/10/2018   BUN 11 10/10/2018   NA 139  10/10/2018   K 3.9 10/10/2018   CL 104 10/10/2018   CO2 26 10/10/2018   Lab Results  Component Value Date   CHOL 228 (H) 10/10/2018   CHOL 237 (H) 02/09/2018   CHOL 184 02/17/2017   Lab Results  Component Value Date   HDL 73.10 10/10/2018   HDL 69.20 02/09/2018   HDL 68.60 02/17/2017   Lab Results  Component Value Date   LDLCALC 137 (H) 10/10/2018   LDLCALC 146 (H) 02/09/2018   LDLCALC 98 02/17/2017   Lab Results  Component Value Date   TRIG 91.0 10/10/2018   TRIG 107.0 02/09/2018   TRIG 88.0 02/17/2017   Lab Results  Component Value Date   CHOLHDL 3 10/10/2018   CHOLHDL 3  02/09/2018   CHOLHDL 3 02/17/2017   No results found for: LDLDIRECT The 10-year ASCVD risk score Mikey Bussing DC Jr., et al., 2013) is: 8.9%   Values used to calculate the score:     Age: 22 years     Sex: Female     Is Non-Hispanic African American: No     Diabetic: Yes     Tobacco smoker: No     Systolic Blood Pressure: 123456 mmHg     Is BP treated: No     HDL Cholesterol: 73.1 mg/dL     Total Cholesterol: 228 mg/dL I have reviewed the PMH, Fam and Soc history. Patient Active Problem List   Diagnosis Date Noted  . Statin intolerance 03/20/2017    Priority: High    Myalgias, simvistatin and crestor   . Controlled type 2 diabetes mellitus without complication, without long-term current use of insulin ([place]) 02/17/2017    Priority: High  . Combined hyperlipidemia associated with type 2 diabetes mellitus ([place]) 02/17/2017    Priority: High  . Spinal stenosis     Priority: High    NS - early, MRI   . Post-menopause on HRT (hormone replacement therapy) 09/11/2013    Priority: High  . Hypothyroidism 03/26/2013    Priority: High  . Obesity (BMI 30.0-34.9) 01/22/2013    Priority: High  . 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 Insomnia 09/11/2013    Priority: Medium  . Osteoarthritis 04/28/2011    Priority: Medium  . Vitamin D deficiency 03/20/2017    Priority: Low  . Allergic rhinitis 11/25/2009    Priority: Low  . Osteoporosis screening 11/16/2018    Normal bone density 2020; recheck 2025     Social History: Patient  reports that she has never smoked. She has never used smokeless tobacco. She reports that she does not drink alcohol or use drugs.  Review of Systems: Ophthalmic: negative for eye pain, loss of vision or double vision Cardiovascular: negative for chest pain Respiratory: negative for SOB or persistent cough Gastrointestinal: negative for abdominal pain Genitourinary: negative for dysuria or gross hematuria MSK: negative for foot  lesions Neurologic: negative for weakness or gait disturbance  Objective  Vitals: BP 120/82 (BP Location: Left Arm, Patient Position: Sitting, Cuff Size: Normal)   Pulse 75   Temp (!) 97.3 F (36.3 C) (Temporal)   Ht 5' 5.5" (1.664 m)   Wt 199 lb 3.2 oz (90.4 kg)   SpO2 98%   BMI 32.64 kg/m  General: well appearing, no acute distress  Psych:  Alert and oriented, normal mood and affect   Commons side effects, risks, benefits, and alternatives for medications and treatment plan prescribed today were discussed, and the patient expressed  understanding of the given instructions. Patient is instructed to call or message via MyChart if he/she has any questions or concerns regarding our treatment plan. No barriers to understanding were identified. We discussed Red Flag symptoms and signs in detail. Patient expressed understanding regarding what to do in case of urgent or emergency type symptoms.   Medication list was reconciled, printed and provided to the patient in AVS. Patient instructions and summary information was reviewed with the patient as documented in the AVS. This note was prepared with assistance of Dragon voice recognition software. Occasional wrong-word or sound-a-like substitutions may have occurred due to the inherent limitations of voice recognition software  This visit occurred during the SARS-CoV-2 public health emergency.  Safety protocols were in place, including screening questions prior to the visit, additional usage of staff PPE, and extensive cleaning of exam room while observing appropriate contact time as indicated for disinfecting solutions.

## 2019-05-16 NOTE — Patient Instructions (Signed)
Please return in august 2021 for your annual complete physical; please come fasting.  Try the trazodone for sleep.  Start the zetia to help lower your cholesterol. Keep eating well and working on weight loss. Your prediabetes is stable.   If you have any questions or concerns, please don't hesitate to send me a message via MyChart or call the office at [phone]. Thank you for visiting with [place] today! It's our pleasure caring for you.

## 2019-07-24 DIAGNOSIS — D692 Other nonthrombocytopenic purpura: Secondary | ICD-10-CM | POA: Diagnosis not present

## 2019-07-24 DIAGNOSIS — D2272 Melanocytic nevi of left lower limb, including hip: Secondary | ICD-10-CM | POA: Diagnosis not present

## 2019-07-24 DIAGNOSIS — L821 Other seborrheic keratosis: Secondary | ICD-10-CM | POA: Diagnosis not present

## 2019-07-24 DIAGNOSIS — D2262 Melanocytic nevi of left upper limb, including shoulder: Secondary | ICD-10-CM | POA: Diagnosis not present

## 2019-07-24 DIAGNOSIS — Z85828 Personal history of other malignant neoplasm of skin: Secondary | ICD-10-CM | POA: Diagnosis not present

## 2019-07-24 DIAGNOSIS — D2372 Other benign neoplasm of skin of left lower limb, including hip: Secondary | ICD-10-CM | POA: Diagnosis not present

## 2019-07-24 DIAGNOSIS — D1801 Hemangioma of skin and subcutaneous tissue: Secondary | ICD-10-CM | POA: Diagnosis not present

## 2019-07-24 DIAGNOSIS — D225 Melanocytic nevi of trunk: Secondary | ICD-10-CM | POA: Diagnosis not present

## 2019-07-24 DIAGNOSIS — D2271 Melanocytic nevi of right lower limb, including hip: Secondary | ICD-10-CM | POA: Diagnosis not present

## 2019-07-31 DIAGNOSIS — Z1211 Encounter for screening for malignant neoplasm of colon: Secondary | ICD-10-CM | POA: Diagnosis not present

## 2019-07-31 DIAGNOSIS — K219 Gastro-esophageal reflux disease without esophagitis: Secondary | ICD-10-CM | POA: Diagnosis not present

## 2019-07-31 DIAGNOSIS — K5730 Diverticulosis of large intestine without perforation or abscess without bleeding: Secondary | ICD-10-CM | POA: Diagnosis not present

## 2019-07-31 DIAGNOSIS — Z86010 Personal history of colonic polyps: Secondary | ICD-10-CM | POA: Diagnosis not present

## 2019-07-31 LAB — HEPATIC FUNCTION PANEL
ALT: 21 (ref 7–35)
AST: 17 (ref 13–35)
Alkaline Phosphatase: 88 (ref 25–125)

## 2019-07-31 LAB — BASIC METABOLIC PANEL
BUN: 14 (ref 4–21)
CO2: 22 (ref 13–22)
Chloride: 101 (ref 99–108)
Creatinine: 0.9 (ref 0.5–1.1)
Glucose: 90
Potassium: 4.2 (ref 3.4–5.3)
Sodium: 139 (ref 137–147)

## 2019-07-31 LAB — CBC AND DIFFERENTIAL
HCT: 45 (ref 36–46)
Hemoglobin: 15.2 (ref 12.0–16.0)
Platelets: 273 (ref 150–399)
WBC: 7.9

## 2019-07-31 LAB — COMPREHENSIVE METABOLIC PANEL
Calcium: 9.1 (ref 8.7–10.7)
GFR calc Af Amer: 74
GFR calc non Af Amer: 64

## 2019-07-31 LAB — CBC: RBC: 4.66 (ref 3.87–5.11)

## 2019-08-01 LAB — TSH: TSH: 1.75 (ref 0.41–5.90)

## 2019-08-07 ENCOUNTER — Other Ambulatory Visit: Payer: Self-pay

## 2019-08-07 ENCOUNTER — Ambulatory Visit (INDEPENDENT_AMBULATORY_CARE_PROVIDER_SITE_OTHER): Payer: Medicare HMO

## 2019-08-07 ENCOUNTER — Ambulatory Visit
Admission: EM | Admit: 2019-08-07 | Discharge: 2019-08-07 | Disposition: A | Discharge status: Home / Self Care | Payer: Medicare HMO | Attending: Physician Assistant | Admitting: Physician Assistant

## 2019-08-07 DIAGNOSIS — M79645 Pain in left finger(s): Secondary | ICD-10-CM

## 2019-08-07 DIAGNOSIS — M79642 Pain in left hand: Secondary | ICD-10-CM

## 2019-08-07 DIAGNOSIS — S6992XA Unspecified injury of left wrist, hand and finger(s), initial encounter: Secondary | ICD-10-CM | POA: Diagnosis not present

## 2019-08-07 NOTE — ED Provider Notes (Signed)
[name]    CSN: [id number] Arrival date & time: 08/07/19  1328      History   Chief Complaint Chief Complaint  Patient presents with  . Finger Injury    HPI Holly Riley is a 64 y.o. female.   64 year old female comes in for continued left hand/finger pain after fall injury 3 weeks ago.  Has puncture wound to the distal hand from tree branch without erythema, warmth, discharge.  Has pain to the 2nd-4th finger, mainly to the distal MCP.  Decreased flexion due to swelling and pain.  Denies numbness, tingling.  Decreased grip strength due to pain, denies loss of grip strength. RHD     Past Medical History:  Diagnosis Date  . Arthritis   . Hyperlipidemia   . Spinal stenosis    NS - early, MRI  . Thyroid disease   . Type 2 diabetes mellitus with hyperlipidemia [facility])     Patient Active Problem List   Diagnosis Date Noted  . Osteoporosis screening 11/16/2018  . Statin intolerance 03/20/2017  . Vitamin D deficiency 03/20/2017  . Controlled type 2 diabetes mellitus without complication, without long-term current use of insulin ([place]) 02/17/2017  . Combined hyperlipidemia associated with type 2 diabetes mellitus ([place]) 02/17/2017  . Spinal stenosis   . 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 Insomnia 09/11/2013  . Post-menopause on HRT (hormone replacement therapy) 09/11/2013  . Hypothyroidism 03/26/2013  . Obesity (BMI 30.0-34.9) 01/22/2013  . Osteoarthritis 04/28/2011  . Allergic rhinitis 11/25/2009    Past Surgical History:  Procedure Laterality Date  . ABDOMINAL HYSTERECTOMY    . CARPAL TUNNEL RELEASE Right   . CERVICAL FUSION     [place]  . KNEE SURGERY Left   . LUMBAR LAMINECTOMY     x 2, L4, L5, [place]  . SPINE SURGERY    . TONSILLECTOMY AND ADENOIDECTOMY      OB History   No obstetric history on file.      Home Medications    Prior to Admission medications   Medication Sig Start Date End Date Taking? Authorizing Provider    acetaminophen (TYLENOL) 500 MG tablet Take 500 mg by mouth every 6 (six) hours as needed.    [provider]  benzonatate (TESSALON) 100 MG capsule Take 1 capsule (100 mg total) by mouth 2 (two) times daily as needed for cough. 03/22/19   Leamon Arnt, MD  Cholecalciferol (VITAMIN D3) 50 MCG (2000 UT) capsule Take 2,000 Units by mouth daily.     [provider]  Cinnamon 500 MG TABS Take 500 mg by mouth daily.    [provider]  estradiol (ESTRACE) 0.5 MG tablet Take 1 tablet (0.5 mg total) by mouth daily. 10/18/18   Leamon Arnt, MD  ezetimibe (ZETIA) 10 MG tablet Take 1 tablet (10 mg total) by mouth daily. 05/16/19   Leamon Arnt, MD  Flaxseed, Linseed, (FLAX SEED OIL PO) Take 1 tablet by mouth daily.    [provider]  fluticasone (FLONASE) 50 MCG/ACT nasal spray Place into the nose.    [provider]  Garlic 10 MG CAPS Take 10 mg by mouth daily.    [provider]  guaiFENesin-codeine 100-10 MG/5ML syrup Take 5 mLs by mouth every 6 (six) hours as needed for cough. 03/22/19   Leamon Arnt, MD  levothyroxine (SYNTHROID) 75 MCG tablet TAKE 1 TABLET(75 MCG) BY MOUTH DAILY 11/20/18   Leamon Arnt, MD  Magnesium 250  MG TABS Take 1 tablet by mouth daily.     [provider]  MELATONIN PO Take 10 mg by mouth as needed.     [provider]  Omega-3 Fatty Acids (FISH OIL CONCENTRATE) 1000 MG CAPS Take 1,000 mg by mouth daily.    [provider]  Red Yeast Rice Extract 600 MG CAPS Take by mouth.    [provider]  traZODone (DESYREL) 50 MG tablet Take 0.5-1 tablets (25-50 mg total) by mouth at bedtime as needed for sleep. 05/16/19   Leamon Arnt, MD  TURMERIC PO Take by mouth.    [provider]  Zinc Sulfate (ZINC 15 PO) Take by mouth.    [provider]    Family History Family History  Problem Relation Age of Onset  . Cancer Mother   . Hyperlipidemia Mother   . Cancer  Father   . Diabetes Father   . Heart disease Father   . Hyperlipidemia Father   . Cancer Sister   . Diabetes Sister   . Hyperlipidemia Sister   . Diabetes Brother   . Hyperlipidemia Sister     Social History Social History   Tobacco Use  . Smoking status: Never Smoker  . Smokeless tobacco: Never Used  Substance Use Topics  . Alcohol use: No  . Drug use: No     Allergies   Morphine and related, Statins, Percocet [oxycodone-acetaminophen], and Vicodin [hydrocodone-acetaminophen]   Review of Systems Review of Systems  Reason unable to perform ROS: See HPI as above.     Physical Exam Triage Vital Signs ED Triage Vitals [08/07/19 1346]  Enc Vitals Group     BP (!) 167/89     Pulse Rate 76     Resp 18     Temp 98.1 F (36.7 C)     Temp Source Oral     SpO2 96 %     Weight      Height      Head Circumference      Peak Flow      Pain Score 0     Pain Loc      Pain Edu?      Excl. in [place]?    No data found.  Updated Vital Signs BP (!) 167/89 (BP Location: Left Arm)   Pulse 76   Temp 98.1 F (36.7 C) (Oral)   Resp 18   SpO2 96%   Physical Exam Constitutional:      General: She is not in acute distress.    Appearance: Normal appearance. She is well-developed. She is not toxic-appearing or diaphoretic.  HENT:     Head: Normocephalic and atraumatic.  Eyes:     Conjunctiva/sclera: Conjunctivae normal.     Pupils: Pupils are equal, round, and reactive to light.  Pulmonary:     Effort: Pulmonary effort is normal. No respiratory distress.     Comments: Speaking in full sentences without difficulty Musculoskeletal:       Hands:     Cervical back: Normal range of motion and neck supple.     Comments: Tenderness to palpation to 3rd, 4th MCP/PIP. Decreased flexion. Sensation 5/5 bilaterally. Radial pulse 2+  Skin:    General: Skin is warm and dry.  Neurological:     Mental Status: She is alert and oriented to person, place, and time.      UC Treatments  / Results  Labs (all labs ordered are listed, but only abnormal results are displayed)  Labs Reviewed - No data to display  EKG   Radiology DG Hand Complete Left  Result Date: 08/07/2019 CLINICAL DATA:  Injury 3 weeks ago, pain and limited range of motion to middle and ring fingers, fell while hiking EXAM: LEFT HAND - COMPLETE 3+ VIEW COMPARISON:  None FINDINGS: Osseous mineralization low normal. Joint spaces preserved. Questionable nondisplaced volar plate avulsion fracture at base of middle phalanx ring finger, seen only on a single view. No additional fracture, dislocation, or bone destruction. IMPRESSION: Questionable nondisplaced volar plate avulsion fracture at base of middle phalanx LEFT ring finger. Electronically Signed   By: Lavonia Dana M.D.   On: 08/07/2019 14:39    Procedures Procedures (including critical care time)  Medications Ordered in UC Medications - No data to display  Initial Impression / Assessment and Plan / UC Course  I have reviewed the triage vital signs and the nursing notes.  Pertinent labs & imaging results that were available during my care of the patient were reviewed by me and considered in my medical decision making (see chart for details).    Discussed xray results with patient. Will provide splint. Patient to rest, ice, NSAIDs/tylenol for pain. If symptoms not improving, to follow up with orthopedics for further evaluation needed. Return precautions given.  Final Clinical Impressions(s) / UC Diagnoses   Final diagnoses:  Left hand pain    ED Prescriptions    None     PDMP not reviewed this encounter.   Ok Edwards, PA-C 08/07/19 1535

## 2019-08-07 NOTE — ED Triage Notes (Signed)
Pt states went hiking 3wks ago and her lt hand/fingers. Pt states limited mobility to lt 2-4 digits.

## 2019-08-07 NOTE — Discharge Instructions (Addendum)
As discussed, xray with questionable fracture. Finger splint, rest, ice compress. Tylenol/motrin as needed. Follow up with hand ortho/sports medicine if symptoms not improving.

## 2019-08-09 DIAGNOSIS — S63639D Sprain of interphalangeal joint of unspecified finger, subsequent encounter: Secondary | ICD-10-CM | POA: Diagnosis not present

## 2019-08-09 DIAGNOSIS — S63639A Sprain of interphalangeal joint of unspecified finger, initial encounter: Secondary | ICD-10-CM | POA: Diagnosis not present

## 2019-08-09 DIAGNOSIS — M79642 Pain in left hand: Secondary | ICD-10-CM | POA: Diagnosis not present

## 2019-08-23 DIAGNOSIS — S63634D Sprain of interphalangeal joint of right ring finger, subsequent encounter: Secondary | ICD-10-CM | POA: Diagnosis not present

## 2019-08-23 DIAGNOSIS — S63639A Sprain of interphalangeal joint of unspecified finger, initial encounter: Secondary | ICD-10-CM | POA: Diagnosis not present

## 2019-08-23 DIAGNOSIS — S63636D Sprain of interphalangeal joint of right little finger, subsequent encounter: Secondary | ICD-10-CM | POA: Diagnosis not present

## 2019-08-23 DIAGNOSIS — S63632D Sprain of interphalangeal joint of right middle finger, subsequent encounter: Secondary | ICD-10-CM | POA: Diagnosis not present

## 2019-09-26 ENCOUNTER — Telehealth: Payer: Self-pay | Admitting: Family Medicine

## 2019-09-26 NOTE — Telephone Encounter (Signed)
Left message for patient to call back and schedule Medicare Annual Wellness Visit (AWV) either virtually/audio only OR in office. Whatever the patients preference is.  No hx; please schedule at anytime with [facility]-Nurse Health Advisor at [facility] [facility].  This should be a 45 minute visit.   

## 2019-09-28 ENCOUNTER — Encounter: Payer: Self-pay | Admitting: Family Medicine

## 2019-09-28 DIAGNOSIS — Z86010 Personal history of colonic polyps: Secondary | ICD-10-CM | POA: Diagnosis not present

## 2019-09-28 DIAGNOSIS — Z800 Family history of malignant neoplasm of digestive organs: Secondary | ICD-10-CM | POA: Diagnosis not present

## 2019-09-28 DIAGNOSIS — K621 Rectal polyp: Secondary | ICD-10-CM | POA: Diagnosis not present

## 2019-09-28 DIAGNOSIS — D122 Benign neoplasm of ascending colon: Secondary | ICD-10-CM | POA: Diagnosis not present

## 2019-09-28 DIAGNOSIS — K635 Polyp of colon: Secondary | ICD-10-CM | POA: Diagnosis not present

## 2019-09-28 DIAGNOSIS — Z1211 Encounter for screening for malignant neoplasm of colon: Secondary | ICD-10-CM | POA: Diagnosis not present

## 2019-09-28 DIAGNOSIS — K5730 Diverticulosis of large intestine without perforation or abscess without bleeding: Secondary | ICD-10-CM | POA: Diagnosis not present

## 2019-10-18 ENCOUNTER — Ambulatory Visit (INDEPENDENT_AMBULATORY_CARE_PROVIDER_SITE_OTHER): Payer: Medicare HMO | Admitting: Family Medicine

## 2019-10-18 ENCOUNTER — Other Ambulatory Visit: Payer: Self-pay

## 2019-10-18 ENCOUNTER — Encounter: Payer: Self-pay | Admitting: Family Medicine

## 2019-10-18 VITALS — BP 146/92 | HR 92 | Temp 97.2°F | Resp 15 | Ht 66.0 in | Wt 193.8 lb

## 2019-10-18 DIAGNOSIS — G4701 Insomnia due to medical condition: Secondary | ICD-10-CM | POA: Diagnosis not present

## 2019-10-18 DIAGNOSIS — Z789 Other specified health status: Secondary | ICD-10-CM | POA: Diagnosis not present

## 2019-10-18 DIAGNOSIS — E119 Type 2 diabetes mellitus without complications: Secondary | ICD-10-CM | POA: Diagnosis not present

## 2019-10-18 DIAGNOSIS — E039 Hypothyroidism, unspecified: Secondary | ICD-10-CM

## 2019-10-18 DIAGNOSIS — E782 Mixed hyperlipidemia: Secondary | ICD-10-CM | POA: Diagnosis not present

## 2019-10-18 DIAGNOSIS — E559 Vitamin D deficiency, unspecified: Secondary | ICD-10-CM | POA: Diagnosis not present

## 2019-10-18 DIAGNOSIS — Z79890 Hormone replacement therapy (postmenopausal): Secondary | ICD-10-CM

## 2019-10-18 DIAGNOSIS — E1169 Type 2 diabetes mellitus with other specified complication: Secondary | ICD-10-CM | POA: Diagnosis not present

## 2019-10-18 DIAGNOSIS — Z0000 Encounter for general adult medical examination without abnormal findings: Secondary | ICD-10-CM

## 2019-10-18 LAB — POCT GLYCOSYLATED HEMOGLOBIN (HGB A1C): Hemoglobin A1C: 5.8 % — AB (ref 4.0–5.6)

## 2019-10-18 NOTE — Progress Notes (Signed)
Subjective  Chief Complaint  Patient presents with  . Annual Exam    fasting   . Diabetes  . Hyperlipidemia  . Hypothyroidism  . Insomnia    HPI: Holly Riley is a 64 y.o. female who presents to [place] [facility] today for a Female Wellness Visit. She also has the concerns and/or needs as listed above in the chief complaint. These will be addressed in addition to the Health Maintenance Visit.   Wellness Visit: annual visit with health maintenance review and exam without Pap   HM: HM is up to date. mammo and eye exam due next month. Defer 2nd pneumovax until after covid. Declines covid vaccine.   Sad: father passed away last night. Expected death. Passed peacefully age 43.   Chronic disease f/u and/or acute problem visit: (deemed necessary to be done in addition to the wellness visit):  Diabetes follow up: Her diabetic control is reported as Unchanged. Diet controlled.  She denies exertional CP or SOB or symptomatic hypoglycemia. She denies foot sores or paresthesias.  Insomnia: added trazadone last visit: didn't help very much; took 44. No AEs. Chronic sleep issues  Hypothyroidism: due for recheck but feels it is well controlled. Compliant with medications  HLD: started zetia last visit:  Tolerating well. Statin intolerant. Fasting today.    Immunization History  Administered Date(s) Administered  . DTaP 04/27/2006  . Hepatitis B, ped/adol 04/28/1991  . Influenza Split 12/26/2007  . Influenza, Quadrivalent, Recombinant, Inj, Pf 01/22/2013  . Influenza, Seasonal, Injecte, Preservative Fre 12/13/2013, 11/20/2014  . Influenza,inj,Quad PF,6+ Mos 12/17/2015, 01/07/2019  . Influenza-Unspecified 12/14/2016  . Pneumococcal Conjugate-13 11/23/2018  . Pneumococcal Polysaccharide-23 08/27/2014  . Tdap 10/19/2014    Diabetes Related Lab Review: Lab Results  Component Value Date   HGBA1C 5.8 (A) 10/18/2019   HGBA1C 5.9 (A) 05/16/2019   HGBA1C 5.6  01/10/2019    Lab Results  Component Value Date   MICROALBUR <0.2 10/18/2019   Lab Results  Component Value Date   CREATININE 0.92 10/18/2019   BUN 18 10/18/2019   NA 138 10/18/2019   K 4.3 10/18/2019   CL 102 10/18/2019   CO2 28 10/18/2019   Lab Results  Component Value Date   CHOL 214 (H) 10/18/2019   CHOL 228 (H) 10/10/2018   CHOL 237 (H) 02/09/2018   Lab Results  Component Value Date   HDL 79 10/18/2019   HDL 73.10 10/10/2018   HDL 69.20 02/09/2018   Lab Results  Component Value Date   LDLCALC 115 (H) 10/18/2019   LDLCALC 137 (H) 10/10/2018   LDLCALC 146 (H) 02/09/2018   Lab Results  Component Value Date   TRIG 92 10/18/2019   TRIG 91.0 10/10/2018   TRIG 107.0 02/09/2018   Lab Results  Component Value Date   CHOLHDL 2.7 10/18/2019   CHOLHDL 3 10/10/2018   CHOLHDL 3 02/09/2018   No results found for: LDLDIRECT The 10-year ASCVD risk score Mikey Bussing DC Jr., et al., 2013) is: 13.4%   Values used to calculate the score:     Age: 76 years     Sex: Female     Is Non-Hispanic African American: No     Diabetic: Yes     Tobacco smoker: No     Systolic Blood Pressure: 301 mmHg     Is BP treated: No     HDL Cholesterol: 79 mg/dL     Total Cholesterol: 214 mg/dL  BP Readings from Last 3 Encounters:  10/18/19 (!) 146/92  08/07/19 (!) 167/89  05/16/19 120/82   Wt Readings from Last 3 Encounters:  10/18/19 193 lb 12.8 oz (87.9 kg)  05/16/19 199 lb 3.2 oz (90.4 kg)  03/16/19 197 lb (89.4 kg)    Health Maintenance  Topic Date Due  . COVID-19 Vaccine (1) Never done  . INFLUENZA VACCINE  09/30/2019  . MAMMOGRAM  11/10/2019  . OPHTHALMOLOGY EXAM  11/21/2019  . PNA vac Low Risk Adult (2 of 2 - PPSV23) 11/23/2019  . HEMOGLOBIN A1C  04/19/2020  . COLONOSCOPY  06/21/2020  . FOOT EXAM  10/17/2020  . URINE MICROALBUMIN  10/17/2020  . DEXA SCAN  11/10/2023  . TETANUS/TDAP  10/18/2024  . Hepatitis C Screening  Completed     Assessment  1. Controlled type 2  diabetes mellitus without complication, without long-term current use of insulin ([place])   2. Annual physical exam   3. Combined hyperlipidemia associated with type 2 diabetes mellitus ([facility])   4. Statin intolerance   5. Acquired hypothyroidism   6. Post-menopause on HRT (hormone replacement therapy)   7. Insomnia due to medical condition   8. Vitamin D deficiency      Plan  Female Wellness Visit:  Age appropriate Health Maintenance and Prevention measures were discussed with patient. Included topics are cancer screening recommendations, ways to keep healthy (see AVS) including dietary and exercise recommendations, regular eye and dental care, use of seat belts, and avoidance of moderate alcohol use and tobacco use. For mammo next month. Will need 2nd pneumovax at upcoming visit .  BMI: discussed patient's BMI and encouraged positive lifestyle modifications to help get to or maintain a target BMI.  HM needs and immunizations were addressed and ordered. See below for orders. See HM and immunization section for updates.  Routine labs and screening tests ordered including cmp, cbc and lipids where appropriate.  Discussed recommendations regarding Vit D and calcium supplementation (see AVS)  Chronic disease management visit and/or acute problem visit:  DM is diet controlled. Check labs and urine. Eye exam next month. No complications  HDL: check on zetia  Recheck thyroid. Clinically euthyroid  Hrt: stable. Needs mammo  Insomnia: increase dose of trazadone to 100 nitghtly  Recheck vit D levels .  Follow up: Return in about 3 months (around 01/18/2020) for follow up of diabetes and hypertension.  Orders Placed This Encounter  Procedures  . CBC with Differential/Platelet  . Comprehensive metabolic panel  . Lipid panel  . TSH  . Microalbumin / creatinine urine ratio  . VITAMIN D 25 Hydroxy (Vit-D Deficiency, Fractures)  . POCT HgB A1C   No orders of the defined types were placed  in this encounter.     Lifestyle: Body mass index is 31.28 kg/m. Wt Readings from Last 3 Encounters:  10/18/19 193 lb 12.8 oz (87.9 kg)  05/16/19 199 lb 3.2 oz (90.4 kg)  03/16/19 197 lb (89.4 kg)    Patient Active Problem List   Diagnosis Date Noted  . Statin intolerance 03/20/2017    Priority: High    Myalgias, simvistatin and crestor   . Controlled type 2 diabetes mellitus without complication, without long-term current use of insulin ([place]) 02/17/2017    Priority: High  . Combined hyperlipidemia associated with type 2 diabetes mellitus ([place]) 02/17/2017    Priority: High  . Spinal stenosis     Priority: High    NS - early, MRI   . Post-menopause on HRT (hormone replacement therapy) 09/11/2013  Priority: High  . Hypothyroidism 03/26/2013    Priority: High  . Obesity (BMI 30.0-34.9) 01/22/2013    Priority: High  . 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 Insomnia 09/11/2013    Priority: Medium  . Osteoarthritis 04/28/2011    Priority: Medium  . Vitamin D deficiency 03/20/2017    Priority: Low  . Allergic rhinitis 11/25/2009    Priority: Low  . Osteoporosis screening 11/16/2018    Normal bone density 2020; recheck 2025    Health Maintenance  Topic Date Due  . COVID-19 Vaccine (1) Never done  . INFLUENZA VACCINE  09/30/2019  . MAMMOGRAM  11/10/2019  . OPHTHALMOLOGY EXAM  11/21/2019  . PNA vac Low Risk Adult (2 of 2 - PPSV23) 11/23/2019  . HEMOGLOBIN A1C  04/19/2020  . COLONOSCOPY  06/21/2020  . FOOT EXAM  10/17/2020  . URINE MICROALBUMIN  10/17/2020  . DEXA SCAN  11/10/2023  . TETANUS/TDAP  10/18/2024  . Hepatitis C Screening  Completed   Immunization History  Administered Date(s) Administered  . DTaP 04/27/2006  . Hepatitis B, ped/adol 04/28/1991  . Influenza Split 12/26/2007  . Influenza, Quadrivalent, Recombinant, Inj, Pf 01/22/2013  . Influenza, Seasonal, Injecte, Preservative Fre 12/13/2013, 11/20/2014  . Influenza,inj,Quad  PF,6+ Mos 12/17/2015, 01/07/2019  . Influenza-Unspecified 12/14/2016  . Pneumococcal Conjugate-13 11/23/2018  . Pneumococcal Polysaccharide-23 08/27/2014  . Tdap 10/19/2014   We updated and reviewed the patient's past history in detail and it is documented below. Allergies: Patient is allergic to morphine and related, statins, percocet [oxycodone-acetaminophen], and vicodin [hydrocodone-acetaminophen]. Past Medical History Patient  has a past medical history of Arthritis, Hyperlipidemia, Spinal stenosis, Thyroid disease, and Type 2 diabetes mellitus with hyperlipidemia ([place]). Past Surgical History Patient  has a past surgical history that includes Abdominal hysterectomy; Carpal tunnel release (Right); Knee surgery (Left); Tonsillectomy and adenoidectomy; Spine surgery; Cervical fusion; and Lumbar laminectomy. Family History: Patient family history includes Cancer in her father, mother, and sister; Diabetes in her brother, father, and sister; Heart disease in her father; Hyperlipidemia in her father, mother, sister, and sister. Social History:  Patient  reports that she has never smoked. She has never used smokeless tobacco. She reports that she does not drink alcohol and does not use drugs.  Review of Systems: Constitutional: negative for fever or malaise Ophthalmic: negative for photophobia, double vision or loss of vision Cardiovascular: negative for chest pain, dyspnea on exertion, or new LE swelling Respiratory: negative for SOB or persistent cough Gastrointestinal: negative for abdominal pain, change in bowel habits or melena Genitourinary: negative for dysuria or gross hematuria, no abnormal uterine bleeding or disharge Musculoskeletal: negative for new gait disturbance or muscular weakness Integumentary: negative for new or persistent rashes, no breast lumps Neurological: negative for TIA or stroke symptoms Psychiatric: negative for SI or delusions Allergic/Immunologic: negative  for hives  Patient Care Team    Relationship Specialty Notifications Start End  Leamon Arnt, MD PCP - General Family Medicine  12/20/14   Ashok Pall, MD Consulting Physician Neurosurgery  02/17/17   Marygrace Drought, MD Consulting Physician Ophthalmology  05/17/17     Objective  Vitals: BP (!) 146/92   Pulse 92   Temp (!) 97.2 F (36.2 C) (Temporal)   Resp 15   Ht 5\' 6"  (1.676 m)   Wt 193 lb 12.8 oz (87.9 kg)   SpO2 97%   BMI 31.28 kg/m  General:  Well developed, well nourished, no acute distress  Psych:  Alert  and orientedx3,normal mood and affect HEENT:  Normocephalic, atraumatic, non-icteric sclera,  supple neck without adenopathy, mass or thyromegaly Cardiovascular:  Normal S1, S2, RRR without gallop, rub or murmur Respiratory:  Good breath sounds bilaterally, CTAB with normal respiratory effort Gastrointestinal: normal bowel sounds, soft, non-tender, no noted masses. No HSM MSK: no deformities, contusions. Joints are without erythema or swelling.  Skin:  Warm, no rashes or suspicious lesions noted Neurologic:    Mental status is normal. CN 2-11 are normal. Gross motor and sensory exams are normal. Normal gait. No tremor   Commons side effects, risks, benefits, and alternatives for medications and treatment plan prescribed today were discussed, and the patient expressed understanding of the given instructions. Patient is instructed to call or message via MyChart if he/she has any questions or concerns regarding our treatment plan. No barriers to understanding were identified. We discussed Red Flag symptoms and signs in detail. Patient expressed understanding regarding what to do in case of urgent or emergency type symptoms.   Medication list was reconciled, printed and provided to the patient in AVS. Patient instructions and summary information was reviewed with the patient as documented in the AVS. This note was prepared with assistance of Dragon voice recognition  software. Occasional wrong-word or sound-a-like substitutions may have occurred due to the inherent limitations of voice recognition software  This visit occurred during the SARS-CoV-2 public health emergency.  Safety protocols were in place, including screening questions prior to the visit, additional usage of staff PPE, and extensive cleaning of exam room while observing appropriate contact time as indicated for disinfecting solutions.

## 2019-10-18 NOTE — Patient Instructions (Addendum)
Please return in 6 months to recheck your diabetes and blood pressures.   I will release your lab results to you on your MyChart account with further instructions. Please reply with any questions.  Your diabetes has just about normalized at this point!  I'm so sorry about the loss of your father. May he rest in peace.   If you have any questions or concerns, please don't hesitate to send me a message via MyChart or call the office at [phone]. Thank you for visiting with [place] today! It's our pleasure caring for you.

## 2019-10-19 ENCOUNTER — Other Ambulatory Visit: Payer: Self-pay | Admitting: Family Medicine

## 2019-10-19 LAB — COMPREHENSIVE METABOLIC PANEL
AG Ratio: 1.4 (calc) (ref 1.0–2.5)
ALT: 24 U/L (ref 6–29)
AST: 19 U/L (ref 10–35)
Albumin: 4 g/dL (ref 3.6–5.1)
Alkaline phosphatase (APISO): 84 U/L (ref 37–153)
BUN: 18 mg/dL (ref 7–25)
CO2: 28 mmol/L (ref 20–32)
Calcium: 9.3 mg/dL (ref 8.6–10.4)
Chloride: 102 mmol/L (ref 98–110)
Creat: 0.92 mg/dL (ref 0.50–0.99)
Globulin: 2.9 g/dL (calc) (ref 1.9–3.7)
Glucose, Bld: 131 mg/dL — ABNORMAL HIGH (ref 65–99)
Potassium: 4.3 mmol/L (ref 3.5–5.3)
Sodium: 138 mmol/L (ref 135–146)
Total Bilirubin: 0.4 mg/dL (ref 0.2–1.2)
Total Protein: 6.9 g/dL (ref 6.1–8.1)

## 2019-10-19 LAB — CBC WITH DIFFERENTIAL/PLATELET
Absolute Monocytes: 654 cells/uL (ref 200–950)
Basophils Absolute: 61 cells/uL (ref 0–200)
Basophils Relative: 0.8 %
Eosinophils Absolute: 99 cells/uL (ref 15–500)
Eosinophils Relative: 1.3 %
HCT: 46.7 % — ABNORMAL HIGH (ref 35.0–45.0)
Hemoglobin: 15.8 g/dL — ABNORMAL HIGH (ref 11.7–15.5)
Lymphs Abs: 1968 cells/uL (ref 850–3900)
MCH: 32.5 pg (ref 27.0–33.0)
MCHC: 33.8 g/dL (ref 32.0–36.0)
MCV: 96.1 fL (ref 80.0–100.0)
MPV: 11.1 fL (ref 7.5–12.5)
Monocytes Relative: 8.6 %
Neutro Abs: 4818 cells/uL (ref 1500–7800)
Neutrophils Relative %: 63.4 %
Platelets: 306 10*3/uL (ref 140–400)
RBC: 4.86 10*6/uL (ref 3.80–5.10)
RDW: 11.7 % (ref 11.0–15.0)
Total Lymphocyte: 25.9 %
WBC: 7.6 10*3/uL (ref 3.8–10.8)

## 2019-10-19 LAB — LIPID PANEL
Cholesterol: 214 mg/dL — ABNORMAL HIGH (ref <200)
HDL: 79 mg/dL (ref >=50)
LDL Cholesterol (Calc): 115 mg/dL (calc) — ABNORMAL HIGH
Non-HDL Cholesterol (Calc): 135 mg/dL (calc) — ABNORMAL HIGH (ref <130)
Total CHOL/HDL Ratio: 2.7 (calc) (ref <5.0)
Triglycerides: 92 mg/dL (ref <150)

## 2019-10-19 LAB — TSH: TSH: 1.95 mIU/L (ref 0.40–4.50)

## 2019-10-19 LAB — MICROALBUMIN / CREATININE URINE RATIO
Creatinine, Urine: 93 mg/dL (ref 20–275)
Microalb, Ur: <0.2 mg/dL

## 2019-10-19 LAB — VITAMIN D 25 HYDROXY (VIT D DEFICIENCY, FRACTURES): Vit D, 25-Hydroxy: 45 ng/mL (ref 30–100)

## 2019-10-21 ENCOUNTER — Encounter: Payer: Self-pay | Admitting: Family Medicine

## 2019-11-09 ENCOUNTER — Telehealth: Payer: Self-pay | Admitting: Family Medicine

## 2019-11-09 ENCOUNTER — Encounter: Payer: Self-pay | Admitting: Family Medicine

## 2019-11-09 NOTE — Chronic Care Management (AMB) (Signed)
  Chronic Care Management   Note  11/09/2019 Name: Holly Riley MRN: [id number] DOB: [birth year]  Holly Riley is a 64 y.o. year old female who is a primary care patient of Leamon Arnt, MD. I reached out to Eda Keys by phone today in response to a referral sent by [name]'s PCP, Leamon Arnt, MD.   [name] was given information about Chronic Care Management services today including:  1. CCM service includes personalized support from designated clinical staff supervised by her physician, including individualized plan of care and coordination with other care providers 2. 24/7 contact phone numbers for assistance for urgent and routine care needs. 3. Service will only be billed when office clinical staff spend 20 minutes or more in a month to coordinate care. 4. Only one practitioner may furnish and bill the service in a calendar month. 5. The patient may stop CCM services at any time (effective at the end of the month) by phone call to the office staff.   Patient agreed to services and verbal consent obtained.   Follow up plan:   Lauretta Grill [employer] Scheduler

## 2019-11-12 ENCOUNTER — Other Ambulatory Visit: Payer: Self-pay

## 2019-11-12 MED ORDER — TRAZODONE HCL 100 MG PO TABS: 100.0000 mg | ORAL_TABLET | Freq: Every day | ORAL | 5 refills | Status: DC

## 2019-11-14 DIAGNOSIS — Z1231 Encounter for screening mammogram for malignant neoplasm of breast: Secondary | ICD-10-CM | POA: Diagnosis not present

## 2019-11-14 LAB — HM MAMMOGRAPHY

## 2019-11-16 ENCOUNTER — Other Ambulatory Visit: Payer: Self-pay | Admitting: Family Medicine

## 2019-11-29 ENCOUNTER — Encounter: Payer: Self-pay | Admitting: Family Medicine

## 2019-12-19 DIAGNOSIS — H43813 Vitreous degeneration, bilateral: Secondary | ICD-10-CM | POA: Diagnosis not present

## 2019-12-19 DIAGNOSIS — H2513 Age-related nuclear cataract, bilateral: Secondary | ICD-10-CM | POA: Diagnosis not present

## 2019-12-19 DIAGNOSIS — H524 Presbyopia: Secondary | ICD-10-CM | POA: Diagnosis not present

## 2019-12-19 DIAGNOSIS — H47323 Drusen of optic disc, bilateral: Secondary | ICD-10-CM | POA: Diagnosis not present

## 2019-12-19 LAB — HM DIABETES EYE EXAM

## 2020-01-28 ENCOUNTER — Telehealth: Payer: Self-pay

## 2020-01-28 NOTE — Progress Notes (Signed)
Chronic Care Management Pharmacy Assistant   Name: Holly Riley  MRN: [id number] DOB: 1955-11-21  Reason for Encounter: Medication Review/ Initial Visit  Holly Riley,  64 y.o. , female presents for their Initial CCM visit with the clinical pharmacist via telephone.  PCP : Leamon Arnt, MD  Allergies:   Allergies  Allergen Reactions  . Morphine And Related Other (See Comments)    hallucinations hallucinations  . Statins Other (See Comments)    myalgis  . Percocet [Oxycodone-Acetaminophen] Itching and Other (See Comments)    tachycardia  . Vicodin [Hydrocodone-Acetaminophen]     Medications: Outpatient Encounter Medications as of 01/28/2020  Medication Sig  . acetaminophen (TYLENOL) 500 MG tablet Take 500 mg by mouth every 6 (six) hours as needed.  . Cholecalciferol (VITAMIN D3) 50 MCG (2000 UT) capsule Take 2,000 Units by mouth daily.   . Cinnamon 500 MG TABS Take 500 mg by mouth daily.  Marland Kitchen estradiol (ESTRACE) 0.5 MG tablet TAKE 1 TABLET(0.5 MG) BY MOUTH DAILY  . ezetimibe (ZETIA) 10 MG tablet Take 1 tablet (10 mg total) by mouth daily.  . Flaxseed, Linseed, (FLAX SEED OIL PO) Take 1 tablet by mouth daily.  . fluticasone (FLONASE) 50 MCG/ACT nasal spray Place into the nose.  . Garlic 10 MG CAPS Take 10 mg by mouth daily.  Marland Kitchen levothyroxine (SYNTHROID) 75 MCG tablet TAKE 1 TABLET(75 MCG) BY MOUTH DAILY  . Magnesium 250 MG TABS Take 1 tablet by mouth daily.   . Omega-3 Fatty Acids (FISH OIL CONCENTRATE) 1000 MG CAPS Take 1,000 mg by mouth daily.  . Red Yeast Rice Extract 600 MG CAPS Take by mouth.  . traZODone (DESYREL) 100 MG tablet Take 1 tablet (100 mg total) by mouth at bedtime.  . TURMERIC PO Take by mouth.  . Zinc Sulfate (ZINC 15 PO) Take by mouth.   No facility-administered encounter medications on file as of 01/28/2020.    Current Diagnosis: Patient Active Problem List   Diagnosis Date Noted  . Osteoporosis screening 11/16/2018  . Statin intolerance  03/20/2017  . Vitamin D deficiency 03/20/2017  . Controlled type 2 diabetes mellitus without complication, without long-term current use of insulin ([place]) 02/17/2017  . Combined hyperlipidemia associated with type 2 diabetes mellitus ([place]) 02/17/2017  . Spinal stenosis   . 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 Insomnia 09/11/2013  . Post-menopause on HRT (hormone replacement therapy) 09/11/2013  . Hypothyroidism 03/26/2013  . Obesity (BMI 30.0-34.9) 01/22/2013  . Osteoarthritis 04/28/2011  . Allergic rhinitis 11/25/2009    Have you seen any other providers since your last visit?       Patient stated she has not seen any other providers since last visit.  Any changes in your medications or health?        Patient states there are no changes in medications or health.  Any side effects from any medications?         Patient states she does have side effects from her statin medications. Patient states she gets cramps and sometimes does not take the medication due to the side effects.  Do you have an symptoms or problems not managed by your medications?         Patient states she has no symptoms or problems not managed by medications.  Any concerns about your health right now?         Patient states she has no concerns about her health at this moment.  Has your provider  asked that you check blood pressure, blood sugar, or follow special diet at home?        Patient states she dopes not check blood pressure or blood sugar and states she tries to follow a diet at home.  Do you get any type of exercise on a regular basis?        Patient states she rides her bicycle .  Can you think of a goal you would like to reach for your health?           Patient states she can not think of a goal at this moment.  Do you have any problems getting your medications?          Patient states she does not have any problems getting her medications.  Is there anything that you would like to discuss during  the appointment?          Patient states not at this moment.  Please bring medications and supplements to appointment   Holly Riley ,[facility] Clinical Pharmacist Assistant [phone]   Follow-Up:  Pharmacist Review

## 2020-01-30 ENCOUNTER — Telehealth: Payer: Medicare HMO

## 2020-02-20 ENCOUNTER — Telehealth: Payer: Medicare HMO

## 2020-03-11 ENCOUNTER — Ambulatory Visit: Payer: Medicare HMO

## 2020-03-11 DIAGNOSIS — E1169 Type 2 diabetes mellitus with other specified complication: Secondary | ICD-10-CM

## 2020-03-11 DIAGNOSIS — E119 Type 2 diabetes mellitus without complications: Secondary | ICD-10-CM

## 2020-03-11 DIAGNOSIS — Z789 Other specified health status: Secondary | ICD-10-CM

## 2020-03-11 NOTE — Patient Instructions (Addendum)
[name],  Thank you for taking the time to review your medications with me today.  I have included our care plan/goals in the following pages. Please review and call me at [phone] with any questions!  Thanks! Ellin Mayhew, Pharm.D., BCGP Clinical Pharmacist [facility] [phone]  Goals Addressed            This Visit's Progress   . PharmD Care Plan       CARE PLAN ENTRY (see longitudinal plan of care for additional care plan information)  Current Barriers:  . Chronic Disease Management support, education, and care coordination needs related to Hyperlipidemia and Diabetes   Hyperlipidemia/statin intolerance  Lipid Panel     Component Value Date/Time   CHOL 214 (H) 10/18/2019 1014   TRIG 92 10/18/2019 1014   HDL 79 10/18/2019 1014   CHOLHDL 2.7 10/18/2019 1014   VLDL 18.2 10/10/2018 1028   LDLCALC 115 (H) 10/18/2019 1014   . Pharmacist Clinical Goal(s): o Over the next 180 days, patient will work with PharmD and providers to achieve LDL goal < 100 . Current regimen:  o Zetia 10 mg once daily . Interventions: o We discussed LDL cholesterol goals - above goal. o Reviewed tolerability/side effects. . Patient self care activities - Over the next 180 days, patient will: o Maintain diet/exercise  Diabetes Lab Results  Component Value Date/Time   HGBA1C 5.8 (A) 10/18/2019 09:43 AM   HGBA1C 5.9 (A) 05/16/2019 10:38 AM   HGBA1C 5.8 02/09/2018 08:14 AM   HGBA1C 6.4 05/17/2017 09:49 AM   HGBA1C 6.5 02/17/2017 10:48 AM   HGBA1C 5.8 10/28/2016 12:00 AM   . Pharmacist Clinical Goal(s): o Over the next 180 days, patient will work with PharmD and providers to maintain A1c goal <6.5% . Current regimen:  o Diet/exercise alone . Interventions: o We discussed a1c goals - currently at goal. Reviewed diet/exercise - Maintain a healthy weight and exercise regularly, as directed by your health care provider. Eat healthy foods, such  as: Lean proteins, complex carbohydrates, fresh fruits and vegetables, low-fat dairy products, healthy fats. . Patient self care activities - Over the next 180 days, patient will: o Check blood sugar only if advised, document, and provide at future appointments  Medication management . Pharmacist Clinical Goal(s): o Over the next 365 days, patient will work with PharmD and providers to maintain optimal medication adherence . Current pharmacy: [facility] . Interventions o Comprehensive medication review performed. o Continue current medication management strategy . Patient self care activities - Over the next 365 days, patient will: o Take medications as prescribed o Report any questions or concerns to PharmD and/or provider(s) Initial goal documentation.      [name] was given information about Chronic Care Management services today including:  1. CCM service includes personalized support from designated clinical staff supervised by her physician, including individualized plan of care and coordination with other care providers 2. 24/7 contact phone numbers for assistance for urgent and routine care needs. 3. Standard insurance, coinsurance, copays and deductibles apply for chronic care management only during months in which we provide at least 20 minutes of these services. Most insurances cover these services at 100%, however patients may be responsible for any copay, coinsurance and/or deductible if applicable. This service may help you avoid the need for more expensive face-to-face services. 4. Only one practitioner may furnish and bill the service in a calendar month. 5. The patient may  stop CCM services at any time (effective at the end of the month) by phone call to the office staff.  Patient agreed to services and verbal consent obtained.   The patient verbalized understanding of instructions provided today and agreed to receive a mailed copy of patient instruction and/or  educational materials. Telephone follow up appointment with pharmacy team member scheduled for: See next appointment with "Care Management Staff" under "What's Next" below.   Madelin Rear, Pharm.D., BCGP Clinical Pharmacist [facility] [phone]  High Cholesterol  High cholesterol is a condition in which the blood has high levels of a white, waxy substance similar to fat (cholesterol). The liver makes all the cholesterol that the body needs. The human body needs small amounts of cholesterol to help build cells. A person gets extra or excess cholesterol from the food that he or she eats. The blood carries cholesterol from the liver to the rest of the body. If you have high cholesterol, deposits (plaques) may build up on the walls of your arteries. Arteries are the blood vessels that carry blood away from your heart. These plaques make the arteries narrow and stiff. Cholesterol plaques increase your risk for heart attack and stroke. Work with your health care provider to keep your cholesterol levels in a healthy range. What increases the risk? The following factors may make you more likely to develop this condition:  Eating foods that are high in animal fat (saturated fat) or cholesterol.  Being overweight.  Not getting enough exercise.  A family history of high cholesterol (familial hypercholesterolemia).  Use of tobacco products.  Having diabetes. What are the signs or symptoms? There are no symptoms of this condition. How is this diagnosed? This condition may be diagnosed based on the results of a blood test.  If you are older than 67 years of age, your health care provider may check your cholesterol levels every 4-6 years.  You may be checked more often if you have high cholesterol or other risk factors for heart disease. The blood test for cholesterol measures:  "Bad" cholesterol, or LDL cholesterol. This is the main type of cholesterol that  causes heart disease. The desired level is less than 100 mg/dL.  "Good" cholesterol, or HDL cholesterol. HDL helps protect against heart disease by cleaning the arteries and carrying the LDL to the liver for processing. The desired level for HDL is 60 mg/dL or higher.  Triglycerides. These are fats that your body can store or burn for energy. The desired level is less than 150 mg/dL.  Total cholesterol. This measures the total amount of cholesterol in your blood and includes LDL, HDL, and triglycerides. The desired level is less than 200 mg/dL. How is this treated? This condition may be treated with:  Diet changes. You may be asked to eat foods that have more fiber and less saturated fats or added sugar.  Lifestyle changes. These may include regular exercise, maintaining a healthy weight, and quitting use of tobacco products.  Medicines. These are given when diet and lifestyle changes have not worked. You may be prescribed a statin medicine to help lower your cholesterol levels. Follow these instructions at home: Eating and drinking  Eat a healthy, balanced diet. This diet includes: ? Daily servings of a variety of fresh, frozen, or canned fruits and vegetables. ? Daily servings of whole grain foods that are rich in fiber. ? Foods that are low in saturated fats and trans fats. These include poultry  and fish without skin, lean cuts of meat, and low-fat dairy products. ? A variety of fish, especially oily fish that contain omega-3 fatty acids. Aim to eat fish at least 2 times a week.  Avoid foods and drinks that have added sugar.  Use healthy cooking methods, such as roasting, grilling, broiling, baking, poaching, steaming, and stir-frying. Do not fry your food except for stir-frying.   Lifestyle  Get regular exercise. Aim to exercise for a total of 150 minutes a week. Increase your activity level by doing activities such as gardening, walking, and taking the stairs.  Do not use any  products that contain nicotine or tobacco, such as cigarettes, e-cigarettes, and chewing tobacco. If you need help quitting, ask your health care provider.   General instructions  Take over-the-counter and prescription medicines only as told by your health care provider.  Keep all follow-up visits as told by your health care provider. This is important. Where to find more information  American Heart Association: www.heart.org  National Heart, Lung, and Blood Institute: https://wilson-eaton.com/ Contact a health care provider if:  You have trouble achieving or maintaining a healthy diet or weight.  You are starting an exercise program.  You are unable to stop smoking. Get help right away if:  You have chest pain.  You have trouble breathing.  You have any symptoms of a stroke. "BE FAST" is an easy way to remember the main warning signs of a stroke: ? B - Balance. Signs are dizziness, sudden trouble walking, or loss of balance. ? E - Eyes. Signs are trouble seeing or a sudden change in vision. ? F - Face. Signs are sudden weakness or numbness of the face, or the face or eyelid drooping on one side. ? A - Arms. Signs are weakness or numbness in an arm. This happens suddenly and usually on one side of the body. ? S - Speech. Signs are sudden trouble speaking, slurred speech, or trouble understanding what people say. ? T - Time. Time to call emergency services. Write down what time symptoms started.  You have other signs of a stroke, such as: ? A sudden, severe headache with no known cause. ? Nausea or vomiting. ? Seizure. These symptoms may represent a serious problem that is an emergency. Do not wait to see if the symptoms will go away. Get medical help right away. Call your local emergency services (911 in the [place] Do not drive yourself to the hospital. Summary  Cholesterol plaques increase your risk for heart attack and stroke. Work with your health care provider to keep your  cholesterol levels in a healthy range.  Eat a healthy, balanced diet, get regular exercise, and maintain a healthy weight.  Do not use any products that contain nicotine or tobacco, such as cigarettes, e-cigarettes, and chewing tobacco.  Get help right away if you have any symptoms of a stroke. This information is not intended to replace advice given to you by your health care provider. Make sure you discuss any questions you have with your health care provider. Document Revised: 01/15/2019 Document Reviewed: 01/15/2019 Elsevier Patient Education  2021 [employer].

## 2020-03-11 NOTE — Progress Notes (Signed)
Chronic Care Management Pharmacy Name: Holly Riley     MRN: [id number]     DOB: March 19, 1955  Chief Complaint/ HPI Holly Riley, 65 y.o., female, presents for their initial CCM visit with the clinical pharmacist via telephone due to COVID-19 pandemic.  PCP: Leamon Arnt, MD Encounter Diagnoses  Name Primary?  . Controlled type 2 diabetes mellitus without complication, without long-term current use of insulin ([place]) Yes  . Combined hyperlipidemia associated with type 2 diabetes mellitus ([place])   . Statin intolerance     Office Visits:  10/21/2019 (PCP): LDL improving on Zetia 10 mg, no changes.   Patient Active Problem List   Diagnosis Date Noted  . Osteoporosis screening 11/16/2018  . Statin intolerance 03/20/2017  . Vitamin D deficiency 03/20/2017  . Controlled type 2 diabetes mellitus without complication, without long-term current use of insulin ([place]) 02/17/2017  . Combined hyperlipidemia associated with type 2 diabetes mellitus ([place]) 02/17/2017  . Spinal stenosis   . 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 Insomnia 09/11/2013  . Post-menopause on HRT (hormone replacement therapy) 09/11/2013  . Hypothyroidism 03/26/2013  . Obesity (BMI 30.0-34.9) 01/22/2013  . Osteoarthritis 04/28/2011  . Allergic rhinitis 11/25/2009   Past Surgical History:  Procedure Laterality Date  . ABDOMINAL HYSTERECTOMY    . CARPAL TUNNEL RELEASE Right   . CERVICAL FUSION     [place]  . KNEE SURGERY Left   . LUMBAR LAMINECTOMY     x 2, L4, L5, [place]  . SPINE SURGERY    . TONSILLECTOMY AND ADENOIDECTOMY     Family History  Problem Relation Age of Onset  . Cancer Mother   . Hyperlipidemia Mother   . Cancer Father   . Diabetes Father   . Heart disease Father   . Hyperlipidemia Father   . Cancer Sister   . Diabetes Sister   . Hyperlipidemia Sister   . Diabetes Brother   . Hyperlipidemia Sister    Social History   Social History Narrative  . Not on file   Allergies  Allergen  Reactions  . Morphine And Related Other (See Comments)    hallucinations hallucinations  . Statins Other (See Comments)    myalgis  . Percocet [Oxycodone-Acetaminophen] Itching and Other (See Comments)    tachycardia  . Vicodin [Hydrocodone-Acetaminophen]    Outpatient Encounter Medications as of 03/11/2020  Medication Sig  . levothyroxine (SYNTHROID) 75 MCG tablet TAKE 1 TABLET(75 MCG) BY MOUTH DAILY  . acetaminophen (TYLENOL) 500 MG tablet Take 500 mg by mouth every 6 (six) hours as needed.  . Cholecalciferol (VITAMIN D3) 50 MCG (2000 UT) capsule Take 2,000 Units by mouth daily.   . Cinnamon 500 MG TABS Take 500 mg by mouth daily.  Marland Kitchen estradiol (ESTRACE) 0.5 MG tablet TAKE 1 TABLET(0.5 MG) BY MOUTH DAILY  . ezetimibe (ZETIA) 10 MG tablet Take 1 tablet (10 mg total) by mouth daily.  . Flaxseed, Linseed, (FLAX SEED OIL PO) Take 1 tablet by mouth daily.  . fluticasone (FLONASE) 50 MCG/ACT nasal spray Place into the nose.  . Garlic 10 MG CAPS Take 10 mg by mouth daily.  . Magnesium 250 MG TABS Take 1 tablet by mouth daily.   . Omega-3 Fatty Acids (FISH OIL CONCENTRATE) 1000 MG CAPS Take 1,000 mg by mouth daily.  . Red Yeast Rice Extract 600 MG CAPS Take by mouth. (Patient not taking: Reported on 03/11/2020)  . traZODone (DESYREL) 100 MG tablet Take 1 tablet (100 mg total) by  mouth at bedtime. (Patient not taking: Reported on 03/11/2020)  . TURMERIC PO Take by mouth.  . Zinc Sulfate (ZINC 15 PO) Take by mouth.   No facility-administered encounter medications on file as of 03/11/2020.   Patient Care Team    Relationship Specialty Notifications Start End  Leamon Arnt, MD PCP - General Family Medicine  12/20/14   Ashok Pall, MD Consulting Physician Neurosurgery  02/17/17   Marygrace Drought, MD Consulting Physician Ophthalmology  05/17/17   Holly Riley, [facility] Pharmacist Pharmacist  11/09/19    Comment: [phone]   Current Diagnosis/Assessment:  Diabetes   A1c goal < 6.5%  Lab  Results  Component Value Date/Time   HGBA1C 5.8 (A) 10/18/2019 09:43 AM   HGBA1C 5.9 (A) 05/16/2019 10:38 AM   HGBA1C 5.6 01/10/2019 09:51 AM   HGBA1C 6.5 (A) 10/10/2018 10:07 AM   HGBA1C 5.8 02/09/2018 08:14 AM   HGBA1C 6.0 (A) 08/11/2017 01:11 PM   HGBA1C 6.4 05/17/2017 09:49 AM   HGBA1C 6.5 02/17/2017 10:48 AM   HGBA1C 5.8 10/28/2016 12:00 AM   MICROALBUR <0.2 10/18/2019 10:14 AM   MICROALBUR <0.7 10/10/2018 10:28 AM   MICRALBCREAT NOTE 10/18/2019 10:14 AM   MICRALBCREAT 1.1 10/10/2018 10:28 AM   GFR 67.11 10/10/2018 10:28 AM   GFR 66.07 02/09/2018 08:22 AM   GFR 73.70 02/17/2017 10:48 AM    Previous medications: n/a. Recent FBG readings: n/a. Patient is currently at goal on: . No medications  We discussed a1c goals - currently at goal. Reviewed diet/exercise - Maintain a healthy weight and exercise regularly, as directed by your health care provider. Eat healthy foods, such as: Lean proteins, complex carbohydrates, fresh fruits and vegetables, low-fat dairy products, healthy fats.  Plan  Continue current medications.  Hyperlipidemia   LDL goal < 100  Lipid Panel     Component Value Date/Time   CHOL 214 (H) 10/18/2019 1014   TRIG 92 10/18/2019 1014   HDL 79 10/18/2019 1014   LDLCALC 115 (H) 10/18/2019 1014    Hepatic Function Latest Ref Rng & Units 10/18/2019 07/31/2019 10/10/2018  Total Protein 6.1 - 8.1 g/dL 6.9 - 6.3  Albumin 3.5 - 5.2 g/dL - - 3.7  AST 10 - 35 U/L _0 ALT 6 - 29 U/L _1 Alk Phosphatase 25 - 125 - 88 86  Total Bilirubin 0.2 - 1.2 mg/dL 0.4 - 0.4    The 10-year ASCVD risk score Mikey Bussing DC Jr., et al., 2013) is: 13.4%   Values used to calculate the score:     Age: 22 years     Sex: Female     Is Non-Hispanic African American: No     Diabetic: Yes     Tobacco smoker: No     Systolic Blood Pressure: 583 mmHg     Is BP treated: No     HDL Cholesterol: 79 mg/dL     Total Cholesterol: 214 mg/dL   Previous medications: statin  intolerance (lovastatin 5 mg, rosuvastatin 5 mg).  Diet: Chicken/fish, does not buy chips, has stopped baking cakes. Fruits, routine greens.  Exercise: Walks steps around the house for exercise, splitting logs. Has been busy cleaning out fathers house after passing August 2021.  Planning on doing 6-10 miles biking while in [facility], swimming during warmer months.  Denies intolerance. Patient is currently not at goal the following medications:  . Zetia 10 mg once daily   We discussed LDL cholesterol goals - above goal. Reviewed tolerability/side  effects.  Plan  Continue current medications.  Hypothyroidism   Lab Results  Component Value Date/Time   TSH 1.95 10/18/2019 10:14 AM   TSH 1.75 07/31/2019 12:00 AM   TSH 1.03 10/10/2018 10:28 AM   Feels there is some ongoing fatigue/weight gain despite eating right and exercising, requesting t3/t4 at upcoming appt.  Denies issues with taking consistently. TSH is currently within range on the following medications:  . Levothyroxine 75 mcg once daily  We discussed medication administration - successful consistent daily use.  Plan  Continue current medications. Consider adding t3/t4 to upcoming PCP appt 04/04/2020 per pt request.  Bone health   Last DEXA Scan: 10/2018 - normal.  Lab Results  Component Value Date   VD25OH 45 10/18/2019   VD25OH 34.35 02/09/2018    Consistent weight bearing. Current medications: . Calcium/Vitamin D3 supplementation  Plan  Continue current medications. DEXA 2025.  Insomnia   Using trazodone only as needed, some vivid dreams.  Patient is currently on the following medications:  . Trazodone 100 mg once daily at bedtime  Plan  Continue current medications.  Medication Management / Care Coordination   Receives prescription medications from:  [facility] [phone] [place], [place] - [place] [address] [place] [address] [place]  [address] Phone: [phone] Fax: [phone]   Denies any issues with current medication management.   Plan  Continue current medication management strategy. ___________________________ SDOH (Social Determinants of Health) assessments performed: Yes.  Future Appointments  Date Time Provider [place]  04/04/2020  8:30 AM Leamon Arnt, MD [facility]  10/21/2020  9:00 AM Leamon Arnt, MD [facility]   Goals Addressed            This Visit's Progress   . PharmD Care Plan       CARE PLAN ENTRY (see longitudinal plan of care for additional care plan information)  Current Barriers:  . Chronic Disease Management support, education, and care coordination needs related to Hyperlipidemia and Diabetes   Hyperlipidemia/statin intolerance  Lipid Panel     Component Value Date/Time   CHOL 214 (H) 10/18/2019 1014   TRIG 92 10/18/2019 1014   HDL 79 10/18/2019 1014   CHOLHDL 2.7 10/18/2019 1014   VLDL 18.2 10/10/2018 1028   LDLCALC 115 (H) 10/18/2019 1014   . Pharmacist Clinical Goal(s): o Over the next 180 days, patient will work with PharmD and providers to achieve LDL goal < 100 . Current regimen:  o Zetia 10 mg once daily . Interventions: o We discussed LDL cholesterol goals - above goal. o Reviewed tolerability/side effects. . Patient self care activities - Over the next 180 days, patient will: o Maintain diet/exercise  Diabetes Lab Results  Component Value Date/Time   HGBA1C 5.8 (A) 10/18/2019 09:43 AM   HGBA1C 5.9 (A) 05/16/2019 10:38 AM   HGBA1C 5.8 02/09/2018 08:14 AM   HGBA1C 6.4 05/17/2017 09:49 AM   HGBA1C 6.5 02/17/2017 10:48 AM   HGBA1C 5.8 10/28/2016 12:00 AM   . Pharmacist Clinical Goal(s): o Over the next 180 days, patient will work with PharmD and providers to maintain A1c goal <6.5% . Current regimen:  o Diet/exercise alone . Interventions: o We discussed a1c goals - currently at goal. Reviewed diet/exercise - Maintain a healthy  weight and exercise regularly, as directed by your health care provider. Eat healthy foods, such as: Lean proteins, complex carbohydrates, fresh fruits and vegetables, low-fat dairy products, healthy fats. Marland Kitchen  Patient self care activities - Over the next 180 days, patient will: o Check blood sugar only if advised, document, and provide at future appointments  Medication management . Pharmacist Clinical Goal(s): o Over the next 365 days, patient will work with PharmD and providers to maintain optimal medication adherence . Current pharmacy: [facility] . Interventions o Comprehensive medication review performed. o Continue current medication management strategy . Patient self care activities - Over the next 365 days, patient will: o Take medications as prescribed o Report any questions or concerns to PharmD and/or provider(s) Initial goal documentation.      Visit follow-up:  . CPA follow-up: n/a. Marland Kitchen RPH follow-up: 9 month f/u telephone.  Holly Riley, Pharm.D., BCGP Clinical Pharmacist [facility] [phone]

## 2020-04-04 ENCOUNTER — Ambulatory Visit (INDEPENDENT_AMBULATORY_CARE_PROVIDER_SITE_OTHER): Payer: Medicare HMO | Admitting: Family Medicine

## 2020-04-04 ENCOUNTER — Other Ambulatory Visit: Payer: Self-pay

## 2020-04-04 ENCOUNTER — Encounter: Payer: Self-pay | Admitting: Family Medicine

## 2020-04-04 VITALS — BP 120/70 | HR 78 | Ht 66.0 in | Wt 210.0 lb

## 2020-04-04 DIAGNOSIS — R5383 Other fatigue: Secondary | ICD-10-CM | POA: Diagnosis not present

## 2020-04-04 DIAGNOSIS — Z79890 Hormone replacement therapy (postmenopausal): Secondary | ICD-10-CM | POA: Diagnosis not present

## 2020-04-04 DIAGNOSIS — Z789 Other specified health status: Secondary | ICD-10-CM | POA: Diagnosis not present

## 2020-04-04 DIAGNOSIS — E039 Hypothyroidism, unspecified: Secondary | ICD-10-CM

## 2020-04-04 DIAGNOSIS — E119 Type 2 diabetes mellitus without complications: Secondary | ICD-10-CM | POA: Diagnosis not present

## 2020-04-04 DIAGNOSIS — E1169 Type 2 diabetes mellitus with other specified complication: Secondary | ICD-10-CM | POA: Diagnosis not present

## 2020-04-04 DIAGNOSIS — Z7185 Encounter for immunization safety counseling: Secondary | ICD-10-CM | POA: Diagnosis not present

## 2020-04-04 DIAGNOSIS — E782 Mixed hyperlipidemia: Secondary | ICD-10-CM | POA: Diagnosis not present

## 2020-04-04 LAB — B12 AND FOLATE PANEL
Folate: >23.6 ng/mL (ref >5.9)
Vitamin B-12: 483 pg/mL (ref 211–911)

## 2020-04-04 LAB — CBC WITH DIFFERENTIAL/PLATELET
Basophils Absolute: 0 10*3/uL (ref 0.0–0.1)
Basophils Relative: 0.6 % (ref 0.0–3.0)
Eosinophils Absolute: 0.2 10*3/uL (ref 0.0–0.7)
Eosinophils Relative: 2.6 % (ref 0.0–5.0)
HCT: 43.1 % (ref 36.0–46.0)
Hemoglobin: 14.8 g/dL (ref 12.0–15.0)
Lymphocytes Relative: 31 % (ref 12.0–46.0)
Lymphs Abs: 2.1 10*3/uL (ref 0.7–4.0)
MCHC: 34.2 g/dL (ref 30.0–36.0)
MCV: 95.5 fl (ref 78.0–100.0)
Monocytes Absolute: 0.6 10*3/uL (ref 0.1–1.0)
Monocytes Relative: 8.9 % (ref 3.0–12.0)
Neutro Abs: 3.8 10*3/uL (ref 1.4–7.7)
Neutrophils Relative %: 56.9 % (ref 43.0–77.0)
Platelets: 237 10*3/uL (ref 150.0–400.0)
RBC: 4.52 Mil/uL (ref 3.87–5.11)
RDW: 12.8 % (ref 11.5–15.5)
WBC: 6.8 10*3/uL (ref 4.0–10.5)

## 2020-04-04 LAB — T4, FREE: Free T4: 1.17 ng/dL (ref 0.60–1.60)

## 2020-04-04 LAB — HEMOGLOBIN A1C: Hgb A1c MFr Bld: 7.1 % — ABNORMAL HIGH (ref 4.6–6.5)

## 2020-04-04 LAB — TSH: TSH: 2.5 u[IU]/mL (ref 0.35–4.50)

## 2020-04-04 NOTE — Patient Instructions (Signed)
Please return in 6 months for your annual complete physical; please come fasting.   I will release your lab results to you on your MyChart account with further instructions. Please reply with any questions.    If you have any questions or concerns, please don't hesitate to send me a message via MyChart or call the office at [phone]. Thank you for visiting with us today! It's our pleasure caring for you.  

## 2020-04-04 NOTE — Progress Notes (Signed)
Subjective  CC:  Chief Complaint  Patient presents with  . Follow-up    Blood pressure recheck.  Pt state she is concerned about her thyroid, she said she state tired, cold.    . Diabetes  . Hypothyroidism  . Hypertension    HPI: Holly Riley is a 65 y.o. female who presents to the office today for follow up of diabetes and problems listed above in the chief complaint.   Diabetes follow up: Her diabetic control is reported as Unchanged.  She is diet controlled.  However she has gained weight.  She reports eating has been a bit different due to the stress of having to travel to [place] every weekend to work on her father's estate.  She keeps busy but has not been able to do steady exercise.  Eating out often.  She denies symptoms of hypoglycemia. She denies exertional CP or SOB or symptomatic hypoglycemia. She denies foot sores or paresthesias.   Fatigue: She feels more tired than usual.  She feels her sleep is doing okay, she is no longer taking trazodone.  She worries that her thyroid levels may be off.  She does take her medication daily and empty stomach.  She is compliant.  She reports that she is cold all the time which is unusual for her.  No lower extremity edema, shortness of breath.  She remains on HRT feels this is stable.  No hot flushes.  Hyperlipidemia: She is on Zetia because she is intolerant to statins.  She is tolerating this well.  Health maintenance: She remains on vaccinated against COVID.  Has many fears regarding the vaccine.  She stays very isolated to avoid contracting the illness.  Wt Readings from Last 3 Encounters:  04/04/20 210 lb (95.3 kg)  10/18/19 193 lb 12.8 oz (87.9 kg)  05/16/19 199 lb 3.2 oz (90.4 kg)    BP Readings from Last 3 Encounters:  04/04/20 120/70  10/18/19 (!) 146/92  08/07/19 (!) 167/89    Assessment  1. Controlled type 2 diabetes mellitus without complication, without long-term current use of insulin ([place])   2. Acquired  hypothyroidism   3. Post-menopause on HRT (hormone replacement therapy)   4. Combined hyperlipidemia associated with type 2 diabetes mellitus ([facility])   5. Statin intolerance   6. Fatigue, unspecified type   7. Vaccine counseling      Plan   Diabetes diet-controlled: Recheck levels today.  No complications.  Hypothyroidism with fatigue: Check lab work to rule out other causes.  Ensure control is good.  She is compliant with medications.  Suspect fatigue is more related to stress of dealing with the passing of her father and dealing with his estate.  She is going to [place] for the next month.  Hopefully breast and that her eating will help her lose weight and feel better.  She denies insomnia.  Hyperlipidemia and Zetia.  Counseled regarding benefits and indications for vaccine against COVID.  Follow up: 6 months for complete physical. Orders Placed This Encounter  Procedures  . Hemoglobin A1c  . T3  . T4, free  . TSH  . CBC with Differential/Platelet  . Iron, TIBC and Ferritin Panel  . B12 and Folate Panel   No orders of the defined types were placed in this encounter.     Immunization History  Administered Date(s) Administered  . DTaP 04/27/2006  . Hepatitis B, ped/adol 04/28/1991  . Influenza Split 12/26/2007  . Influenza, Quadrivalent, Recombinant, Inj, Pf 01/22/2013  .  Influenza, Seasonal, Injecte, Preservative Fre 12/13/2013, 11/20/2014  . Influenza,inj,Quad PF,6+ Mos 12/17/2015, 01/07/2019  . Influenza-Unspecified 12/14/2016, 02/08/2020  . Pneumococcal Conjugate-13 11/23/2018  . Pneumococcal Polysaccharide-23 08/27/2014  . Tdap 10/19/2014    Diabetes Related Lab Review: Lab Results  Component Value Date   HGBA1C 5.8 (A) 10/18/2019   HGBA1C 5.9 (A) 05/16/2019   HGBA1C 5.6 01/10/2019    Lab Results  Component Value Date   MICROALBUR <0.2 10/18/2019   Lab Results  Component Value Date   CREATININE 0.92 10/18/2019   BUN 18 10/18/2019   NA 138 10/18/2019    K 4.3 10/18/2019   CL 102 10/18/2019   CO2 28 10/18/2019   Lab Results  Component Value Date   CHOL 214 (H) 10/18/2019   CHOL 228 (H) 10/10/2018   CHOL 237 (H) 02/09/2018   Lab Results  Component Value Date   HDL 79 10/18/2019   HDL 73.10 10/10/2018   HDL 69.20 02/09/2018   Lab Results  Component Value Date   LDLCALC 115 (H) 10/18/2019   LDLCALC 137 (H) 10/10/2018   LDLCALC 146 (H) 02/09/2018   Lab Results  Component Value Date   TRIG 92 10/18/2019   TRIG 91.0 10/10/2018   TRIG 107.0 02/09/2018   Lab Results  Component Value Date   CHOLHDL 2.7 10/18/2019   CHOLHDL 3 10/10/2018   CHOLHDL 3 02/09/2018   No results found for: LDLDIRECT The 10-year ASCVD risk score Mikey Bussing DC Jr., et al., 2013) is: 9.4%   Values used to calculate the score:     Age: 49 years     Sex: Female     Is Non-Hispanic African American: No     Diabetic: Yes     Tobacco smoker: No     Systolic Blood Pressure: 123456 mmHg     Is BP treated: No     HDL Cholesterol: 79 mg/dL     Total Cholesterol: 214 mg/dL I have reviewed the PMH, Fam and Soc history. Patient Active Problem List   Diagnosis Date Noted  . Statin intolerance 03/20/2017    Priority: High    Myalgias, simvistatin and crestor   . Controlled type 2 diabetes mellitus without complication, without long-term current use of insulin ([place]) 02/17/2017    Priority: High  . Combined hyperlipidemia associated with type 2 diabetes mellitus ([place]) 02/17/2017    Priority: High  . Spinal stenosis     Priority: High    NS - early, MRI   . Post-menopause on HRT (hormone replacement therapy) 09/11/2013    Priority: High  . Hypothyroidism 03/26/2013    Priority: High  . Obesity (BMI 30.0-34.9) 01/22/2013    Priority: High  . 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 Insomnia 09/11/2013    Priority: Medium  . Osteoarthritis 04/28/2011    Priority: Medium  . Vitamin D deficiency 03/20/2017    Priority: Low  . Allergic  rhinitis 11/25/2009    Priority: Low  . Osteoporosis screening 11/16/2018    Normal bone density 2020; recheck 2025     Social History: Patient  reports that she has never smoked. She has never used smokeless tobacco. She reports that she does not drink alcohol and does not use drugs.  Review of Systems: Ophthalmic: negative for eye pain, loss of vision or double vision Cardiovascular: negative for chest pain Respiratory: negative for SOB or persistent cough Gastrointestinal: negative for abdominal pain Genitourinary: negative for dysuria or gross hematuria MSK: negative  for foot lesions Neurologic: negative for weakness or gait disturbance  Objective  Vitals: BP 120/70   Pulse 78   Ht 5\' 6"  (1.676 m)   Wt 210 lb (95.3 kg)   SpO2 97%   BMI 33.89 kg/m  General: well appearing, no acute distress  Psych:  Alert and oriented, normal mood and affect HEENT:  Normocephalic, atraumatic, moist mucous membranes, supple neck  Cardiovascular:  Nl S1 and S2, RRR without murmur, gallop or rub. no edema Respiratory:  Good breath sounds bilaterally, CTAB with normal effort, no rales Skin:  Warm, no rashes Neurologic:   Mental status is normal. normal gait, no tremor     Diabetic education: ongoing education regarding chronic disease management for diabetes was given today. We continue to reinforce the ABC's of diabetic management: A1c (<7 or 8 dependent upon patient), tight blood pressure control, and cholesterol management with goal LDL < 100 minimally. We discuss diet strategies, exercise recommendations, medication options and possible side effects. At each visit, we review recommended immunizations and preventive care recommendations for diabetics and stress that good diabetic control can prevent other problems. See below for this patient's data.    Commons side effects, risks, benefits, and alternatives for medications and treatment plan prescribed today were discussed, and the patient  expressed understanding of the given instructions. Patient is instructed to call or message via MyChart if he/she has any questions or concerns regarding our treatment plan. No barriers to understanding were identified. We discussed Red Flag symptoms and signs in detail. Patient expressed understanding regarding what to do in case of urgent or emergency type symptoms.   Medication list was reconciled, printed and provided to the patient in AVS. Patient instructions and summary information was reviewed with the patient as documented in the AVS. This note was prepared with assistance of Dragon voice recognition software. Occasional wrong-word or sound-a-like substitutions may have occurred due to the inherent limitations of voice recognition software  This visit occurred during the SARS-CoV-2 public health emergency.  Safety protocols were in place, including screening questions prior to the visit, additional usage of staff PPE, and extensive cleaning of exam room while observing appropriate contact time as indicated for disinfecting solutions.

## 2020-04-05 LAB — IRON,TIBC AND FERRITIN PANEL
%SAT: 37 % (calc) (ref 16–45)
Ferritin: 31 ng/mL (ref 16–288)
Iron: 119 ug/dL (ref 45–160)
TIBC: 325 mcg/dL (calc) (ref 250–450)

## 2020-04-05 LAB — T3: T3, Total: 123 ng/dL (ref 76–181)

## 2020-05-08 ENCOUNTER — Other Ambulatory Visit: Payer: Self-pay | Admitting: Family Medicine

## 2020-05-23 ENCOUNTER — Ambulatory Visit (INDEPENDENT_AMBULATORY_CARE_PROVIDER_SITE_OTHER): Payer: Medicare HMO

## 2020-05-23 DIAGNOSIS — Z0000 Encounter for general adult medical examination without abnormal findings: Secondary | ICD-10-CM

## 2020-05-23 NOTE — Progress Notes (Signed)
Virtual Visit via Telephone Note  I connected with  Holly Riley on 05/23/20 at  8:45 AM EDT by telephone and verified that I am speaking with the correct person using two identifiers.  Medicare Annual Wellness visit completed telephonically due to Covid-19 pandemic.   Persons participating in this call: This Health Coach and this patient.   Location: Patient: Home Provider: Office   I discussed the limitations, risks, security and privacy concerns of performing an evaluation and management service by telephone and the availability of in person appointments. The patient expressed understanding and agreed to proceed.  Unable to perform video visit due to video visit attempted and failed and/or patient does not have video capability.   Some vital signs may be absent or patient reported.   Willette Brace, LPN    Subjective:   Holly Riley is a 65 y.o. female who presents for an Initial Medicare Annual Wellness Visit.  Review of Systems     Cardiac Risk Factors include: advanced age (>28men, >36 women);diabetes mellitus;dyslipidemia;obesity (BMI >30kg/m2)     Objective:    There were no vitals filed for this visit. There is no height or weight on file to calculate BMI.  Advanced Directives 05/23/2020  Does Patient Have a Medical Advance Directive? No  Would patient like information on creating a medical advance directive? No - Patient declined    Current Medications (verified) Outpatient Encounter Medications as of 05/23/2020  Medication Sig  . acetaminophen (TYLENOL) 500 MG tablet Take 500 mg by mouth every 6 (six) hours as needed.  Jolyne Loa Grape-Goldenseal (BERBERINE COMPLEX PO)   . Cholecalciferol (VITAMIN D3) 50 MCG (2000 UT) capsule Take 2,000 Units by mouth daily.   . Coenzyme Q10 (CO Q 10 PO)   . ezetimibe (ZETIA) 10 MG tablet TAKE 1 TABLET(10 MG) BY MOUTH DAILY  . Flaxseed, Linseed, (FLAX SEED OIL PO) Take 1 tablet by mouth daily.  . Garlic 10 MG  CAPS Take 10 mg by mouth daily.  Marland Kitchen levothyroxine (SYNTHROID) 75 MCG tablet TAKE 1 TABLET(75 MCG) BY MOUTH DAILY  . loratadine (CLARITIN) 10 MG tablet Take by mouth.  . Magnesium 250 MG TABS Take 1 tablet by mouth daily.   . Omega-3 Fatty Acids (FISH OIL CONCENTRATE) 1000 MG CAPS Take 1,000 mg by mouth daily.  . Red Yeast Rice 600 MG TABS   . Zinc Sulfate (ZINC 15 PO) Take by mouth.  Marland Kitchen FLUCELVAX QUADRIVALENT 0.5 ML injection   . [DISCONTINUED] estradiol (ESTRACE) 0.5 MG tablet TAKE 1 TABLET(0.5 MG) BY MOUTH DAILY   No facility-administered encounter medications on file as of 05/23/2020.    Allergies (verified) Morphine and related, Statins, Dilaudid [hydromorphone], Percocet [oxycodone-acetaminophen], and Vicodin [hydrocodone-acetaminophen]   History: Past Medical History:  Diagnosis Date  . Arthritis   . Hyperlipidemia   . Spinal stenosis    NS - early, MRI  . Thyroid disease   . Type 2 diabetes mellitus with hyperlipidemia [facility])    Past Surgical History:  Procedure Laterality Date  . ABDOMINAL HYSTERECTOMY    . CARPAL TUNNEL RELEASE Right   . CERVICAL FUSION     [place]  . KNEE SURGERY Left   . LUMBAR LAMINECTOMY     x 2, L4, L5, [place]  . SPINE SURGERY    . TONSILLECTOMY AND ADENOIDECTOMY     Family History  Problem Relation Age of Onset  . Cancer Mother   . Hyperlipidemia Mother   . Cancer Father   .  Diabetes Father   . Heart disease Father   . Hyperlipidemia Father   . Cancer Sister   . Diabetes Sister   . Hyperlipidemia Sister   . Diabetes Brother   . Hyperlipidemia Sister    Social History   Socioeconomic History  . Marital status: Married    Spouse name: Not on file  . Number of children: Not on file  . Years of education: Not on file  . Highest education level: Not on file  Occupational History  . 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 Smoking status: Never Smoker  . Smokeless tobacco: Never Used  Vaping  Use  . Vaping Use: Never used  Substance and Sexual Activity  . Alcohol use: No  . Drug use: No  . Sexual activity: Never  Other Topics Concern  . Not on file  Social History Narrative  . Not on file   Social Determinants of Health   Financial Resource Strain: Low Risk   . Difficulty of Paying Living Expenses: Not hard at all  Food Insecurity: No Food Insecurity  . Worried About Charity fundraiser in the Last Year: Never true  . Ran Out of Food in the Last Year: Never true  Transportation Needs: No Transportation Needs  . Lack of Transportation (Medical): No  . Lack of Transportation (Non-Medical): No  Physical Activity: Sufficiently Active  . Days of Exercise per Week: 5 days  . Minutes of Exercise per Session: 30 min  Stress: No Stress Concern Present  . Feeling of Stress : Not at all  Social Connections: Moderately Isolated  . Frequency of Communication with Friends and Family: More than three times a week  . Frequency of Social Gatherings with Friends and Family: More than three times a week  . Attends Religious Services: Never  . Active Member of Clubs or Organizations: No  . Attends Archivist Meetings: Never  . Marital Status: Married    Tobacco Counseling Counseling given: Not Answered   Clinical Intake:  Pre-visit preparation completed: Yes  Pain : No/denies pain     BMI - recorded: 33.91 Nutritional Status: BMI > 30  Obese Nutritional Risks: None Diabetes: Yes CBG done?: No Did pt. bring in CBG monitor from home?: No  How often do you need to have someone help you when you read instructions, pamphlets, or other written materials from your doctor or pharmacy?: 1 - Never  Diabetic?Nutrition Risk Assessment:  Has the patient had any N/V/D within the last 2 months?  No  Does the patient have any non-healing wounds?  No  Has the patient had any unintentional weight loss or weight gain?  No   Diabetes:  Is the patient diabetic?  Yes  If  diabetic, was a CBG obtained today?  No  Did the patient bring in their glucometer from home?  No  How often do you monitor your CBG's? N/A.   Financial Strains and Diabetes Management:  Are you having any financial strains with the device, your supplies or your medication? No .  Does the patient want to be seen by Chronic Care Management for management of their diabetes?  No  Would the patient like to be referred to a Nutritionist or for Diabetic Management?  No   Diabetic Exams:  Diabetic Eye Exam: Completed 12/19/19 Diabetic Foot Exam: Completed 10/18/19   Interpreter Needed?: No  Information entered by :: Charlott Rakes, LPN  Activities of Daily Living In your present state of health, do you have any difficulty performing the following activities: 05/23/2020 10/18/2019  Hearing? N N  Vision? N N  Difficulty concentrating or making decisions? N N  Walking or climbing stairs? N N  Dressing or bathing? N N  Doing errands, shopping? N N  Preparing Food and eating ? N -  Using the Toilet? N -  In the past six months, have you accidently leaked urine? N -  Do you have problems with loss of bowel control? N -  Managing your Medications? N -  Managing your Finances? N -  Housekeeping or managing your Housekeeping? N -  Some recent data might be hidden    Patient Care Team: Leamon Arnt, MD as PCP - General (Family Medicine) Ashok Pall, MD as Consulting Physician (Neurosurgery) Marygrace Drought, MD as Consulting Physician (Ophthalmology) Madelin Rear, [facility] as Pharmacist (Pharmacist)  Indicate any recent Medical Services you may have received from other than [facility] providers in the past year (date may be approximate).     Assessment:   This is a routine wellness examination for [place].  Hearing/Vision screen  Hearing Screening   125Hz  250Hz  500Hz  1000Hz  2000Hz  3000Hz  4000Hz  6000Hz  8000Hz   Right ear:           Left ear:           Comments: Pt denies any hearing    Vision Screening Comments: Pt follows up with Dr Satira Sark for annual eye exams  Dietary issues and exercise activities discussed: Current Exercise Habits: Home exercise routine, Type of exercise: walking, Time (Minutes): 30, Frequency (Times/Week): 5, Weekly Exercise (Minutes/Week): 150  Goals    . Patient Stated     Get A1C down     . PharmD Care Plan     CARE PLAN ENTRY (see longitudinal plan of care for additional care plan information)  Current Barriers:  . Chronic Disease Management support, education, and care coordination needs related to Hyperlipidemia and Diabetes   Hyperlipidemia/statin intolerance  Lipid Panel     Component Value Date/Time   CHOL 214 (H) 10/18/2019 1014   TRIG 92 10/18/2019 1014   HDL 79 10/18/2019 1014   CHOLHDL 2.7 10/18/2019 1014   VLDL 18.2 10/10/2018 1028   LDLCALC 115 (H) 10/18/2019 1014   . Pharmacist Clinical Goal(s): o Over the next 180 days, patient will work with PharmD and providers to achieve LDL goal < 100 . Current regimen:  o Zetia 10 mg once daily . Interventions: o We discussed LDL cholesterol goals - above goal. o Reviewed tolerability/side effects. . Patient self care activities - Over the next 180 days, patient will: o Maintain diet/exercise  Diabetes Lab Results  Component Value Date/Time   HGBA1C 5.8 (A) 10/18/2019 09:43 AM   HGBA1C 5.9 (A) 05/16/2019 10:38 AM   HGBA1C 5.8 02/09/2018 08:14 AM   HGBA1C 6.4 05/17/2017 09:49 AM   HGBA1C 6.5 02/17/2017 10:48 AM   HGBA1C 5.8 10/28/2016 12:00 AM   . Pharmacist Clinical Goal(s): o Over the next 180 days, patient will work with PharmD and providers to maintain A1c goal <6.5% . Current regimen:  o Diet/exercise alone . Interventions: o We discussed a1c goals - currently at goal. Reviewed diet/exercise - Maintain a healthy weight and exercise regularly, as directed by your health care provider. Eat healthy foods, such as: Lean proteins, complex carbohydrates, fresh fruits  and vegetables, low-fat dairy products, healthy fats. . Patient self care activities - Over  the next 180 days, patient will: o Check blood sugar only if advised, document, and provide at future appointments  Medication management . Pharmacist Clinical Goal(s): o Over the next 365 days, patient will work with PharmD and providers to maintain optimal medication adherence . Current pharmacy: [facility] . Interventions o Comprehensive medication review performed. o Continue current medication management strategy . Patient self care activities - Over the next 365 days, patient will: o Take medications as prescribed o Report any questions or concerns to PharmD and/or provider(s) Initial goal documentation.      Depression Screen PHQ 2/9 Scores 05/23/2020 10/18/2019 10/10/2018 02/17/2017 11/18/2016 10/21/2016 10/13/2016  PHQ - 2 Score 0 0 0 0 0 0 0    Fall Risk Fall Risk  05/23/2020 10/10/2018 11/18/2016 10/21/2016 10/13/2016  Falls in the past year? 1 0 No No No  Number falls in past yr: 1 0 - - -  Injury with Fall? 1 0 - - -  [place] - - -  Risk for fall due to : Impaired vision - - - -  Follow up Falls prevention discussed Falls evaluation completed - - -    FALL RISK PREVENTION PERTAINING TO THE HOME:  Any stairs in or around the home? Yes  If so, are there any without handrails? No  Home free of loose throw rugs in walkways, pet beds, electrical cords, etc? Yes  Adequate lighting in your home to reduce risk of falls? Yes   ASSISTIVE DEVICES UTILIZED TO PREVENT FALLS:  Life alert? No  Use of a cane, walker or w/c? No  Grab bars in the bathroom? Yes  Shower chair or bench in shower? No  Elevated toilet seat or a handicapped toilet? No   TIMED UP AND GO:  Was the test performed? No     Cognitive Function:     6CIT Screen 05/23/2020  What Year? 0 points  What month? 0 points  Count back from 20 0 points  Months in reverse 0 points  Repeat phrase  0 points    Immunizations Immunization History  Administered Date(s) Administered  . DTaP 04/27/2006  . Hepatitis B, ped/adol 04/28/1991  . Influenza Split 12/26/2007  . Influenza, Quadrivalent, Recombinant, Inj, Pf 01/22/2013  . Influenza, Seasonal, Injecte, Preservative Fre 12/13/2013, 11/20/2014  . Influenza,inj,Quad PF,6+ Mos 12/17/2015, 01/07/2019  . Influenza-Unspecified 12/14/2016, 02/08/2020  . Pneumococcal Conjugate-13 11/23/2018  . Pneumococcal Polysaccharide-23 08/27/2014  . Tdap 10/19/2014    TDAP status: Up to date   Flu Vaccine status: Up to date  Pneumococcal vaccine: Due and discussed  Covid-19 vaccine status: Declined, Education has been provided regarding the importance of this vaccine but patient still declined. Advised may receive this vaccine at local pharmacy or Health Dept.or vaccine clinic. Aware to provide a copy of the vaccination record if obtained from local pharmacy or Health Dept. Verbalized acceptance and understanding.  Qualifies for Shingles Vaccine? Yes   Zostavax completed No   Shingrix Completed?: No.    Education has been provided regarding the importance of this vaccine. Patient has been advised to call insurance company to determine out of pocket expense if they have not yet received this vaccine. Advised may also receive vaccine at local pharmacy or Health Dept. Verbalized acceptance and understanding.  Screening Tests Health Maintenance  Topic Date Due  . PNA vac Low Risk Adult (2 of 2 - PPSV23) 11/23/2019  . COVID-19 Vaccine (1) 06/08/2020 (Originally 09/15/1960)  . HEMOGLOBIN A1C  10/02/2020  . FOOT EXAM  10/17/2020  . URINE MICROALBUMIN  10/17/2020  . MAMMOGRAM  11/13/2020  . OPHTHALMOLOGY EXAM  12/18/2020  . DEXA SCAN  11/10/2023  . TETANUS/TDAP  10/18/2024  . COLONOSCOPY (Pts 45-42yrs Insurance coverage will need to be confirmed)  09/27/2029  . INFLUENZA VACCINE  Completed  . Hepatitis C Screening  Completed  . HPV VACCINES   Aged Out    Health Maintenance  Health Maintenance Due  Topic Date Due  . PNA vac Low Risk Adult (2 of 2 - PPSV23) 11/23/2019    Colorectal cancer screening: Type of screening: Colonoscopy. Completed 09/28/19. Repeat every 10 years  Mammogram status: Completed 11/14/19. Repeat every year  Bone Density status: Completed 11/10/18. Results reflect: Bone density results: NORMAL. Repeat every 5 years.   Additional Screening:  Hepatitis C Screening; Completed 01/28/15  Vision Screening: Recommended annual ophthalmology exams for early detection of glaucoma and other disorders of the eye. Is the patient up to date with their annual eye exam?  Yes  Who is the provider or what is the name of the office in which the patient attends annual eye exams? Dr Satira Sark  If pt is not established with a provider, would they like to be referred to a provider to establish care? No .   Dental Screening: Recommended annual dental exams for proper oral hygiene  Community Resource Referral / Chronic Care Management: CRR required this visit?  No   CCM required this visit?  No      Plan:     I have personally reviewed and noted the following in the patient's chart:   . Medical and social history . Use of alcohol, tobacco or illicit drugs  . Current medications and supplements . Functional ability and status . Nutritional status . Physical activity . Advanced directives . List of other physicians . Hospitalizations, surgeries, and ER visits in previous 12 months . Vitals . Screenings to include cognitive, depression, and falls . Referrals and appointments  In addition, I have reviewed and discussed with patient certain preventive protocols, quality metrics, and best practice recommendations. A written personalized care plan for preventive services as well as general preventive health recommendations were provided to patient.     Willette Brace, LPN   [id number]   Nurse Notes: None

## 2020-05-23 NOTE — Patient Instructions (Addendum)
Holly Riley , Thank you for taking time to come for your Medicare Wellness Visit. I appreciate your ongoing commitment to your health goals. Please review the following plan we discussed and let me know if I can assist you in the future.   Screening recommendations/referrals: Colonoscopy: Done 09/28/19 Mammogram: Done 11/16/19 Bone Density: Done 11/10/18 Recommended yearly ophthalmology/optometry visit for glaucoma screening and checkup Recommended yearly dental visit for hygiene and checkup  Vaccinations: Influenza vaccine: Up to date Pneumococcal vaccine: Due and discussed Tdap vaccine: Up to date Shingles vaccine: Shingrix. Please contact your pharmacy for coverage information.    Covid-19:Declined and discussed  Advanced directives: Advance directive discussed with you today. Even though you declined this today please call our office should you change your mind and we can give you the proper paperwork for you to fill out.  Conditions/risks identified: Get A1C down   Next appointment: Follow up in one year for your annual wellness visit    Preventive Care 65 Years and Older, Female Preventive care refers to lifestyle choices and visits with your health care provider that can promote health and wellness. What does preventive care include?  A yearly physical exam. This is also called an annual well check.  Dental exams once or twice a year.  Routine eye exams. Ask your health care provider how often you should have your eyes checked.  Personal lifestyle choices, including:  Daily care of your teeth and gums.  Regular physical activity.  Eating a healthy diet.  Avoiding tobacco and drug use.  Limiting alcohol use.  Practicing safe sex.  Taking low-dose aspirin every day.  Taking vitamin and mineral supplements as recommended by your health care provider. What happens during an annual well check? The services and screenings done by your health care provider during your  annual well check will depend on your age, overall health, lifestyle risk factors, and family history of disease. Counseling  Your health care provider may ask you questions about your:  Alcohol use.  Tobacco use.  Drug use.  Emotional well-being.  Home and relationship well-being.  Sexual activity.  Eating habits.  History of falls.  Memory and ability to understand (cognition).  Work and work Statistician.  Reproductive health. Screening  You may have the following tests or measurements:  Height, weight, and BMI.  Blood pressure.  Lipid and cholesterol levels. These may be checked every 5 years, or more frequently if you are over 33 years old.  Skin check.  Lung cancer screening. You may have this screening every year starting at age 32 if you have a 30-pack-year history of smoking and currently smoke or have quit within the past 15 years.  Fecal occult blood test (FOBT) of the stool. You may have this test every year starting at age 52.  Flexible sigmoidoscopy or colonoscopy. You may have a sigmoidoscopy every 5 years or a colonoscopy every 10 years starting at age 66.  Hepatitis C blood test.  Hepatitis B blood test.  Sexually transmitted disease (STD) testing.  Diabetes screening. This is done by checking your blood sugar (glucose) after you have not eaten for a while (fasting). You may have this done every 1-3 years.  Bone density scan. This is done to screen for osteoporosis. You may have this done starting at age 76.  Mammogram. This may be done every 1-2 years. Talk to your health care provider about how often you should have regular mammograms. Talk with your health care provider about your test  results, treatment options, and if necessary, the need for more tests. Vaccines  Your health care provider may recommend certain vaccines, such as:  Influenza vaccine. This is recommended every year.  Tetanus, diphtheria, and acellular pertussis (Tdap, Td)  vaccine. You may need a Td booster every 10 years.  Zoster vaccine. You may need this after age 23.  Pneumococcal 13-valent conjugate (PCV13) vaccine. One dose is recommended after age 39.  Pneumococcal polysaccharide (PPSV23) vaccine. One dose is recommended after age 64. Talk to your health care provider about which screenings and vaccines you need and how often you need them. This information is not intended to replace advice given to you by your health care provider. Make sure you discuss any questions you have with your health care provider. Document Released: 03/14/2015 Document Revised: 11/05/2015 Document Reviewed: 12/17/2014 Elsevier Interactive Patient Education  2017 [place] Prevention in the Home Falls can cause injuries. They can happen to people of all ages. There are many things you can do to make your home safe and to help prevent falls. What can I do on the outside of my home?  Regularly fix the edges of walkways and driveways and fix any cracks.  Remove anything that might make you trip as you walk through a door, such as a raised step or threshold.  Trim any bushes or trees on the path to your home.  Use bright outdoor lighting.  Clear any walking paths of anything that might make someone trip, such as rocks or tools.  Regularly check to see if handrails are loose or broken. Make sure that both sides of any steps have handrails.  Any raised decks and porches should have guardrails on the edges.  Have any leaves, snow, or ice cleared regularly.  Use sand or salt on walking paths during winter.  Clean up any spills in your garage right away. This includes oil or grease spills. What can I do in the bathroom?  Use night lights.  Install grab bars by the toilet and in the tub and shower. Do not use towel bars as grab bars.  Use non-skid mats or decals in the tub or shower.  If you need to sit down in the shower, use a plastic, non-slip  stool.  Keep the floor dry. Clean up any water that spills on the floor as soon as it happens.  Remove soap buildup in the tub or shower regularly.  Attach bath mats securely with double-sided non-slip rug tape.  Do not have throw rugs and other things on the floor that can make you trip. What can I do in the bedroom?  Use night lights.  Make sure that you have a light by your bed that is easy to reach.  Do not use any sheets or blankets that are too big for your bed. They should not hang down onto the floor.  Have a firm chair that has side arms. You can use this for support while you get dressed.  Do not have throw rugs and other things on the floor that can make you trip. What can I do in the kitchen?  Clean up any spills right away.  Avoid walking on wet floors.  Keep items that you use a lot in easy-to-reach places.  If you need to reach something above you, use a strong step stool that has a grab bar.  Keep electrical cords out of the way.  Do not use floor polish or wax that makes  floors slippery. If you must use wax, use non-skid floor wax.  Do not have throw rugs and other things on the floor that can make you trip. What can I do with my stairs?  Do not leave any items on the stairs.  Make sure that there are handrails on both sides of the stairs and use them. Fix handrails that are broken or loose. Make sure that handrails are as long as the stairways.  Check any carpeting to make sure that it is firmly attached to the stairs. Fix any carpet that is loose or worn.  Avoid having throw rugs at the top or bottom of the stairs. If you do have throw rugs, attach them to the floor with carpet tape.  Make sure that you have a light switch at the top of the stairs and the bottom of the stairs. If you do not have them, ask someone to add them for you. What else can I do to help prevent falls?  Wear shoes that:  Do not have high heels.  Have rubber bottoms.  Are  comfortable and fit you well.  Are closed at the toe. Do not wear sandals.  If you use a stepladder:  Make sure that it is fully opened. Do not climb a closed stepladder.  Make sure that both sides of the stepladder are locked into place.  Ask someone to hold it for you, if possible.  Clearly mark and make sure that you can see:  Any grab bars or handrails.  First and last steps.  Where the edge of each step is.  Use tools that help you move around (mobility aids) if they are needed. These include:  Canes.  Walkers.  Scooters.  Crutches.  Turn on the lights when you go into a dark area. Replace any light bulbs as soon as they burn out.  Set up your furniture so you have a clear path. Avoid moving your furniture around.  If any of your floors are uneven, fix them.  If there are any pets around you, be aware of where they are.  Review your medicines with your doctor. Some medicines can make you feel dizzy. This can increase your chance of falling. Ask your doctor what other things that you can do to help prevent falls. This information is not intended to replace advice given to you by your health care provider. Make sure you discuss any questions you have with your health care provider. Document Released: 12/12/2008 Document Revised: 07/24/2015 Document Reviewed: 03/22/2014 Elsevier Interactive Patient Education  2017 [employer].

## 2020-07-04 ENCOUNTER — Ambulatory Visit (INDEPENDENT_AMBULATORY_CARE_PROVIDER_SITE_OTHER): Payer: Medicare HMO | Admitting: Family Medicine

## 2020-07-04 ENCOUNTER — Encounter: Payer: Self-pay | Admitting: Family Medicine

## 2020-07-04 ENCOUNTER — Other Ambulatory Visit: Payer: Self-pay

## 2020-07-04 VITALS — BP 130/80 | HR 83 | Temp 97.8°F | Ht 66.0 in | Wt 202.2 lb

## 2020-07-04 DIAGNOSIS — Z789 Other specified health status: Secondary | ICD-10-CM | POA: Diagnosis not present

## 2020-07-04 DIAGNOSIS — E1169 Type 2 diabetes mellitus with other specified complication: Secondary | ICD-10-CM | POA: Diagnosis not present

## 2020-07-04 DIAGNOSIS — Z7185 Encounter for immunization safety counseling: Secondary | ICD-10-CM

## 2020-07-04 DIAGNOSIS — E119 Type 2 diabetes mellitus without complications: Secondary | ICD-10-CM

## 2020-07-04 DIAGNOSIS — Z23 Encounter for immunization: Secondary | ICD-10-CM | POA: Diagnosis not present

## 2020-07-04 DIAGNOSIS — E039 Hypothyroidism, unspecified: Secondary | ICD-10-CM | POA: Diagnosis not present

## 2020-07-04 DIAGNOSIS — E782 Mixed hyperlipidemia: Secondary | ICD-10-CM

## 2020-07-04 LAB — POCT GLYCOSYLATED HEMOGLOBIN (HGB A1C): Hemoglobin A1C: 6 % — AB (ref 4.0–5.6)

## 2020-07-04 MED ORDER — SHINGRIX 50 MCG/0.5ML IM SUSR: 0.5000 mL | Freq: Once | INTRAMUSCULAR | 0 refills | Status: AC

## 2020-07-04 MED ORDER — LEVOTHYROXINE SODIUM 88 MCG PO TABS: 88.0000 ug | ORAL_TABLET | Freq: Every day | ORAL | 3 refills | Status: DC

## 2020-07-04 NOTE — Progress Notes (Signed)
Subjective  CC:  Chief Complaint  Patient presents with  . Diabetes  . Hyperlipidemia  . Hypothyroidism    HPI: Holly Riley is a 65 y.o. female who presents to the office today for follow up of diabetes and problems listed above in the chief complaint.   Diabetes follow up: Her diabetic control is reported as Improved.  She has been eating better, stress is decreased.  She has lost some weight.  She remains diet controlled. She denies exertional CP or SOB or symptomatic hypoglycemia. She denies foot sores or paresthesias.  Due for Pneumovax.  Hypothyroidism: Last TSH was 2.5.  She prefers it to be less than 2.  Feels better and has better energy levels.  Also easier to maintain her weight.  Would like dose to be adjusted.   Wt Readings from Last 3 Encounters:  07/04/20 202 lb 3.2 oz (91.7 kg)  04/04/20 210 lb (95.3 kg)  10/18/19 193 lb 12.8 oz (87.9 kg)    BP Readings from Last 3 Encounters:  07/04/20 130/80  04/04/20 120/70  10/18/19 (!) 146/92    Assessment  1. Controlled type 2 diabetes mellitus without complication, without long-term current use of insulin ([place])   2. Combined hyperlipidemia associated with type 2 diabetes mellitus ([facility])   3. Statin intolerance   4. Acquired hypothyroidism   5. Vaccine counseling      Plan   Diabetes is currently very well controlled.  Diet controlled.  Continue diabetic diet and weight loss.  Updated Pneumovax today.  Shingrix vaccine counseling done.  Prescription given.  Hypothyroidism: We will push TSH to less than 2.  Increase levothyroxine dose to 88 mcg daily.  Recheck levels in 3 months.  Continue Zetia for hyperlipidemia.  She has statin induced myalgias.  Follow up: 3 months for complete physical and follow-up thyroid and diabetes. Orders Placed This Encounter  Procedures  . POCT HgB A1C   Meds ordered this encounter  Medications  . Zoster Vaccine Adjuvanted [facility]) injection    Sig: Inject 0.5 mLs into  the muscle once for 1 dose. Please give 2nd dose 2-6 months after first dose    Dispense:  2 each    Refill:  0  . levothyroxine (SYNTHROID) 88 MCG tablet    Sig: Take 1 tablet (88 mcg total) by mouth daily before breakfast.    Dispense:  90 tablet    Refill:  3      Immunization History  Administered Date(s) Administered  . DTaP 04/27/2006  . Hepatitis B, ped/adol 04/28/1991  . Influenza Split 12/26/2007  . Influenza, Quadrivalent, Recombinant, Inj, Pf 01/22/2013  . Influenza, Seasonal, Injecte, Preservative Fre 12/13/2013, 11/20/2014  . Influenza,inj,Quad PF,6+ Mos 12/17/2015, 01/07/2019  . Influenza-Unspecified 12/14/2016, 02/08/2020  . Pneumococcal Conjugate-13 11/23/2018  . Pneumococcal Polysaccharide-23 08/27/2014  . Tdap 10/19/2014    Diabetes Related Lab Review: Lab Results  Component Value Date   HGBA1C 6.0 (A) 07/04/2020   HGBA1C 7.1 (H) 04/04/2020   HGBA1C 5.8 (A) 10/18/2019    Lab Results  Component Value Date   MICROALBUR <0.2 10/18/2019   Lab Results  Component Value Date   CREATININE 0.92 10/18/2019   BUN 18 10/18/2019   NA 138 10/18/2019   K 4.3 10/18/2019   CL 102 10/18/2019   CO2 28 10/18/2019   Lab Results  Component Value Date   CHOL 214 (H) 10/18/2019   CHOL 228 (H) 10/10/2018   CHOL 237 (H) 02/09/2018   Lab Results  Component Value Date   HDL 79 10/18/2019   HDL 73.10 10/10/2018   HDL 69.20 02/09/2018   Lab Results  Component Value Date   LDLCALC 115 (H) 10/18/2019   LDLCALC 137 (H) 10/10/2018   LDLCALC 146 (H) 02/09/2018   Lab Results  Component Value Date   TRIG 92 10/18/2019   TRIG 91.0 10/10/2018   TRIG 107.0 02/09/2018   Lab Results  Component Value Date   CHOLHDL 2.7 10/18/2019   CHOLHDL 3 10/10/2018   CHOLHDL 3 02/09/2018   Lab Results  Component Value Date   TSH 2.50 04/04/2020    No results found for: LDLDIRECT The 10-year ASCVD risk score Mikey Bussing DC Jr., et al., 2013) is: 10.9%   Values used to calculate  the score:     Age: 1 years     Sex: Female     Is Non-Hispanic African American: No     Diabetic: Yes     Tobacco smoker: No     Systolic Blood Pressure: 220 mmHg     Is BP treated: No     HDL Cholesterol: 79 mg/dL     Total Cholesterol: 214 mg/dL I have reviewed the PMH, Fam and Soc history. Patient Active Problem List   Diagnosis Date Noted  . Statin intolerance 03/20/2017    Priority: High    Myalgias, simvistatin and crestor   . Controlled type 2 diabetes mellitus without complication, without long-term current use of insulin ([place]) 02/17/2017    Priority: High  . Combined hyperlipidemia associated with type 2 diabetes mellitus ([place]) 02/17/2017    Priority: High  . Spinal stenosis     Priority: High    NS - early, MRI   . Hypothyroidism 03/26/2013    Priority: High  . Obesity (BMI 30.0-34.9) 01/22/2013    Priority: High  . Insomnia 09/11/2013    Priority: Medium  . Osteoarthritis 04/28/2011    Priority: Medium  . Vitamin D deficiency 03/20/2017    Priority: Low  . Allergic rhinitis 11/25/2009    Priority: Low  . Osteoporosis screening 11/16/2018    Normal bone density 2020; recheck 2025     Social History: Patient  reports that she has never smoked. She has never used smokeless tobacco. She reports that she does not drink alcohol and does not use drugs.  Review of Systems: Ophthalmic: negative for eye pain, loss of vision or double vision Cardiovascular: negative for chest pain Respiratory: negative for SOB or persistent cough Gastrointestinal: negative for abdominal pain Genitourinary: negative for dysuria or gross hematuria MSK: negative for foot lesions Neurologic: negative for weakness or gait disturbance  Objective  Vitals: BP 130/80   Pulse 83   Temp 97.8 F (36.6 C) (Temporal)   Ht 5\' 6"  (1.676 m)   Wt 202 lb 3.2 oz (91.7 kg)   SpO2 98%   BMI 32.64 kg/m  General: well appearing, no acute distress  Psych:  Alert and oriented, normal mood and  affect   Diabetic education: ongoing education regarding chronic disease management for diabetes was given today. We continue to reinforce the ABC's of diabetic management: A1c (<7 or 8 dependent upon patient), tight blood pressure control, and cholesterol management with goal LDL < 100 minimally. We discuss diet strategies, exercise recommendations, medication options and possible side effects. At each visit, we review recommended immunizations and preventive care recommendations for diabetics and stress that good diabetic control can prevent other problems. See below for this patient's data.  Commons side effects, risks, benefits, and alternatives for medications and treatment plan prescribed today were discussed, and the patient expressed understanding of the given instructions. Patient is instructed to call or message via MyChart if he/she has any questions or concerns regarding our treatment plan. No barriers to understanding were identified. We discussed Red Flag symptoms and signs in detail. Patient expressed understanding regarding what to do in case of urgent or emergency type symptoms.   Medication list was reconciled, printed and provided to the patient in AVS. Patient instructions and summary information was reviewed with the patient as documented in the AVS. This note was prepared with assistance of Dragon voice recognition software. Occasional wrong-word or sound-a-like substitutions may have occurred due to the inherent limitations of voice recognition software  This visit occurred during the SARS-CoV-2 public health emergency.  Safety protocols were in place, including screening questions prior to the visit, additional usage of staff PPE, and extensive cleaning of exam room while observing appropriate contact time as indicated for disinfecting solutions.

## 2020-07-04 NOTE — Addendum Note (Signed)
Addended by: Gean Birchwood on: 07/04/2020 09:07 AM   Modules accepted: Orders

## 2020-07-04 NOTE — Patient Instructions (Signed)
Please return in august for your annual complete physical; please come fasting. We will recheck your diabetes and thyroid at that time as well.   I have increased the dose of the levothryoxine.   Please take the prescription for Shingrix to the pharmacy so they may administer the vaccinations. Your insurance will then cover the injections.   Today you were given your pneumovax vaccination.   If you have any questions or concerns, please don't hesitate to send me a message via MyChart or call the office at [phone]. Thank you for visiting with [place] today! It's our pleasure caring for you.

## 2020-08-18 DIAGNOSIS — D2262 Melanocytic nevi of left upper limb, including shoulder: Secondary | ICD-10-CM | POA: Diagnosis not present

## 2020-08-18 DIAGNOSIS — Z85828 Personal history of other malignant neoplasm of skin: Secondary | ICD-10-CM | POA: Diagnosis not present

## 2020-08-18 DIAGNOSIS — D225 Melanocytic nevi of trunk: Secondary | ICD-10-CM | POA: Diagnosis not present

## 2020-08-18 DIAGNOSIS — L821 Other seborrheic keratosis: Secondary | ICD-10-CM | POA: Diagnosis not present

## 2020-08-18 DIAGNOSIS — D2271 Melanocytic nevi of right lower limb, including hip: Secondary | ICD-10-CM | POA: Diagnosis not present

## 2020-08-18 DIAGNOSIS — I788 Other diseases of capillaries: Secondary | ICD-10-CM | POA: Diagnosis not present

## 2020-08-18 DIAGNOSIS — L570 Actinic keratosis: Secondary | ICD-10-CM | POA: Diagnosis not present

## 2020-08-18 DIAGNOSIS — D2261 Melanocytic nevi of right upper limb, including shoulder: Secondary | ICD-10-CM | POA: Diagnosis not present

## 2020-08-18 DIAGNOSIS — D224 Melanocytic nevi of scalp and neck: Secondary | ICD-10-CM | POA: Diagnosis not present

## 2020-08-21 ENCOUNTER — Telehealth: Payer: Self-pay

## 2020-08-21 NOTE — Chronic Care Management (AMB) (Signed)
Chronic Care Management Pharmacy Assistant   Name: Holly Riley  MRN: [id number] DOB: 1955/10/22   Reason for Encounter:Hypertension Disease State call  Recent office visits:  07/04/20- Billey Chang MD (PCP)- Office visit for management of chronic conditions.  Shingles vaccine prescription given to take to pharmacy. CHANGED Levothyroxine from 75 MCG to 88 MCG take 1 daily.  Folw up as scheduled.  04/04/20-Camille Andy MD (PCP)- Office vsiit for management of chronic conditions. DISCONTINUED Cinnamon 500mg , Fluticasone 33mcg, Red yeast rice extract 600mg , Trazodone 100mg  and Turmeric. Follow up in 6 months.   Recent consult visits:  None  Hospital visits:  None in previous 6 months  Medications: Outpatient Encounter Medications as of 08/21/2020  Medication Sig   acetaminophen (TYLENOL) 500 MG tablet Take 500 mg by mouth every 6 (six) hours as needed.   Barberry-Oreg Grape-Goldenseal (BERBERINE COMPLEX PO)    Cholecalciferol (VITAMIN D3) 50 MCG (2000 UT) capsule Take 2,000 Units by mouth daily.    Coenzyme Q10 (CO Q 10 PO)    ezetimibe (ZETIA) 10 MG tablet TAKE 1 TABLET(10 MG) BY MOUTH DAILY   Flaxseed, Linseed, (FLAX SEED OIL PO) Take 1 tablet by mouth daily.   FLUCELVAX QUADRIVALENT 0.5 ML injection    Garlic 10 MG CAPS Take 10 mg by mouth daily.   levothyroxine (SYNTHROID) 88 MCG tablet Take 1 tablet (88 mcg total) by mouth daily before breakfast.   loratadine (CLARITIN) 10 MG tablet Take by mouth.   Magnesium 250 MG TABS Take 1 tablet by mouth daily.    Omega-3 Fatty Acids (FISH OIL CONCENTRATE) 1000 MG CAPS Take 1,000 mg by mouth daily.   Red Yeast Rice 600 MG TABS    Zinc Sulfate (ZINC 15 PO) Take by mouth.   No facility-administered encounter medications on file as of 08/21/2020.   Reviewed chart prior to disease state call. Spoke with patient regarding BP  Recent Office Vitals: BP Readings from Last 3 Encounters:  07/04/20 130/80  04/04/20 120/70  10/18/19 (!)  146/92   Pulse Readings from Last 3 Encounters:  07/04/20 83  04/04/20 78  10/18/19 92    Wt Readings from Last 3 Encounters:  07/04/20 202 lb 3.2 oz (91.7 kg)  04/04/20 210 lb (95.3 kg)  10/18/19 193 lb 12.8 oz (87.9 kg)     Kidney Function Lab Results  Component Value Date/Time   CREATININE 0.92 10/18/2019 10:14 AM   CREATININE 0.9 07/31/2019 12:00 AM   CREATININE 0.85 10/10/2018 10:28 AM   CREATININE 0.91 02/09/2018 08:22 AM   GFR 67.11 10/10/2018 10:28 AM   GFRNONAA 64 07/31/2019 12:00 AM   GFRAA 74 07/31/2019 12:00 AM    BMP Latest Ref Rng & Units 10/18/2019 07/31/2019 10/10/2018  Glucose 65 - 99 mg/dL 131(H) - 122(H)  BUN 7 - 25 mg/dL 18 14 11   Creatinine 0.50 - 0.99 mg/dL 0.92 0.9 0.85  BUN/Creat Ratio 6 - 22 (calc) NOT APPLICABLE - -  Sodium 124 - 146 mmol/L 138 139 139  Potassium 3.5 - 5.3 mmol/L 4.3 4.2 3.9  Chloride 98 - 110 mmol/L 102 101 104  CO2 20 - 32 mmol/L 28 22 26   Calcium 8.6 - 10.4 mg/dL 9.3 9.1 9.2    Recent Relevant Labs: Lab Results  Component Value Date/Time   HGBA1C 6.0 (A) 07/04/2020 08:30 AM   HGBA1C 7.1 (H) 04/04/2020 09:11 AM   HGBA1C 5.8 (A) 10/18/2019 09:43 AM   HGBA1C 5.8 02/09/2018 08:14 AM   HGBA1C 6.4  05/17/2017 09:49 AM   HGBA1C 5.8 10/28/2016 12:00 AM   MICROALBUR <0.2 10/18/2019 10:14 AM   MICROALBUR <0.7 10/10/2018 10:28 AM    Kidney Function Lab Results  Component Value Date/Time   CREATININE 0.92 10/18/2019 10:14 AM   CREATININE 0.9 07/31/2019 12:00 AM   CREATININE 0.85 10/10/2018 10:28 AM   CREATININE 0.91 02/09/2018 08:22 AM   GFR 67.11 10/10/2018 10:28 AM   GFRNONAA 64 07/31/2019 12:00 AM   GFRAA 74 07/31/2019 12:00 AM    Current antihyperglycemic regimen:  None What recent interventions/DTPs have been made to improve glycemic control:  None Have there been any recent hospitalizations or ED visits since last visit with CPP? No Patient denies hypoglycemic symptoms, including Pale, Sweaty, Shaky, Hungry,  Nervous/irritable, and Vision changes Patient denies hyperglycemic symptoms, including blurry vision, excessive thirst, fatigue, polyuria, and weakness How often are you checking your blood sugar? Never checks What are your blood sugars ranging?  Fasting: N/A Before meals: N/A After meals: N/A Bedtime: N/A During the week, how often does your blood glucose drop below 70? Doesn't check.  Are you checking your feet daily/regularly?        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   Adherence Review: Is the patient currently on a STATIN medication? No Is the patient currently on ACE/ARB medication? No Does the patient have >5 day gap between last estimated fill dates? No   Star Rating Drugs:  None  Leeroy Bock Kiln

## 2020-10-15 ENCOUNTER — Telehealth: Payer: Self-pay | Admitting: Pharmacist

## 2020-10-15 NOTE — Chronic Care Management (AMB) (Addendum)
    Chronic Care Management Pharmacy Assistant   Name: Holly Riley  MRN: [id number] DOB: [birth year]  Reason for Encounter: Diabetes Adherence Call    Recent office visits:  None  Recent consult visits:  None  Hospital visits:  None in previous 6 months  Medications: Outpatient Encounter Medications as of 10/15/2020  Medication Sig   acetaminophen (TYLENOL) 500 MG tablet Take 500 mg by mouth every 6 (six) hours as needed.   Barberry-Oreg Grape-Goldenseal (BERBERINE COMPLEX PO)    Cholecalciferol (VITAMIN D3) 50 MCG (2000 UT) capsule Take 2,000 Units by mouth daily.    Coenzyme Q10 (CO Q 10 PO)    ezetimibe (ZETIA) 10 MG tablet TAKE 1 TABLET(10 MG) BY MOUTH DAILY   Flaxseed, Linseed, (FLAX SEED OIL PO) Take 1 tablet by mouth daily.   FLUCELVAX QUADRIVALENT 0.5 ML injection    Garlic 10 MG CAPS Take 10 mg by mouth daily.   levothyroxine (SYNTHROID) 88 MCG tablet Take 1 tablet (88 mcg total) by mouth daily before breakfast.   loratadine (CLARITIN) 10 MG tablet Take by mouth.   Magnesium 250 MG TABS Take 1 tablet by mouth daily.    Omega-3 Fatty Acids (FISH OIL CONCENTRATE) 1000 MG CAPS Take 1,000 mg by mouth daily.   Red Yeast Rice 600 MG TABS    Zinc Sulfate (ZINC 15 PO) Take by mouth.   No facility-administered encounter medications on file as of 10/15/2020.   Recent Relevant Labs: Lab Results  Component Value Date/Time   HGBA1C 6.0 (A) 07/04/2020 08:30 AM   HGBA1C 7.1 (H) 04/04/2020 09:11 AM   HGBA1C 5.8 (A) 10/18/2019 09:43 AM   HGBA1C 5.8 02/09/2018 08:14 AM   HGBA1C 6.4 05/17/2017 09:49 AM   HGBA1C 5.8 10/28/2016 12:00 AM   MICROALBUR <0.2 10/18/2019 10:14 AM   MICROALBUR <0.7 10/10/2018 10:28 AM    Kidney Function Lab Results  Component Value Date/Time   CREATININE 0.92 10/18/2019 10:14 AM   CREATININE 0.9 07/31/2019 12:00 AM   CREATININE 0.85 10/10/2018 10:28 AM   CREATININE 0.91 02/09/2018 08:22 AM   GFR 67.11 10/10/2018 10:28 AM   GFRNONAA 64  07/31/2019 12:00 AM   GFRAA 74 07/31/2019 12:00 AM    Current antihyperglycemic regimen:  None  What recent interventions/DTPs have been made to improve glycemic control:  No interventions or DTPs.  Have there been any recent hospitalizations or ED visits since last visit with CPP? No  Patient denies hypoglycemic symptoms.  Patient denies hyperglycemic symptoms.  How often are you checking your blood sugar? Patient states she does not check her blood sugar.  What are your blood sugars ranging?  Fasting: n/a Before meals: n/a After meals: n/a Bedtime: n/a  During the week, how often does your blood glucose drop below 70? Never  Are you checking your feet daily/regularly? Yes, patient states she checks her feet daily.  Adherence Review: Is the patient currently on a STATIN medication? No Is the patient currently on ACE/ARB medication? No Does the patient have >5 day gap between last estimated fill dates? No   Future Appointments  Date Time Provider [place]  10/21/2020  9:00 AM Leamon Arnt, MD [facility]  12/10/2020  1:30 PM [facility] CCM PHARMACIST [facility]  05/29/2021  8:45 AM [facility] HEALTH COACH [facility]     Star Rating Drugs: None  April D Calhoun, [place] Pharmacist Assistant [phone]

## 2020-10-21 ENCOUNTER — Other Ambulatory Visit: Payer: Self-pay

## 2020-10-21 ENCOUNTER — Ambulatory Visit (INDEPENDENT_AMBULATORY_CARE_PROVIDER_SITE_OTHER): Payer: Medicare HMO | Admitting: Family Medicine

## 2020-10-21 ENCOUNTER — Encounter: Payer: Self-pay | Admitting: Family Medicine

## 2020-10-21 VITALS — BP 126/74 | HR 80 | Temp 97.6°F | Wt 207.4 lb

## 2020-10-21 DIAGNOSIS — E039 Hypothyroidism, unspecified: Secondary | ICD-10-CM | POA: Diagnosis not present

## 2020-10-21 DIAGNOSIS — E782 Mixed hyperlipidemia: Secondary | ICD-10-CM

## 2020-10-21 DIAGNOSIS — E559 Vitamin D deficiency, unspecified: Secondary | ICD-10-CM | POA: Diagnosis not present

## 2020-10-21 DIAGNOSIS — G4701 Insomnia due to medical condition: Secondary | ICD-10-CM

## 2020-10-21 DIAGNOSIS — G729 Myopathy, unspecified: Secondary | ICD-10-CM

## 2020-10-21 DIAGNOSIS — E1169 Type 2 diabetes mellitus with other specified complication: Secondary | ICD-10-CM

## 2020-10-21 DIAGNOSIS — Z0000 Encounter for general adult medical examination without abnormal findings: Secondary | ICD-10-CM

## 2020-10-21 DIAGNOSIS — E119 Type 2 diabetes mellitus without complications: Secondary | ICD-10-CM

## 2020-10-21 LAB — COMPREHENSIVE METABOLIC PANEL
ALT: 36 U/L — ABNORMAL HIGH (ref 0–35)
AST: 23 U/L (ref 0–37)
Albumin: 3.5 g/dL (ref 3.5–5.2)
Alkaline Phosphatase: 88 U/L (ref 39–117)
BUN: 14 mg/dL (ref 6–23)
CO2: 27 mEq/L (ref 19–32)
Calcium: 9 mg/dL (ref 8.4–10.5)
Chloride: 105 mEq/L (ref 96–112)
Creatinine, Ser: 0.86 mg/dL (ref 0.40–1.20)
GFR: 70.06 mL/min (ref >60.00)
Glucose, Bld: 136 mg/dL — ABNORMAL HIGH (ref 70–99)
Potassium: 4 mEq/L (ref 3.5–5.1)
Sodium: 140 mEq/L (ref 135–145)
Total Bilirubin: 0.3 mg/dL (ref 0.2–1.2)
Total Protein: 6.6 g/dL (ref 6.0–8.3)

## 2020-10-21 LAB — CBC WITH DIFFERENTIAL/PLATELET
Basophils Absolute: 0 10*3/uL (ref 0.0–0.1)
Basophils Relative: 0.8 % (ref 0.0–3.0)
Eosinophils Absolute: 0.2 10*3/uL (ref 0.0–0.7)
Eosinophils Relative: 3.3 % (ref 0.0–5.0)
HCT: 42.2 % (ref 36.0–46.0)
Hemoglobin: 14.2 g/dL (ref 12.0–15.0)
Lymphocytes Relative: 31.6 % (ref 12.0–46.0)
Lymphs Abs: 1.8 10*3/uL (ref 0.7–4.0)
MCHC: 33.7 g/dL (ref 30.0–36.0)
MCV: 96.7 fl (ref 78.0–100.0)
Monocytes Absolute: 0.6 10*3/uL (ref 0.1–1.0)
Monocytes Relative: 10.2 % (ref 3.0–12.0)
Neutro Abs: 3.1 10*3/uL (ref 1.4–7.7)
Neutrophils Relative %: 54.1 % (ref 43.0–77.0)
Platelets: 273 10*3/uL (ref 150.0–400.0)
RBC: 4.36 Mil/uL (ref 3.87–5.11)
RDW: 12.8 % (ref 11.5–15.5)
WBC: 5.7 10*3/uL (ref 4.0–10.5)

## 2020-10-21 LAB — MICROALBUMIN / CREATININE URINE RATIO
Creatinine,U: 93.9 mg/dL
Microalb Creat Ratio: 0.7 mg/g (ref 0.0–30.0)
Microalb, Ur: <0.7 mg/dL (ref 0.0–1.9)

## 2020-10-21 LAB — VITAMIN D 25 HYDROXY (VIT D DEFICIENCY, FRACTURES): VITD: 42.73 ng/mL (ref 30.00–100.00)

## 2020-10-21 LAB — LIPID PANEL
Cholesterol: 175 mg/dL (ref 0–200)
HDL: 68 mg/dL (ref >39.00)
LDL Cholesterol: 92 mg/dL (ref 0–99)
NonHDL: 107.03
Total CHOL/HDL Ratio: 3
Triglycerides: 74 mg/dL (ref 0.0–149.0)
VLDL: 14.8 mg/dL (ref 0.0–40.0)

## 2020-10-21 LAB — HEMOGLOBIN A1C: Hgb A1c MFr Bld: 7.2 % — ABNORMAL HIGH (ref 4.6–6.5)

## 2020-10-21 LAB — TSH: TSH: 1.37 u[IU]/mL (ref 0.35–5.50)

## 2020-10-21 MED ORDER — SHINGRIX 50 MCG/0.5ML IM SUSR: 0.5000 mL | Freq: Once | INTRAMUSCULAR | 0 refills | Status: AC

## 2020-10-21 NOTE — Progress Notes (Signed)
Subjective  Chief Complaint  Patient presents with   Annual Exam    Fasting     HPI: Holly Riley is a 65 y.o. female who presents to [facility] [place] today for a Female Wellness Visit. She also has the concerns and/or needs as listed above in the chief complaint. These will be addressed in addition to the Health Maintenance Visit.   Wellness Visit: annual visit with health maintenance review and exam without Pap  HM: Screens are up-to-date.  Eligible for Shingrix vaccination.  Overall doing well. Chronic disease f/u and/or acute problem visit: (deemed necessary to be done in addition to the wellness visit): Diabetes follow up: Her diabetic control is reported as Unchanged.  She remains diet controlled.  She denies exertional CP or SOB or symptomatic hypoglycemia. She denies foot sores or paresthesias. Hypothyroidism: Due for recheck on increased dose of levothyroxine.  She is compliant with medication.  No symptoms of high or low thyroid at this time. 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  Tolerates well.  Due for recheck.  Goal LDL less than 70. Vitamin D deficiency: Due for recheck.  She takes over-the-counter vitamin D3 2000 units daily. Insomnia: Improved.  No longer requiring medications.  Immunization History  Administered Date(s) Administered   DTaP 04/27/2006   Hepatitis B, ped/adol 04/28/1991   Influenza Split 12/26/2007   Influenza, Quadrivalent, Recombinant, Inj, Pf 01/22/2013   Influenza, Seasonal, Injecte, Preservative Fre 12/13/2013, 11/20/2014   Influenza,inj,Quad PF,6+ Mos 12/17/2015, 01/07/2019   Influenza-Unspecified 12/14/2016, 02/08/2020   Pneumococcal Conjugate-13 11/23/2018   Pneumococcal Polysaccharide-23 08/27/2014, 07/04/2020   Tdap 10/19/2014    Diabetes Related Lab Review: Lab Results  Component Value Date   HGBA1C 7.2 (H) 10/21/2020   HGBA1C 6.0 (A) 07/04/2020   HGBA1C 7.1 (H) 04/04/2020    Lab Results   Component Value Date   MICROALBUR <0.7 10/21/2020   Lab Results  Component Value Date   CREATININE 0.86 10/21/2020   BUN 14 10/21/2020   NA 140 10/21/2020   K 4.0 10/21/2020   CL 105 10/21/2020   CO2 27 10/21/2020   Lab Results  Component Value Date   CHOL 175 10/21/2020   CHOL 214 (H) 10/18/2019   CHOL 228 (H) 10/10/2018   Lab Results  Component Value Date   HDL 68.00 10/21/2020   HDL 79 10/18/2019   HDL 73.10 10/10/2018   Lab Results  Component Value Date   LDLCALC 92 10/21/2020   LDLCALC 115 (H) 10/18/2019   LDLCALC 137 (H) 10/10/2018   Lab Results  Component Value Date   TRIG 74.0 10/21/2020   TRIG 92 10/18/2019   TRIG 91.0 10/10/2018   Lab Results  Component Value Date   CHOLHDL 3 10/21/2020   CHOLHDL 2.7 10/18/2019   CHOLHDL 3 10/10/2018   No results found for: LDLDIRECT The 10-year ASCVD risk score Mikey Bussing DC Jr., et al., 2013) is: 11.1%   Values used to calculate the score:     Age: 65 years     Sex: Female     Is Non-Hispanic African American: No     Diabetic: Yes     Tobacco smoker: No     Systolic Blood Pressure: 123XX123 mmHg     Is BP treated: No     HDL Cholesterol: 68 mg/dL     Total Cholesterol: 175 mg/dL  BP Readings from Last 3 Encounters:  10/21/20 126/74  07/04/20 130/80  04/04/20 120/70   Wt Readings  from Last 3 Encounters:  10/21/20 207 lb 6.4 oz (94.1 kg)  07/04/20 202 lb 3.2 oz (91.7 kg)  04/04/20 210 lb (95.3 kg)    Health Maintenance  Topic Date Due   Zoster Vaccines- Shingrix (1 of 2) Never done   INFLUENZA VACCINE  09/29/2020   COVID-19 Vaccine (1) 02/28/2021 (Originally 09/15/1960)   MAMMOGRAM  11/13/2020   OPHTHALMOLOGY EXAM  12/18/2020   HEMOGLOBIN A1C  04/23/2021   FOOT EXAM  10/21/2021   URINE MICROALBUMIN  10/21/2021   DEXA SCAN  11/10/2023   TETANUS/TDAP  10/18/2024   COLONOSCOPY (Pts 45-33yr Insurance coverage will need to be confirmed)  09/27/2029   Hepatitis C Screening  Completed   PNA vac Low Risk Adult   Completed   HPV VACCINES  Aged Out      Assessment  1. Annual physical exam   2. Controlled type 2 diabetes mellitus without complication, without long-term current use of insulin ([place]   3. Myopathy, unspecified   4. Combined hyperlipidemia associated with type 2 diabetes mellitus ([place]   5. Acquired hypothyroidism   6. Vitamin D deficiency   7. Insomnia due to medical condition      Plan  Female Wellness Visit: Age appropriate Health Maintenance and Prevention measures were discussed with patient. Included topics are cancer screening recommendations, ways to keep healthy (see AVS) including dietary and exercise recommendations, regular eye and dental care, use of seat belts, and avoidance of moderate alcohol use and tobacco use.  Screens up-to-date BMI: discussed patient's BMI and encouraged positive lifestyle modifications to help get to or maintain a target BMI. HM needs and immunizations were addressed and ordered. See below for orders. See HM and immunization section for updates.  Prescription for Shingrix given with counseling and education Routine labs and screening tests ordered including cmp, cbc and lipids where appropriate. Discussed recommendations regarding Vit D and calcium supplementation (see AVS)  Chronic disease management visit and/or acute problem visit: Diet-controlled diabetes: Check A1c and continue to monitor.  Has been very well controlled.  Eye exam due soon.  No complications.  Continue weight loss. Hypothyroidism for recheck today.  Clinically euthyroid.  Continue levothyroxine 88 mcg daily. Hyperlipidemia intolerant to statins and Zetia 10 mg nightly.  Also takes red yeast rice.  Recheck levels. Obesity: Mild weight gain.  Patient to start eating better again.  Start exercising. Insomnia: Improved. Recheck vitamin D levels.  Adjust supplements if needed.  Follow up: Return in about 6 months (around 04/23/2021) for follow up Diabetes.  Orders Placed This  Encounter  Procedures   CBC with Differential/Platelet   Comprehensive metabolic panel   Lipid panel   TSH   VITAMIN D 25 Hydroxy (Vit-D Deficiency, Fractures)   Hemoglobin A1c   Microalbumin / creatinine urine ratio   Meds ordered this encounter  Medications   Zoster Vaccine Adjuvanted ([facility] injection    Sig: Inject 0.5 mLs into the muscle once for 1 dose. Please give 2nd dose 2-6 months after first dose    Dispense:  2 each    Refill:  0      Body mass index is 33.48 kg/m. Wt Readings from Last 3 Encounters:  10/21/20 207 lb 6.4 oz (94.1 kg)  07/04/20 202 lb 3.2 oz (91.7 kg)  04/04/20 210 lb (95.3 kg)     Patient Active Problem List   Diagnosis Date Noted   Statin intolerance 03/20/2017    Priority: High    Myalgias, simvistatin and crestor  Controlled type 2 diabetes mellitus without complication, without long-term current use of insulin ([place]) 02/17/2017    Priority: High   Combined hyperlipidemia associated with type 2 diabetes mellitus ([place]) 02/17/2017    Priority: High   Spinal stenosis     Priority: High    NS - early, MRI    Hypothyroidism 03/26/2013    Priority: High   Obesity (BMI 30.0-34.9) 01/22/2013    Priority: High   Insomnia 09/11/2013    Priority: Medium   Osteoarthritis 04/28/2011    Priority: Medium   Vitamin D deficiency 03/20/2017    Priority: Low   Allergic rhinitis 11/25/2009    Priority: Low   Osteoporosis screening 11/16/2018    Normal bone density 2020; recheck [place] Maintenance  Topic Date Due   Zoster Vaccines- Shingrix (1 of 2) Never done   INFLUENZA VACCINE  09/29/2020   COVID-19 Vaccine (1) 02/28/2021 (Originally 09/15/1960)   MAMMOGRAM  11/13/2020   OPHTHALMOLOGY EXAM  12/18/2020   HEMOGLOBIN A1C  04/23/2021   FOOT EXAM  10/21/2021   URINE MICROALBUMIN  10/21/2021   DEXA SCAN  11/10/2023   TETANUS/TDAP  10/18/2024   COLONOSCOPY (Pts 45-30yr Insurance coverage will need to be confirmed)  09/27/2029    Hepatitis C Screening  Completed   PNA vac Low Risk Adult  Completed   HPV VACCINES  Aged Out   Immunization History  Administered Date(s) Administered   DTaP 04/27/2006   Hepatitis B, ped/adol 04/28/1991   Influenza Split 12/26/2007   Influenza, Quadrivalent, Recombinant, Inj, Pf 01/22/2013   Influenza, Seasonal, Injecte, Preservative Fre 12/13/2013, 11/20/2014   Influenza,inj,Quad PF,6+ Mos 12/17/2015, 01/07/2019   Influenza-Unspecified 12/14/2016, 02/08/2020   Pneumococcal Conjugate-13 11/23/2018   Pneumococcal Polysaccharide-23 08/27/2014, 07/04/2020   Tdap 10/19/2014   We updated and reviewed the patient's past history in detail and it is documented below. Allergies: Patient is allergic to morphine and related, statins, dilaudid [hydromorphone], percocet [oxycodone-acetaminophen], and vicodin [hydrocodone-acetaminophen]. Past Medical History Patient  has a past medical history of Arthritis, Hyperlipidemia, Spinal stenosis, Thyroid disease, and Type 2 diabetes mellitus with hyperlipidemia ([place]. Past Surgical History Patient  has a past surgical history that includes Abdominal hysterectomy; Carpal tunnel release (Right); Knee surgery (Left); Tonsillectomy and adenoidectomy; Spine surgery; Cervical fusion; and Lumbar laminectomy. Family History: Patient family history includes Cancer in her father, mother, and sister; Diabetes in her brother, father, and sister; Heart disease in her father; Hyperlipidemia in her father, mother, sister, and sister. Social History:  Patient  reports that she has never smoked. She has never used smokeless tobacco. She reports that she does not drink alcohol and does not use drugs.  Review of Systems: Constitutional: negative for fever or malaise Ophthalmic: negative for photophobia, double vision or loss of vision Cardiovascular: negative for chest pain, dyspnea on exertion, or new LE swelling Respiratory: negative for SOB or persistent  cough Gastrointestinal: negative for abdominal pain, change in bowel habits or melena Genitourinary: negative for dysuria or gross hematuria, no abnormal uterine bleeding or disharge Musculoskeletal: negative for new gait disturbance or muscular weakness Integumentary: negative for new or persistent rashes, no breast lumps Neurological: negative for TIA or stroke symptoms Psychiatric: negative for SI or delusions Allergic/Immunologic: negative for hives  Patient Care Team    Relationship Specialty Notifications Start End  ALeamon Arnt MD PCP - General Family Medicine  12/20/14   CAshok Pall MD Consulting Physician Neurosurgery  02/17/17   TMarygrace Drought MD Consulting Physician Ophthalmology  05/17/17   Madelin Rear, [facility] Pharmacist Pharmacist  11/09/19    Comment: [phone]    Objective  Vitals: BP 126/74   Pulse 80   Temp 97.6 F (36.4 C) (Temporal)   Wt 207 lb 6.4 oz (94.1 kg)   SpO2 96%   BMI 33.48 kg/m  General:  Well developed, well nourished, no acute distress  Psych:  Alert and orientedx3,normal mood and affect HEENT:  Normocephalic, atraumatic, non-icteric sclera,  supple neck without adenopathy, mass or thyromegaly Cardiovascular:  Normal S1, S2, RRR without gallop, rub or murmur Respiratory:  Good breath sounds bilaterally, CTAB with normal respiratory effort Gastrointestinal: normal bowel sounds, soft, non-tender, no noted masses. No HSM MSK: no deformities, contusions. Joints are without erythema or swelling.  Skin:  Warm, no rashes or suspicious lesions noted Neurologic:    Mental status is normal. CN 2-11 are normal. Gross motor and sensory exams are normal. Normal gait. No tremor   Commons side effects, risks, benefits, and alternatives for medications and treatment plan prescribed today were discussed, and the patient expressed understanding of the given instructions. Patient is instructed to call or message via MyChart if he/she has any questions or  concerns regarding our treatment plan. No barriers to understanding were identified. We discussed Red Flag symptoms and signs in detail. Patient expressed understanding regarding what to do in case of urgent or emergency type symptoms.  Medication list was reconciled, printed and provided to the patient in AVS. Patient instructions and summary information was reviewed with the patient as documented in the AVS. This note was prepared with assistance of Dragon voice recognition software. Occasional wrong-word or sound-a-like substitutions may have occurred due to the inherent limitations of voice recognition software  This visit occurred during the SARS-CoV-2 public health emergency.  Safety protocols were in place, including screening questions prior to the visit, additional usage of staff PPE, and extensive cleaning of exam room while observing appropriate contact time as indicated for disinfecting solutions.

## 2020-10-21 NOTE — Patient Instructions (Signed)
Please return in 6 months for diabetes recheck.   I will release your lab results to you on your MyChart account with further instructions. Please reply with any questions.    If you have any questions or concerns, please don't hesitate to send me a message via MyChart or call the office at [phone]. Thank you for visiting with [place] today! It's our pleasure caring for you.

## 2020-10-27 ENCOUNTER — Other Ambulatory Visit: Payer: Self-pay

## 2020-10-27 ENCOUNTER — Encounter: Payer: Self-pay | Admitting: Family Medicine

## 2020-10-28 DIAGNOSIS — Z1211 Encounter for screening for malignant neoplasm of colon: Secondary | ICD-10-CM | POA: Diagnosis not present

## 2020-10-28 DIAGNOSIS — E669 Obesity, unspecified: Secondary | ICD-10-CM | POA: Diagnosis not present

## 2020-10-28 DIAGNOSIS — Z86010 Personal history of colonic polyps: Secondary | ICD-10-CM | POA: Diagnosis not present

## 2020-10-28 DIAGNOSIS — K5730 Diverticulosis of large intestine without perforation or abscess without bleeding: Secondary | ICD-10-CM | POA: Diagnosis not present

## 2020-10-28 DIAGNOSIS — K219 Gastro-esophageal reflux disease without esophagitis: Secondary | ICD-10-CM | POA: Diagnosis not present

## 2020-11-19 DIAGNOSIS — Z1231 Encounter for screening mammogram for malignant neoplasm of breast: Secondary | ICD-10-CM | POA: Diagnosis not present

## 2020-11-19 LAB — HM MAMMOGRAPHY

## 2020-11-25 ENCOUNTER — Encounter: Payer: Self-pay | Admitting: Family Medicine

## 2020-12-10 ENCOUNTER — Telehealth: Payer: Medicare HMO

## 2020-12-11 NOTE — Progress Notes (Signed)
Chronic Care Management Pharmacy Note  12/22/2020 Name:  Holly Riley MRN:  [id number] DOB:  Jun 14, 1955  Summary: PharmD FU visit.  Patient working on getting A1c controlled.  She is against oral metformin if it comes to that due to perceived adverse effects.  Also mentions leg pains caused by Zetia.  Recommendations/Changes made from today's visit: Fu next week on A1c - consider medication if remains elevated  Plan: FU 6 months   Subjective: Holly Riley is an 65 y.o. year old female who is a primary patient of Willow Ora, MD.  The CCM team was consulted for assistance with disease management and care coordination needs.    Engaged with patient by telephone for follow up visit in response to provider referral for pharmacy case management and/or care coordination services.   Consent to Services:  The patient was given the following information about Chronic Care Management services today, agreed to services, and gave verbal consent: 1. CCM service includes personalized support from designated clinical staff supervised by the primary care provider, including individualized plan of care and coordination with other care providers 2. 24/7 contact phone numbers for assistance for urgent and routine care needs. 3. Service will only be billed when office clinical staff spend 20 minutes or more in a month to coordinate care. 4. Only one practitioner may furnish and bill the service in a calendar month. 5.The patient may stop CCM services at any time (effective at the end of the month) by phone call to the office staff. 6. The patient will be responsible for cost sharing (co-pay) of up to 20% of the service fee (after annual deductible is met). Patient agreed to services and consent obtained.  Patient Care Team: Willow Ora, MD as PCP - General (Family Medicine) Coletta Memos, MD as Consulting Physician (Neurosurgery) Janet Berlin, MD as Consulting Physician  (Ophthalmology) Erroll Luna, [facility] (Pharmacist)    Recent office visits:  10/21/20 Holly Riley) - annual physical exam, A1c increased - she declined metformin and prefers to work on diet/exercise Recent consult visits:  None   Hospital visits:  None in previous 6 months   Objective:  Lab Results  Component Value Date   CREATININE 0.86 10/21/2020   BUN 14 10/21/2020   GFR 70.06 10/21/2020   GFRNONAA 64 07/31/2019   GFRAA 74 07/31/2019   NA 140 10/21/2020   K 4.0 10/21/2020   CALCIUM 9.0 10/21/2020   CO2 27 10/21/2020   GLUCOSE 136 (H) 10/21/2020    Lab Results  Component Value Date/Time   HGBA1C 7.2 (H) 10/21/2020 09:37 AM   HGBA1C 6.0 (A) 07/04/2020 08:30 AM   HGBA1C 7.1 (H) 04/04/2020 09:11 AM   HGBA1C 5.8 02/09/2018 08:14 AM   HGBA1C 5.8 10/28/2016 12:00 AM   GFR 70.06 10/21/2020 09:37 AM   GFR 67.11 10/10/2018 10:28 AM   MICROALBUR <0.7 10/21/2020 09:58 AM   MICROALBUR <0.2 10/18/2019 10:14 AM    Last diabetic Eye exam: No results found for: HMDIABEYEEXA  Last diabetic Foot exam: No results found for: HMDIABFOOTEX   Lab Results  Component Value Date   CHOL 175 10/21/2020   HDL 68.00 10/21/2020   LDLCALC 92 10/21/2020   TRIG 74.0 10/21/2020   CHOLHDL 3 10/21/2020    Hepatic Function Latest Ref Rng & Units 10/21/2020 10/18/2019 07/31/2019  Total Protein 6.0 - 8.3 g/dL 6.6 6.9 -  Albumin 3.5 - 5.2 g/dL 3.5 - -  AST 0 - 37 U/L 23 19 17  ALT 0 - 35 U/L 36(H) 24 21  Alk Phosphatase 39 - 117 U/L 88 - 88  Total Bilirubin 0.2 - 1.2 mg/dL 0.3 0.4 -    Lab Results  Component Value Date/Time   TSH 1.37 10/21/2020 09:37 AM   TSH 2.50 04/04/2020 09:11 AM   FREET4 1.17 04/04/2020 09:11 AM    CBC Latest Ref Rng & Units 10/21/2020 04/04/2020 10/18/2019  WBC 4.0 - 10.5 K/uL 5.7 6.8 7.6  Hemoglobin 12.0 - 15.0 g/dL 14.2 14.8 15.8(H)  Hematocrit 36.0 - 46.0 % 42.2 43.1 46.7(H)  Platelets 150.0 - 400.0 K/uL 273.0 237.0 306    Lab Results  Component Value Date/Time    VD25OH 42.73 10/21/2020 09:37 AM   VD25OH 45 10/18/2019 10:14 AM   VD25OH 34.35 02/09/2018 08:22 AM    Clinical ASCVD: No  The 10-year ASCVD risk score (Arnett DK, et al., 2019) is: 11.1%   Values used to calculate the score:     Age: 45 years     Sex: Female     Is Non-Hispanic African American: No     Diabetic: Yes     Tobacco smoker: No     Systolic Blood Pressure: 696 mmHg     Is BP treated: No     HDL Cholesterol: 68 mg/dL     Total Cholesterol: 175 mg/dL    Depression screen [facility] 2/9 10/21/2020 05/23/2020 10/18/2019  Decreased Interest 0 0 0  Down, Depressed, Hopeless 0 0 0  PHQ - 2 Score 0 0 0     Social History   Tobacco Use  Smoking Status Never  Smokeless Tobacco Never   BP Readings from Last 3 Encounters:  10/21/20 126/74  07/04/20 130/80  04/04/20 120/70   Pulse Readings from Last 3 Encounters:  10/21/20 80  07/04/20 83  04/04/20 78   Wt Readings from Last 3 Encounters:  10/21/20 207 lb 6.4 oz (94.1 kg)  07/04/20 202 lb 3.2 oz (91.7 kg)  04/04/20 210 lb (95.3 kg)   BMI Readings from Last 3 Encounters:  10/21/20 33.48 kg/m  07/04/20 32.64 kg/m  04/04/20 33.89 kg/m    Assessment/Interventions: Review of patient past medical history, allergies, medications, health status, including review of consultants reports, laboratory and other test data, was performed as part of comprehensive evaluation and provision of chronic care management services.   SDOH:  (Social Determinants of Health) assessments and interventions performed: Yes  Financial Resource Strain: Low Risk    Difficulty of Paying Living Expenses: Not hard at all    SDOH Screenings   Alcohol Screen: Not on file  Depression (PHQ2-9): Low Risk    PHQ-2 Score: 0  Financial Resource Strain: Low Risk    Difficulty of Paying Living Expenses: Not hard at all  Food Insecurity: No Food Insecurity   Worried About Charity fundraiser in the Last Year: Never true   Ran Out of Food in the Last  Year: Never true  Housing: Low Risk    Last Housing Risk Score: 0  Physical Activity: Sufficiently Active   Days of Exercise per Week: 5 days   Minutes of Exercise per Session: 30 min  Social Connections: Moderately Isolated   Frequency of Communication with Friends and Family: More than three times a week   Frequency of Social Gatherings with Friends and Family: More than three times a week   Attends Religious Services: Never   Marine scientist or Organizations: No   Attends Archivist  Meetings: Never   Marital Status: Married  Stress: No Stress Concern Present   Feeling of Stress : Not at all  Tobacco Use: Low Risk    Smoking Tobacco Use: Never   Smokeless Tobacco Use: Never   Passive Exposure: Not on file  Transportation Needs: No Transportation Needs   Lack of Transportation (Medical): No   Lack of Transportation (Non-Medical): No    CCM Care Plan  Allergies  Allergen Reactions   Morphine And Related Other (See Comments)    hallucinations hallucinations   Statins Other (See Comments)    myalgis   Dilaudid [Hydromorphone]     hallucentations   Percocet [Oxycodone-Acetaminophen] Itching and Other (See Comments)    tachycardia   Vicodin [Hydrocodone-Acetaminophen]     Medications Reviewed Today     Reviewed by Edythe Clarity, [facility] (Pharmacist) on 12/22/20 at [place] List Status: <None>   Medication Order Taking? Sig Documenting Provider Last Dose Status Informant  acetaminophen (TYLENOL) 500 MG tablet [id number] No Take 500 mg by mouth every 6 (six) hours as needed. [provider] Taking Active Self  Barberry-Oreg Grape-Goldenseal (BERBERINE COMPLEX PO) [id number] No  [provider] Taking Active   Cholecalciferol (VITAMIN D3) 50 MCG (2000 UT) capsule [id number] No Take 2,000 Units by mouth daily.  [provider] Taking Active   Coenzyme Q10 (CO Q 10 PO) [id number] No  [provider] Taking Active   ezetimibe  (ZETIA) 10 MG tablet [id number] No TAKE 1 TABLET(10 MG) BY MOUTH DAILY Leamon Arnt, MD Taking Active   Flaxseed, Linseed, (FLAX SEED OIL PO) [id number] No Take 1 tablet by mouth daily. [provider] Taking Active Self  Garlic 10 MG CAPS [id number] No Take 10 mg by mouth daily. [provider] Taking Active Self  levothyroxine (SYNTHROID) 88 MCG tablet [id number] No Take 1 tablet (88 mcg total) by mouth daily before breakfast. Leamon Arnt, MD Taking Active   loratadine (CLARITIN) 10 MG tablet [id number] No Take by mouth. [provider] Taking Active   Magnesium 250 MG TABS [id number] No Take 1 tablet by mouth daily.  [provider] Taking Active   Omega-3 Fatty Acids (FISH OIL CONCENTRATE) 1000 MG CAPS [id number] No Take 1,000 mg by mouth daily. [provider] Taking Active Self  Red Yeast Rice 600 MG TABS [id number] No  [provider] Taking Active   Zinc Sulfate (ZINC 15 PO) [id number] No Take by mouth. [provider] Taking Active             Patient Active Problem List   Diagnosis Date Noted   Osteoporosis screening 11/16/2018   Statin intolerance 03/20/2017   Vitamin D deficiency 03/20/2017   Controlled type 2 diabetes mellitus without complication, without long-term current use of insulin ([place]) 02/17/2017   Combined hyperlipidemia associated with type 2 diabetes mellitus ([place]) 02/17/2017   Spinal stenosis    Insomnia 09/11/2013   Hypothyroidism 03/26/2013   Obesity (BMI 30.0-34.9) 01/22/2013   Osteoarthritis 04/28/2011   Allergic rhinitis 11/25/2009    Immunization History  Administered Date(s) Administered   DTaP 04/27/2006   Hepatitis B, ped/adol 04/28/1991   Influenza Split 12/26/2007   Influenza, Quadrivalent, Recombinant, Inj, Pf 01/22/2013   Influenza, Seasonal, Injecte, Preservative Fre 12/13/2013, 11/20/2014   Influenza,inj,Quad PF,6+ Mos 12/17/2015, 01/07/2019   Influenza-Unspecified 12/14/2016,  02/08/2020   Pneumococcal Conjugate-13 11/23/2018   Pneumococcal Polysaccharide-23 08/27/2014, 07/04/2020   Tdap 10/19/2014    Conditions to be addressed/monitored:  Hypothyroidism,  Type II DM, HLD, Osteoporosis  Care Plan : General Pharmacy (Adult)  Updates made by Edythe Clarity, RPH since 12/22/2020 12:00 AM     Problem: Hypothyroidism, Type II DM, HLD, Osteoporosis   Priority: High  Onset Date: 06/22/2021  Note:   Current Barriers:  Unable to achieve control of glucose   Pharmacist Clinical Goal(s):  Patient will achieve control of glucose as evidenced by A1c through collaboration with PharmD and provider.   Interventions: 1:1 collaboration with Leamon Arnt, MD regarding development and update of comprehensive plan of care as evidenced by provider attestation and co-signature Inter-disciplinary care team collaboration (see longitudinal plan of care) Comprehensive medication review performed; medication list updated in electronic medical record  Hyperlipidemia: (LDL goal < 100) -Controlled -Current treatment: Zetia 10mg  -Medications previously tried: statins (mylgias)  -Current dietary patterns: she is limiting her carbs, not drinking sodas, trying to make healthier choices -Current exercise habits: minimal, rides bikes when the weather is [place] -Educated on Cholesterol goals;  Importance of limiting foods high in cholesterol; -She mentions leg pain with Zetia similar to statins - currently still taking at this time, plans to discuss with Dr. Jonni Sanger. -Recommended to continue current medication  Diabetes (A1c goal <7%) -Uncontrolled -Current medications: None -Medications previously tried: none  -Current home glucose readings fasting glucose: N/A post prandial glucose: N/A -Denies hypoglycemic/hyperglycemic symptoms -Current exercise: minimal -Educated on A1c and blood sugar goals; Exercise goal of 150 minutes per week; Carbohydrate counting and/or plate  method -Counseled to check feet daily and get yearly eye exams -Has upcoming 3 month follow up with Dr. Jonni Sanger for A1c recheck. She is working on dietary improvements.  She is not interested in oral metformin at this time.  If she starts meds she wants metformin compounded due to perceived adverse effects. Recommend recheck A1c.   Could also consider Jardiance 10mg  daily for patient if she is opposed to metformin.  Hypothyroidism (Goal: Maintian TSh) -Controlled -Current treatment  Levothyroxine 69mcg -Medications previously tried: none noted  -Takes medication appropriately, TSH controlled No changes to meds at this time  Patient Goals/Self-Care Activities Patient will:  - take medications as prescribed target a minimum of 150 minutes of moderate intensity exercise weekly engage in dietary modifications by limiting carbohydrates and excess sugars.  Follow Up Plan: The care management team will reach out to the patient again over the next 180 days.       Medication Assistance: None required.  Patient affirms current coverage meets needs.  Compliance/Adherence/Medication fill history: Care Gaps: Eye Exam  Star-Rating Drugs: N/A  Patient's preferred pharmacy is:  [id number] [address], [address] AT [address] [place] [address] Phone: [phone] Fax: [phone]  We discussed: Benefits of medication synchronization, packaging and delivery as well as enhanced pharmacist oversight with [employer]. Patient decided to: Continue current medication management strategy  Care Plan and Follow Up Patient Decision:  Patient agrees to Care Plan and Follow-up.  Plan: The care management team will reach out to the patient again over the next 180 days.  Beverly Milch, PharmD Clinical Pharmacist [phone]

## 2020-12-22 ENCOUNTER — Ambulatory Visit (INDEPENDENT_AMBULATORY_CARE_PROVIDER_SITE_OTHER): Payer: Medicare HMO | Admitting: Pharmacist

## 2020-12-22 DIAGNOSIS — E119 Type 2 diabetes mellitus without complications: Secondary | ICD-10-CM

## 2020-12-22 DIAGNOSIS — E1169 Type 2 diabetes mellitus with other specified complication: Secondary | ICD-10-CM

## 2020-12-22 NOTE — Patient Instructions (Addendum)
Visit Information   Goals Addressed             This Visit's Progress    Patient Stated   On track    Get A1C down        Patient Care Plan: General Pharmacy (Adult)     Problem Identified: Hypothyroidism, Type II DM, HLD, Osteoporosis   Priority: High  Onset Date: 06/22/2021  Note:   Current Barriers:  Unable to achieve control of glucose   Pharmacist Clinical Goal(s):  Patient will achieve control of glucose as evidenced by A1c through collaboration with PharmD and provider.   Interventions: 1:1 collaboration with Leamon Arnt, MD regarding development and update of comprehensive plan of care as evidenced by provider attestation and co-signature Inter-disciplinary care team collaboration (see longitudinal plan of care) Comprehensive medication review performed; medication list updated in electronic medical record  Hyperlipidemia: (LDL goal < 100) -Controlled -Current treatment: Zetia 10mg  -Medications previously tried: statins (mylgias)  -Current dietary patterns: she is limiting her carbs, not drinking sodas, trying to make healthier choices -Current exercise habits: minimal, rides bikes when the weather is [place] -Educated on Cholesterol goals;  Importance of limiting foods high in cholesterol; -She mentions leg pain with Zetia similar to statins - currently still taking at this time, plans to discuss with Dr. Jonni Sanger. -Recommended to continue current medication  Diabetes (A1c goal <7%) -Uncontrolled -Current medications: None -Medications previously tried: none  -Current home glucose readings fasting glucose: N/A post prandial glucose: N/A -Denies hypoglycemic/hyperglycemic symptoms -Current exercise: minimal -Educated on A1c and blood sugar goals; Exercise goal of 150 minutes per week; Carbohydrate counting and/or plate method -Counseled to check feet daily and get yearly eye exams -Has upcoming 3 month follow up with Dr. Jonni Sanger for A1c recheck. She is  working on dietary improvements.  She is not interested in oral metformin at this time.  If she starts meds she wants metformin compounded due to perceived adverse effects. Recommend recheck A1c.   Could also consider Jardiance 10mg  daily for patient if she is opposed to metformin.  Hypothyroidism (Goal: Maintian TSh) -Controlled -Current treatment  Levothyroxine 75mcg -Medications previously tried: none noted  -Takes medication appropriately, TSH controlled No changes to meds at this time  Patient Goals/Self-Care Activities Patient will:  - take medications as prescribed target a minimum of 150 minutes of moderate intensity exercise weekly engage in dietary modifications by limiting carbohydrates and excess sugars.  Follow Up Plan: The care management team will reach out to the patient again over the next 180 days.      Long-Range Goal: Patient-Specific Goal   Start Date: 12/22/2020  Expected End Date: 06/22/2021  This Visit's Progress: On track  Priority: High  Note:     Current Barriers:  Unable to achieve control of glucose   Pharmacist Clinical Goal(s):  Patient will achieve control of glucose as evidenced by A1c through collaboration with PharmD and provider.   Interventions: 1:1 collaboration with Leamon Arnt, MD regarding development and update of comprehensive plan of care as evidenced by provider attestation and co-signature Inter-disciplinary care team collaboration (see longitudinal plan of care) Comprehensive medication review performed; medication list updated in electronic medical record  Hyperlipidemia: (LDL goal < 100) -Controlled -Current treatment: Zetia 10mg  -Medications previously tried: statins (mylgias)  -Current dietary patterns: she is limiting her carbs, not drinking sodas, trying to make healthier choices -Current exercise habits: minimal, rides bikes when the weather is [place] -Educated on Cholesterol goals;  Importance of  limiting foods  high in cholesterol; -She mentions leg pain with Zetia similar to statins - currently still taking at this time, plans to discuss with Dr. Jonni Sanger. -Recommended to continue current medication  Diabetes (A1c goal <7%) -Uncontrolled -Current medications: None -Medications previously tried: none  -Current home glucose readings fasting glucose: N/A post prandial glucose: N/A -Denies hypoglycemic/hyperglycemic symptoms -Current exercise: minimal -Educated on A1c and blood sugar goals; Exercise goal of 150 minutes per week; Carbohydrate counting and/or plate method -Counseled to check feet daily and get yearly eye exams -Has upcoming 3 month follow up with Dr. Jonni Sanger for A1c recheck. She is working on dietary improvements.  She is not interested in oral metformin at this time.  If she starts meds she wants metformin compounded due to perceived adverse effects. Recommend recheck A1c.   Could also consider Jardiance 10mg  daily for patient if she is opposed to metformin.  Hypothyroidism (Goal: Maintian TSh) -Controlled -Current treatment  Levothyroxine 7mcg -Medications previously tried: none noted  -Takes medication appropriately, TSH controlled No changes to meds at this time  Patient Goals/Self-Care Activities Patient will:  - take medications as prescribed target a minimum of 150 minutes of moderate intensity exercise weekly engage in dietary modifications by limiting carbohydrates and excess sugars.  Follow Up Plan: The care management team will reach out to the patient again over the next 180 days.        Patient verbalizes understanding of instructions provided today and agrees to view in [place].  Telephone follow up appointment with pharmacy team member scheduled for: 6 months  Edythe Clarity, [place]

## 2020-12-24 DIAGNOSIS — H11001 Unspecified pterygium of right eye: Secondary | ICD-10-CM | POA: Diagnosis not present

## 2020-12-24 DIAGNOSIS — H524 Presbyopia: Secondary | ICD-10-CM | POA: Diagnosis not present

## 2020-12-24 DIAGNOSIS — H2513 Age-related nuclear cataract, bilateral: Secondary | ICD-10-CM | POA: Diagnosis not present

## 2020-12-24 DIAGNOSIS — E119 Type 2 diabetes mellitus without complications: Secondary | ICD-10-CM | POA: Diagnosis not present

## 2020-12-29 DIAGNOSIS — E782 Mixed hyperlipidemia: Secondary | ICD-10-CM | POA: Diagnosis not present

## 2020-12-29 DIAGNOSIS — E119 Type 2 diabetes mellitus without complications: Secondary | ICD-10-CM | POA: Diagnosis not present

## 2020-12-29 DIAGNOSIS — E1169 Type 2 diabetes mellitus with other specified complication: Secondary | ICD-10-CM | POA: Diagnosis not present

## 2020-12-31 ENCOUNTER — Other Ambulatory Visit: Payer: Self-pay

## 2020-12-31 ENCOUNTER — Encounter: Payer: Self-pay | Admitting: Family Medicine

## 2020-12-31 ENCOUNTER — Ambulatory Visit (INDEPENDENT_AMBULATORY_CARE_PROVIDER_SITE_OTHER): Payer: Medicare HMO | Admitting: Family Medicine

## 2020-12-31 VITALS — BP 128/80 | HR 83 | Temp 98.1°F | Ht 66.0 in | Wt 205.8 lb

## 2020-12-31 DIAGNOSIS — R748 Abnormal levels of other serum enzymes: Secondary | ICD-10-CM | POA: Diagnosis not present

## 2020-12-31 DIAGNOSIS — E119 Type 2 diabetes mellitus without complications: Secondary | ICD-10-CM

## 2020-12-31 LAB — COMPREHENSIVE METABOLIC PANEL
ALT: 37 U/L — ABNORMAL HIGH (ref 0–35)
AST: 28 U/L (ref 0–37)
Albumin: 3.8 g/dL (ref 3.5–5.2)
Alkaline Phosphatase: 96 U/L (ref 39–117)
BUN: 22 mg/dL (ref 6–23)
CO2: 29 mEq/L (ref 19–32)
Calcium: 9.5 mg/dL (ref 8.4–10.5)
Chloride: 102 mEq/L (ref 96–112)
Creatinine, Ser: 0.99 mg/dL (ref 0.40–1.20)
GFR: 59.09 mL/min — ABNORMAL LOW (ref >60.00)
Glucose, Bld: 126 mg/dL — ABNORMAL HIGH (ref 70–99)
Potassium: 3.9 mEq/L (ref 3.5–5.1)
Sodium: 139 mEq/L (ref 135–145)
Total Bilirubin: 0.3 mg/dL (ref 0.2–1.2)
Total Protein: 6.9 g/dL (ref 6.0–8.3)

## 2020-12-31 LAB — POCT GLYCOSYLATED HEMOGLOBIN (HGB A1C): Hemoglobin A1C: 6.3 % — AB (ref 4.0–5.6)

## 2020-12-31 NOTE — Patient Instructions (Signed)
Please return in 6 months to recheck diabetes and blood pressure.  I will release your lab results to you on your MyChart account with further instructions. Please reply with any questions.     If you have any questions or concerns, please don't hesitate to send me a message via MyChart or call the office at [phone]. Thank you for visiting with [place] today! It's our pleasure caring for you.

## 2020-12-31 NOTE — Progress Notes (Signed)
Subjective  CC:  Chief Complaint  Patient presents with   Diabetes   Hyperlipidemia   Hypothyroidism    HPI: Holly Riley is a 65 y.o. female who presents to the office today for follow up of diabetes and problems listed above in the chief complaint.  Diabetes follow up: She reports that her diet remains good.  She does not check her home sugars.  She prefers to stay off medications if possible.  She denies exertional CP or SOB or symptomatic hypoglycemia. She denies foot sores or paresthesias.  Very mild elevation of lfts on labs 3 months ago. Here for recheck. No pain. No h/o same. Neg hep c screen in past. No other work up done. No statin.  Wt Readings from Last 3 Encounters:  12/31/20 205 lb 12.8 oz (93.4 kg)  10/21/20 207 lb 6.4 oz (94.1 kg)  07/04/20 202 lb 3.2 oz (91.7 kg)    BP Readings from Last 3 Encounters:  12/31/20 128/80  10/21/20 126/74  07/04/20 130/80    Assessment  1. Controlled type 2 diabetes mellitus without complication, without long-term current use of insulin (HCC)   2. Elevated liver enzymes      Plan  Diabetes is currently well controlled.  Improved control with diet changes.  Possibly stressors increased her hypoglycemic episodes as well.  We will recheck in 3 months. Recheck liver enzymes.  Very minimal elevations in last reading  Follow up: 3 months recheck diabetes. Orders Placed This Encounter  Procedures   Comprehensive metabolic panel   POCT HgB A1C   No orders of the defined types were placed in this encounter.     Immunization History  Administered Date(s) Administered   DTaP 04/27/2006   Fluad Quad(high Dose 65+) 12/29/2020   Hepatitis B, ped/adol 04/28/1991   Influenza Split 12/26/2007   Influenza, Quadrivalent, Recombinant, Inj, Pf 01/22/2013   Influenza, Seasonal, Injecte, Preservative Fre 12/13/2013, 11/20/2014   Influenza,inj,Quad PF,6+ Mos 12/17/2015, 01/07/2019   Influenza-Unspecified 12/14/2016, 02/08/2020    Pneumococcal Conjugate-13 11/23/2018   Pneumococcal Polysaccharide-23 08/27/2014, 07/04/2020   Tdap 10/19/2014   Zoster Recombinat (Shingrix) 11/25/2020    Diabetes Related Lab Review: Lab Results  Component Value Date   HGBA1C 7.2 (H) 10/21/2020   HGBA1C 6.0 (A) 07/04/2020   HGBA1C 7.1 (H) 04/04/2020    Lab Results  Component Value Date   MICROALBUR <0.7 10/21/2020   Lab Results  Component Value Date   CREATININE 0.86 10/21/2020   BUN 14 10/21/2020   NA 140 10/21/2020   K 4.0 10/21/2020   CL 105 10/21/2020   CO2 27 10/21/2020   Lab Results  Component Value Date   CHOL 175 10/21/2020   CHOL 214 (H) 10/18/2019   CHOL 228 (H) 10/10/2018   Lab Results  Component Value Date   HDL 68.00 10/21/2020   HDL 79 10/18/2019   HDL 73.10 10/10/2018   Lab Results  Component Value Date   LDLCALC 92 10/21/2020   LDLCALC 115 (H) 10/18/2019   LDLCALC 137 (H) 10/10/2018   Lab Results  Component Value Date   TRIG 74.0 10/21/2020   TRIG 92 10/18/2019   TRIG 91.0 10/10/2018   Lab Results  Component Value Date   CHOLHDL 3 10/21/2020   CHOLHDL 2.7 10/18/2019   CHOLHDL 3 10/10/2018   No results found for: LDLDIRECT The 10-year ASCVD risk score (Arnett DK, et al., 2019) is: 11.4%   Values used to calculate the score:     Age: 59  years     Sex: Female     Is Non-Hispanic African American: No     Diabetic: Yes     Tobacco smoker: No     Systolic Blood Pressure: 700 mmHg     Is BP treated: No     HDL Cholesterol: 68 mg/dL     Total Cholesterol: 175 mg/dL I have reviewed the PMH, Fam and Soc history. Patient Active Problem List   Diagnosis Date Noted   Statin intolerance 03/20/2017    Priority: High    Myalgias, simvistatin and crestor    Controlled type 2 diabetes mellitus without complication, without long-term current use of insulin ([place]) 02/17/2017    Priority: High   Combined hyperlipidemia associated with type 2 diabetes mellitus ([place]) 02/17/2017    Priority: High    Spinal stenosis     Priority: High    NS - early, MRI    Hypothyroidism 03/26/2013    Priority: High   Obesity (BMI 30.0-34.9) 01/22/2013    Priority: High   Insomnia 09/11/2013    Priority: Medium    Osteoarthritis 04/28/2011    Priority: Medium    Vitamin D deficiency 03/20/2017    Priority: Low   Allergic rhinitis 11/25/2009    Priority: Low   Osteoporosis screening 11/16/2018    Normal bone density 2020; recheck 2025     Social History: Patient  reports that she has never smoked. She has never used smokeless tobacco. She reports that she does not drink alcohol and does not use drugs.  Review of Systems: Ophthalmic: negative for eye pain, loss of vision or double vision Cardiovascular: negative for chest pain Respiratory: negative for SOB or persistent cough Gastrointestinal: negative for abdominal pain Genitourinary: negative for dysuria or gross hematuria MSK: negative for foot lesions Neurologic: negative for weakness or gait disturbance  Objective  Vitals: BP 128/80   Pulse 83   Temp 98.1 F (36.7 C) (Temporal)   Ht 5\' 6"  (1.676 m)   Wt 205 lb 12.8 oz (93.4 kg)   SpO2 98%   BMI 33.22 kg/m  General: well appearing, no acute distress  Cardiovascular:  Nl S1 and S2, RRR without murmur, gallop or rub. no edema Respiratory:  Good breath sounds bilaterally, CTAB with normal effort, no rales Gastrointestinal: normal BS, soft, nontender  Lab Results  Component Value Date   ALT 36 (H) 10/21/2020   AST 23 10/21/2020   ALKPHOS 88 10/21/2020   BILITOT 0.3 10/21/2020     Diabetic education: ongoing education regarding chronic disease management for diabetes was given today. We continue to reinforce the ABC's of diabetic management: A1c (<7 or 8 dependent upon patient), tight blood pressure control, and cholesterol management with goal LDL < 100 minimally. We discuss diet strategies, exercise recommendations, medication options and possible side effects. At each  visit, we review recommended immunizations and preventive care recommendations for diabetics and stress that good diabetic control can prevent other problems. See below for this patient's data.   Commons side effects, risks, benefits, and alternatives for medications and treatment plan prescribed today were discussed, and the patient expressed understanding of the given instructions. Patient is instructed to call or message via MyChart if he/she has any questions or concerns regarding our treatment plan. No barriers to understanding were identified. We discussed Red Flag symptoms and signs in detail. Patient expressed understanding regarding what to do in case of urgent or emergency type symptoms.  Medication list was reconciled, printed and provided to the  patient in AVS. Patient instructions and summary information was reviewed with the patient as documented in the AVS. This note was prepared with assistance of Dragon voice recognition software. Occasional wrong-word or sound-a-like substitutions may have occurred due to the inherent limitations of voice recognition software  This visit occurred during the SARS-CoV-2 public health emergency.  Safety protocols were in place, including screening questions prior to the visit, additional usage of staff PPE, and extensive cleaning of exam room while observing appropriate contact time as indicated for disinfecting solutions.

## 2021-01-26 DIAGNOSIS — K5730 Diverticulosis of large intestine without perforation or abscess without bleeding: Secondary | ICD-10-CM | POA: Diagnosis not present

## 2021-01-26 DIAGNOSIS — Z86010 Personal history of colonic polyps: Secondary | ICD-10-CM | POA: Diagnosis not present

## 2021-02-12 ENCOUNTER — Telehealth: Payer: Self-pay | Admitting: Pharmacist

## 2021-02-12 NOTE — Chronic Care Management (AMB) (Signed)
Chronic Care Management Pharmacy Assistant   Name: Holly Riley  MRN: [id number] DOB: 11-18-1955   Reason for Encounter: Diabetes Adherence Call    Recent office visits:  12/31/2020 [facility] (PCP) Leamon Arnt, MD; no medication changes indicated.  Recent consult visits:  None  Hospital visits:  None in previous 6 months  Medications: Outpatient Encounter Medications as of 02/12/2021  Medication Sig   acetaminophen (TYLENOL) 500 MG tablet Take 500 mg by mouth every 6 (six) hours as needed. (Patient not taking: Reported on 12/31/2020)   Biotin 5000 MCG CAPS Take by mouth.   Cholecalciferol (VITAMIN D3) 50 MCG (2000 UT) capsule Take 2,000 Units by mouth daily.    Coenzyme Q10 (CO Q 10 PO)    ezetimibe (ZETIA) 10 MG tablet TAKE 1 TABLET(10 MG) BY MOUTH DAILY   Flaxseed, Linseed, (FLAX SEED OIL PO) Take 1 tablet by mouth daily.   Garlic 10 MG CAPS Take 10 mg by mouth daily.   levothyroxine (SYNTHROID) 88 MCG tablet Take 1 tablet (88 mcg total) by mouth daily before breakfast.   loratadine (CLARITIN) 10 MG tablet Take by mouth.   Magnesium 250 MG TABS Take 1 tablet by mouth daily.    Omega-3 Fatty Acids (FISH OIL CONCENTRATE) 1000 MG CAPS Take 1,000 mg by mouth daily.   Red Yeast Rice 600 MG TABS    Zinc Sulfate (ZINC 15 PO) Take by mouth.   No facility-administered encounter medications on file as of 02/12/2021.    Recent Relevant Labs: Lab Results  Component Value Date/Time   HGBA1C 6.3 (A) 12/31/2020 01:33 PM   HGBA1C 7.2 (H) 10/21/2020 09:37 AM   HGBA1C 6.0 (A) 07/04/2020 08:30 AM   HGBA1C 7.1 (H) 04/04/2020 09:11 AM   HGBA1C 5.8 02/09/2018 08:14 AM   HGBA1C 5.8 10/28/2016 12:00 AM   MICROALBUR <0.7 10/21/2020 09:58 AM   MICROALBUR <0.2 10/18/2019 10:14 AM    Kidney Function Lab Results  Component Value Date/Time   CREATININE 0.99 12/31/2020 01:47 PM   CREATININE 0.86 10/21/2020 09:37 AM   CREATININE 0.92 10/18/2019 10:14 AM   GFR 59.09 (L) 12/31/2020 01:47 PM    GFRNONAA 64 07/31/2019 12:00 AM   GFRAA 74 07/31/2019 12:00 AM    Current antihyperglycemic regimen:  None  What recent interventions/DTPs have been made to improve glycemic control:  No recent interventions or DTPs.  Have there been any recent hospitalizations or ED visits since last visit with CPP? No  Patient denies hypoglycemic symptoms.  Patient denies hyperglycemic symptoms.  Are you checking your feet daily/regularly? Yes, patient states she checks her feet regularly.  Adherence Review: Is the patient currently on a STATIN medication? No Is the patient currently on ACE/ARB medication? No Does the patient have >5 day gap between last estimated fill dates? No  Patient states Zetia was discontinued due to it causing her to have pains in her legs. Patient states she does not check her blood sugars at home because her A1c has improved with diet and exercise. She does not take any medication for diabetes because Metformin "is a bad drug". 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  Care Gaps: Medicare Annual Wellness: Completed 04/2020 Ophthalmology Exam: Overdue since 12/18/2020 Foot Exam: Next due on 10/21/2021 Hemoglobin A1C: 6.3% on 12/31/2020 Colonoscopy: Next due on 09/27/2029 Dexa Scan: Next due on 11/10/2023 Mammogram: Next due on 11/19/2021  Future Appointments  Date Time Provider [place]  05/29/2021  8:45 AM [facility] HEALTH COACH [facility]  06/30/2021 10:30 AM Leamon Arnt, MD [facility]  06/30/2021  2:00 PM [facility] CCM PHARMACIST [facility]   Star Rating Drugs: None  April D Calhoun, [place] Pharmacist Assistant [phone]

## 2021-05-18 ENCOUNTER — Ambulatory Visit: Payer: Medicare HMO | Admitting: Family Medicine

## 2021-05-29 ENCOUNTER — Ambulatory Visit (INDEPENDENT_AMBULATORY_CARE_PROVIDER_SITE_OTHER): Payer: Medicare HMO

## 2021-05-29 DIAGNOSIS — Z0000 Encounter for general adult medical examination without abnormal findings: Secondary | ICD-10-CM

## 2021-05-29 NOTE — Patient Instructions (Signed)
Ms. Wernli , ?Thank you for taking time to come for your Medicare Wellness Visit. I appreciate your ongoing commitment to your health goals. Please review the following plan we discussed and let me know if I can assist you in the future.  ? ?Screening recommendations/referrals: ?Colonoscopy: Done 09/28/19 repeat every 10 years  ?Mammogram: Done 11/19/20 repeat every year  ?Bone Density: Done 11/10/18 repeat every 5 years  ?Recommended yearly ophthalmology/optometry visit for glaucoma screening and checkup ?Recommended yearly dental visit for hygiene and checkup ? ?Vaccinations: ?Influenza vaccine: Done 12/29/20 repeat every year  ?Pneumococcal vaccine: Up to date ?Tdap vaccine: Done 10/19/14 repeat every 10 years  ?Shingles vaccine: Completed 11/25/20 & 05/12/21   ?Covid-19:Declined  ? ?Advanced directives: Advance directive discussed with you today. Even though you declined this today please call our office should you change your mind and we can give you the proper paperwork for you to fill out. ? ?Conditions/risks identified: Lose weight  ? ?Next appointment: Follow up in one year for your annual wellness visit  ? ? ?Preventive Care 30 Years and Older, Female ?Preventive care refers to lifestyle choices and visits with your health care provider that can promote health and wellness. ?What does preventive care include? ?A yearly physical exam. This is also called an annual well check. ?Dental exams once or twice a year. ?Routine eye exams. Ask your health care provider how often you should have your eyes checked. ?Personal lifestyle choices, including: ?Daily care of your teeth and gums. ?Regular physical activity. ?Eating a healthy diet. ?Avoiding tobacco and drug use. ?Limiting alcohol use. ?Practicing safe sex. ?Taking low-dose aspirin every day. ?Taking vitamin and mineral supplements as recommended by your health care provider. ?What happens during an annual well check? ?The services and screenings done by your  health care provider during your annual well check will depend on your age, overall health, lifestyle risk factors, and family history of disease. ?Counseling  ?Your health care provider may ask you questions about your: ?Alcohol use. ?Tobacco use. ?Drug use. ?Emotional well-being. ?Home and relationship well-being. ?Sexual activity. ?Eating habits. ?History of falls. ?Memory and ability to understand (cognition). ?Work and work Statistician. ?Reproductive health. ?Screening  ?You may have the following tests or measurements: ?Height, weight, and BMI. ?Blood pressure. ?Lipid and cholesterol levels. These may be checked every 5 years, or more frequently if you are over 42 years old. ?Skin check. ?Lung cancer screening. You may have this screening every year starting at age 6 if you have a 30-pack-year history of smoking and currently smoke or have quit within the past 15 years. ?Fecal occult blood test (FOBT) of the stool. You may have this test every year starting at age 26. ?Flexible sigmoidoscopy or colonoscopy. You may have a sigmoidoscopy every 5 years or a colonoscopy every 10 years starting at age 59. ?Hepatitis C blood test. ?Hepatitis B blood test. ?Sexually transmitted disease (STD) testing. ?Diabetes screening. This is done by checking your blood sugar (glucose) after you have not eaten for a while (fasting). You may have this done every 1-3 years. ?Bone density scan. This is done to screen for osteoporosis. You may have this done starting at age 32. ?Mammogram. This may be done every 1-2 years. Talk to your health care provider about how often you should have regular mammograms. ?Talk with your health care provider about your test results, treatment options, and if necessary, the need for more tests. ?Vaccines  ?Your health care provider may recommend certain vaccines, such  as: ?Influenza vaccine. This is recommended every year. ?Tetanus, diphtheria, and acellular pertussis (Tdap, Td) vaccine. You may  need a Td booster every 10 years. ?Zoster vaccine. You may need this after age 70. ?Pneumococcal 13-valent conjugate (PCV13) vaccine. One dose is recommended after age 62. ?Pneumococcal polysaccharide (PPSV23) vaccine. One dose is recommended after age 31. ?Talk to your health care provider about which screenings and vaccines you need and how often you need them. ?This information is not intended to replace advice given to you by your health care provider. Make sure you discuss any questions you have with your health care provider. ?Document Released: 03/14/2015 Document Revised: 11/05/2015 Document Reviewed: 12/17/2014 ?Elsevier Interactive Patient Education ? 2017 [place]. ? ?Fall Prevention in the Home ?Falls can cause injuries. They can happen to people of all ages. There are many things you can do to make your home safe and to help prevent falls. ?What can I do on the outside of my home? ?Regularly fix the edges of walkways and driveways and fix any cracks. ?Remove anything that might make you trip as you walk through a door, such as a raised step or threshold. ?Trim any bushes or trees on the path to your home. ?Use bright outdoor lighting. ?Clear any walking paths of anything that might make someone trip, such as rocks or tools. ?Regularly check to see if handrails are loose or broken. Make sure that both sides of any steps have handrails. ?Any raised decks and porches should have guardrails on the edges. ?Have any leaves, snow, or ice cleared regularly. ?Use sand or salt on walking paths during winter. ?Clean up any spills in your garage right away. This includes oil or grease spills. ?What can I do in the bathroom? ?Use night lights. ?Install grab bars by the toilet and in the tub and shower. Do not use towel bars as grab bars. ?Use non-skid mats or decals in the tub or shower. ?If you need to sit down in the shower, use a plastic, non-slip stool. ?Keep the floor dry. Clean up any water that spills on  the floor as soon as it happens. ?Remove soap buildup in the tub or shower regularly. ?Attach bath mats securely with double-sided non-slip rug tape. ?Do not have throw rugs and other things on the floor that can make you trip. ?What can I do in the bedroom? ?Use night lights. ?Make sure that you have a light by your bed that is easy to reach. ?Do not use any sheets or blankets that are too big for your bed. They should not hang down onto the floor. ?Have a firm chair that has side arms. You can use this for support while you get dressed. ?Do not have throw rugs and other things on the floor that can make you trip. ?What can I do in the kitchen? ?Clean up any spills right away. ?Avoid walking on wet floors. ?Keep items that you use a lot in easy-to-reach places. ?If you need to reach something above you, use a strong step stool that has a grab bar. ?Keep electrical cords out of the way. ?Do not use floor polish or wax that makes floors slippery. If you must use wax, use non-skid floor wax. ?Do not have throw rugs and other things on the floor that can make you trip. ?What can I do with my stairs? ?Do not leave any items on the stairs. ?Make sure that there are handrails on both sides of the stairs and use  them. Fix handrails that are broken or loose. Make sure that handrails are as long as the stairways. ?Check any carpeting to make sure that it is firmly attached to the stairs. Fix any carpet that is loose or worn. ?Avoid having throw rugs at the top or bottom of the stairs. If you do have throw rugs, attach them to the floor with carpet tape. ?Make sure that you have a light switch at the top of the stairs and the bottom of the stairs. If you do not have them, ask someone to add them for you. ?What else can I do to help prevent falls? ?Wear shoes that: ?Do not have high heels. ?Have rubber bottoms. ?Are comfortable and fit you well. ?Are closed at the toe. Do not wear sandals. ?If you use a stepladder: ?Make sure  that it is fully opened. Do not climb a closed stepladder. ?Make sure that both sides of the stepladder are locked into place. ?Ask someone to hold it for you, if possible. ?Clearly mark and make sure that yo

## 2021-05-29 NOTE — Progress Notes (Signed)
Virtual Visit via Telephone Note ? ?I connected with  Holly Riley on 05/29/21 at  8:45 AM EDT by telephone and verified that I am speaking with the correct person using two identifiers. ? ?Medicare Annual Wellness visit completed telephonically due to Covid-19 pandemic.  ? ?Persons participating in this call: This Health Coach and this patient.  ? ?Location: ?Patient: Home ?Provider: Office  ?  ?I discussed the limitations, risks, security and privacy concerns of performing an evaluation and management service by telephone and the availability of in person appointments. The patient expressed understanding and agreed to proceed. ? ?Unable to perform video visit due to video visit attempted and failed and/or patient does not have video capability.  ? ?Some vital signs may be absent or patient reported.  ? ?Willette Brace, LPN ? ? ?Subjective:  ? Holly Riley is a 66 y.o. female who presents for Medicare Annual (Subsequent) preventive examination. ? ?Review of Systems    ? ?Cardiac Risk Factors include: advanced age (>54mn, >>79women) ? ?   ?Objective:  ?  ?There were no vitals filed for this visit. ?There is no height or weight on file to calculate BMI. ? ? ?  05/29/2021  ?  8:52 AM 05/23/2020  ?  8:55 AM  ?Advanced Directives  ?Does Patient Have a Medical Advance Directive? No No  ?Would patient like information on creating a medical advance directive? No - Patient declined No - Patient declined  ? ? ?Current Medications (verified) ?Outpatient Encounter Medications as of 05/29/2021  ?Medication Sig  ? Cholecalciferol (VITAMIN D3) 50 MCG (2000 UT) capsule Take 2,000 Units by mouth daily.   ? Coenzyme Q10 (CO Q 10 PO)   ? Flaxseed, Linseed, (FLAX SEED OIL PO) Take 1 tablet by mouth daily.  ? Garlic 10 MG CAPS Take 10 mg by mouth daily.  ? levothyroxine (SYNTHROID) 88 MCG tablet Take 1 tablet (88 mcg total) by mouth daily before breakfast.  ? loratadine (CLARITIN) 10 MG tablet Take by mouth.  ? Magnesium 250 MG  TABS Take 1 tablet by mouth daily.   ? Omega-3 Fatty Acids (FISH OIL CONCENTRATE) 1000 MG CAPS Take 1,000 mg by mouth daily.  ? Red Yeast Rice 600 MG TABS   ? FLUCELVAX QUADRIVALENT 0.5 ML injection   ? [DISCONTINUED] acetaminophen (TYLENOL) 500 MG tablet Take 500 mg by mouth every 6 (six) hours as needed. (Patient not taking: Reported on 12/31/2020)  ? [DISCONTINUED] Biotin 5000 MCG CAPS Take by mouth.  ? [DISCONTINUED] ezetimibe (ZETIA) 10 MG tablet TAKE 1 TABLET(10 MG) BY MOUTH DAILY  ? [DISCONTINUED] Zinc Sulfate (ZINC 15 PO) Take by mouth.  ? ?No facility-administered encounter medications on file as of 05/29/2021.  ? ? ?Allergies (verified) ?Morphine and related, Statins, Dilaudid [hydromorphone], Percocet [oxycodone-acetaminophen], and Vicodin [hydrocodone-acetaminophen]  ? ?History: ?Past Medical History:  ?Diagnosis Date  ? Arthritis   ? Hyperlipidemia   ? Spinal stenosis   ? NS - early, MRI  ? Thyroid disease   ? [place]   ? ?Past Surgical History:  ?Procedure Laterality Date  ? ABDOMINAL HYSTERECTOMY    ? CARPAL TUNNEL RELEASE Right   ? CERVICAL FUSION    ? [place]  ? KNEE SURGERY Left   ? LUMBAR LAMINECTOMY    ? x 2, L4, L5, [place]  ? SPINE SURGERY    ? TONSILLECTOMY AND ADENOIDECTOMY    ? ?Family History  ?Problem Relation Age of Onset  ?  Cancer Mother   ? Hyperlipidemia Mother   ? Cancer Father   ? Diabetes Father   ? Heart disease Father   ? Hyperlipidemia Father   ? Cancer Sister   ? Diabetes Sister   ? Hyperlipidemia Sister   ? Diabetes Brother   ? Hyperlipidemia Sister   ? ?Social History  ? ?Socioeconomic History  ? Marital status: Married  ?  Spouse name: Not on file  ? Number of children: Not on file  ? Years of education: Not on file  ? Highest education level: Not on file  ?Occupational History  ? Occupation: Therapist, sports  ?  Employer: [employer]  ?  Comment: Retired   ?Tobacco Use  ? Smoking status: Never  ? Smokeless tobacco: Never  ?Vaping Use  ? Vaping Use:  Never used  ?Substance and Sexual Activity  ? Alcohol use: No  ? Drug use: No  ? Sexual activity: Never  ?Other Topics Concern  ? Not on file  ?Social History Narrative  ? Not on file  ? ?Social Determinants of Health  ? ?Financial Resource Strain: Low Risk   ? Difficulty of Paying Living Expenses: Not hard at all  ?Food Insecurity: No Food Insecurity  ? Worried About Charity fundraiser in the Last Year: Never true  ? Ran Out of Food in the Last Year: Never true  ?Transportation Needs: No Transportation Needs  ? Lack of Transportation (Medical): No  ? Lack of Transportation (Non-Medical): No  ?Physical Activity: Sufficiently Active  ? Days of Exercise per Week: 5 days  ? Minutes of Exercise per Session: 30 min  ?Stress: No Stress Concern Present  ? Feeling of Stress : Not at all  ?Social Connections: Moderately Isolated  ? Frequency of Communication with Friends and Family: More than three times a week  ? Frequency of Social Gatherings with Friends and Family: More than three times a week  ? Attends Religious Services: Never  ? Active Member of Clubs or Organizations: No  ? Attends Archivist Meetings: Never  ? Marital Status: Married  ? ? ?Tobacco Counseling ?Counseling given: Not Answered ? ? ?Clinical Intake: ? ?Pre-visit preparation completed: Yes ? ?Pain : No/denies pain ? ?  ? ?BMI - recorded: 33.23 ?Nutritional Status: BMI > 30  Obese ?Nutritional Risks: None ?Diabetes: Yes ?CBG done?: No ?Did pt. bring in CBG monitor from home?: No ? ?How often do you need to have someone help you when you read instructions, pamphlets, or other written materials from your doctor or pharmacy?: 1 - Never ? ?Diabetic?Nutrition Risk Assessment: ? ?Has the patient had any N/V/D within the last 2 months?  No  ?Does the patient have any non-healing wounds?  No  ?Has the patient had any unintentional weight loss or weight gain?  No  ? ?Diabetes: ? ?Is the patient diabetic?  Yes  ?If diabetic, was a CBG obtained today?  No   ?Did the patient bring in their glucometer from home?  No  ?How often do you monitor your CBG's? N/A.  ? ?Financial Strains and Diabetes Management: ? ?Are you having any financial strains with the device, your supplies or your medication? No .  ?Does the patient want to be seen by Chronic Care Management for management of their diabetes?  No  ?Would the patient like to be referred to a Nutritionist or for Diabetic Management?  No  ? ?Diabetic Exams: ? ?Diabetic Eye Exam: Completed 12/24/20 ?Diabetic Foot Exam: Completed 10/21/20   ? ?  Interpreter Needed?: No ? ?Information entered by :: Charlott Rakes, LPN ? ? ?Activities of Daily Living ? ?  05/29/2021  ?  8:53 AM  ?In your present state of health, do you have any difficulty performing the following activities:  ?Hearing? 0  ?Vision? 0  ?Difficulty concentrating or making decisions? 0  ?Walking or climbing stairs? 0  ?Dressing or bathing? 0  ?Doing errands, shopping? 0  ?Preparing Food and eating ? N  ?Using the Toilet? N  ?In the past six months, have you accidently leaked urine? N  ?Do you have problems with loss of bowel control? N  ?Managing your Medications? N  ?Managing your Finances? N  ?Housekeeping or managing your Housekeeping? N  ? ? ?Patient Care Team: ?Leamon Arnt, MD as PCP - General (Family Medicine) ?Ashok Pall, MD as Consulting Physician (Neurosurgery) ?Marygrace Drought, MD as Consulting Physician (Ophthalmology) ?Edythe Clarity, [facility] (Pharmacist) ? ?Indicate any recent Medical Services you may have received from other than [facility] providers in the past year (date may be approximate). ? ?   ?Assessment:  ? This is a routine wellness examination for Holly Riley. ? ?Hearing/Vision screen ?Hearing Screening - Comments:: Pt denies any hearing issues  ?Vision Screening - Comments:: Pt follows up with Dr Satira Sark for annual eye exams  ? ?Dietary issues and exercise activities discussed: ?Current Exercise Habits: Home exercise routine, Type of exercise:  walking, Time (Minutes): 30, Frequency (Times/Week): 5, Weekly Exercise (Minutes/Week): 150 ? ? Goals Addressed   ? ?  ?  ?  ?  ? This Visit's Progress  ?  Patient Stated     ?  Lose weight  ?  ? ?  ? ?Tennis Must

## 2021-06-30 ENCOUNTER — Telehealth: Payer: Medicare HMO

## 2021-06-30 ENCOUNTER — Other Ambulatory Visit: Payer: Self-pay

## 2021-06-30 ENCOUNTER — Encounter: Payer: Self-pay | Admitting: Family Medicine

## 2021-06-30 ENCOUNTER — Ambulatory Visit (INDEPENDENT_AMBULATORY_CARE_PROVIDER_SITE_OTHER): Payer: Medicare HMO | Admitting: Family Medicine

## 2021-06-30 VITALS — BP 122/80 | HR 79 | Temp 98.4°F | Ht 66.0 in | Wt 197.8 lb

## 2021-06-30 DIAGNOSIS — J301 Allergic rhinitis due to pollen: Secondary | ICD-10-CM | POA: Diagnosis not present

## 2021-06-30 DIAGNOSIS — E1169 Type 2 diabetes mellitus with other specified complication: Secondary | ICD-10-CM

## 2021-06-30 DIAGNOSIS — E782 Mixed hyperlipidemia: Secondary | ICD-10-CM | POA: Diagnosis not present

## 2021-06-30 DIAGNOSIS — E119 Type 2 diabetes mellitus without complications: Secondary | ICD-10-CM

## 2021-06-30 LAB — POCT GLYCOSYLATED HEMOGLOBIN (HGB A1C): Hemoglobin A1C: 5.8 % — AB (ref 4.0–5.6)

## 2021-06-30 MED ORDER — AZELASTINE HCL 0.1 % NA SOLN: 1.0000 | Freq: Two times a day (BID) | NASAL | 11 refills | Status: DC

## 2021-06-30 MED ORDER — LEVOCETIRIZINE DIHYDROCHLORIDE 5 MG PO TABS: 5.0000 mg | ORAL_TABLET | Freq: Every evening | ORAL | 3 refills | Status: DC

## 2021-06-30 NOTE — Patient Instructions (Signed)
Please return in 3 months for your annual complete physical; please come fasting.   If you have any questions or concerns, please don't hesitate to send me a message via MyChart or call the office at [phone]. Thank you for visiting with us today! It's our pleasure caring for you.  

## 2021-06-30 NOTE — Progress Notes (Signed)
? ?Subjective  ?CC:  ?Chief Complaint  ?Patient presents with  ? Diabetes  ? Hypothyroidism  ? ? ?HPI: Holly Riley is a 66 y.o. female who presents to the office today for follow up of diabetes and problems listed above in the chief complaint.  ?Diabetes follow up: Her diabetic control is reported as improved. Diet controlled. Eating well. No sxs of hyperglycemia.  ?Thyroid: feels well. Energy level is normal. Compliant with meds.  ?C/o allergy sxs. No sxs of infection.  ?Statin intolerant. ? ?Lab Results  ?Component Value Date  ? TSH 1.37 10/21/2020  ? ? ?Wt Readings from Last 3 Encounters:  ?06/30/21 197 lb 12.8 oz (89.7 kg)  ?12/31/20 205 lb 12.8 oz (93.4 kg)  ?10/21/20 207 lb 6.4 oz (94.1 kg)  ? ? ?BP Readings from Last 3 Encounters:  ?06/30/21 122/80  ?12/31/20 128/80  ?10/21/20 126/74  ? ? ?Assessment  ?1. Controlled type 2 diabetes mellitus without complication, without long-term current use of insulin ([place])   ?2. Combined hyperlipidemia associated with type 2 diabetes mellitus ([place])   ?3. Seasonal allergic rhinitis due to pollen   ? ?  ?Plan  ?Diabetes is currently very well controlled. Continue diet and exercise.  ?HLD: LDL < 100 w/o meds. Statin intolerant ?SAR: restart meds: astelin and xyzal.  ?Thyroid is clinically controlled.  ? ?Follow up: 3 mo for cpe. ?No orders of the defined types were placed in this encounter. ? ?Meds ordered this encounter  ?Medications  ? azelastine (ASTELIN) 0.1 % nasal spray  ?  Sig: Place 1 spray into both nostrils 2 (two) times daily. Use in each nostril as directed  ?  Dispense:  30 mL  ?  Refill:  11  ? levocetirizine (XYZAL) 5 MG tablet  ?  Sig: Take 1 tablet (5 mg total) by mouth every evening.  ?  Dispense:  90 tablet  ?  Refill:  3  ? ?  ? ?Immunization History  ?Administered Date(s) Administered  ? DTaP 04/27/2006  ? Fluad Quad(high Dose 65+) 12/29/2020  ? Hepatitis B, ped/adol 04/28/1991  ? Influenza Split 12/26/2007  ? Influenza, Quadrivalent, Recombinant,  Inj, Pf 01/22/2013  ? Influenza, Seasonal, Injecte, Preservative Fre 12/13/2013, 11/20/2014  ? Influenza,inj,Quad PF,6+ Mos 12/17/2015, 01/07/2019  ? Influenza-Unspecified 12/14/2016, 02/08/2020  ? Pneumococcal Conjugate-13 11/23/2018  ? Pneumococcal Polysaccharide-23 08/27/2014, 07/04/2020  ? Tdap 10/19/2014  ? Zoster Recombinat (Shingrix) 11/25/2020, 05/12/2021  ? ? ?Diabetes Related Lab Review: ?Lab Results  ?Component Value Date  ? HGBA1C 5.8 (A) 06/30/2021  ? HGBA1C 6.3 (A) 12/31/2020  ? HGBA1C 7.2 (H) 10/21/2020  ?  ?Lab Results  ?Component Value Date  ? MICROALBUR <0.7 10/21/2020  ? ?Lab Results  ?Component Value Date  ? CREATININE 0.99 12/31/2020  ? BUN 22 12/31/2020  ? NA 139 12/31/2020  ? K 3.9 12/31/2020  ? CL 102 12/31/2020  ? CO2 29 12/31/2020  ? ?Lab Results  ?Component Value Date  ? CHOL 175 10/21/2020  ? CHOL 214 (H) 10/18/2019  ? CHOL 228 (H) 10/10/2018  ? ?Lab Results  ?Component Value Date  ? HDL 68.00 10/21/2020  ? HDL 79 10/18/2019  ? HDL 73.10 10/10/2018  ? ?Lab Results  ?Component Value Date  ? [place] 92 10/21/2020  ? LDLCALC 115 (H) 10/18/2019  ? LDLCALC 137 (H) 10/10/2018  ? ?Lab Results  ?Component Value Date  ? TRIG 74.0 10/21/2020  ? TRIG 92 10/18/2019  ? TRIG 91.0 10/10/2018  ? ?Lab Results  ?  Component Value Date  ? CHOLHDL 3 10/21/2020  ? CHOLHDL 2.7 10/18/2019  ? CHOLHDL 3 10/10/2018  ? ?No results found for: LDLDIRECT ?The 10-year ASCVD risk score (Arnett DK, et al., 2019) is: 17.8% ?  Values used to calculate the score: ?    Age: 61 years ?    Sex: Female ?    Is Non-Hispanic African American: No ?    Diabetic: Yes ?    Tobacco smoker: Yes ?    Systolic Blood Pressure: 332 mmHg ?    Is BP treated: No ?    HDL Cholesterol: 68 mg/dL ?    Total Cholesterol: 175 mg/dL ?I have reviewed the [place] and Soc history. ?Patient Active Problem List  ? Diagnosis Date Noted  ? Statin intolerance 03/20/2017  ?  Priority: High  ?  Myalgias, simvistatin and crestor ? ?  ? Controlled type 2  diabetes mellitus without complication, without long-term current use of insulin ([place]) 02/17/2017  ?  Priority: High  ? Combined hyperlipidemia associated with type 2 diabetes mellitus ([place]) 02/17/2017  ?  Priority: High  ? Spinal stenosis   ?  Priority: High  ?  NS - early, MRI ? ?  ? Hypothyroidism 03/26/2013  ?  Priority: High  ? Obesity (BMI 30.0-34.9) 01/22/2013  ?  Priority: High  ? Insomnia 09/11/2013  ?  Priority: Medium   ? Osteoarthritis 04/28/2011  ?  Priority: Medium   ? Vitamin D deficiency 03/20/2017  ?  Priority: Low  ? Allergic rhinitis 11/25/2009  ?  Priority: Low  ? Osteoporosis screening 11/16/2018  ?  Normal bone density 2020; recheck 2025 ? ?  ? ? ?Social History: ?Patient  reports that she has never smoked. She has never used smokeless tobacco. She reports that she does not drink alcohol and does not use drugs. ? ?Review of Systems: ?Ophthalmic: negative for eye pain, loss of vision or double vision ?Cardiovascular: negative for chest pain ?Respiratory: negative for SOB or persistent cough ?Gastrointestinal: negative for abdominal pain ?Genitourinary: negative for dysuria or gross hematuria ?MSK: negative for foot lesions ?Neurologic: negative for weakness or gait disturbance ? ?Objective  ?Vitals: BP 122/80   Pulse 79   Temp 98.4 ?F (36.9 ?C)   Ht '5\' 6"'$  (1.676 m)   Wt 197 lb 12.8 oz (89.7 kg)   SpO2 98%   BMI 31.93 kg/m?  ?General: well appearing, no acute distress  ?Psych:  Alert and oriented, normal mood and affect ?HEENT:  Normocephalic, atraumatic, moist mucous membranes, supple neck  ?Cardiovascular:  Nl S1 and S2, RRR without murmur, gallop or rub. no edema ?Respiratory:  Good breath sounds bilaterally, CTAB with normal effort, no rales ? ? ? ? ?Diabetic education: ongoing education regarding chronic disease management for diabetes was given today. We continue to reinforce the ABC's of diabetic management: A1c (<7 or 8 dependent upon patient), tight blood pressure control, and  cholesterol management with goal LDL < 100 minimally. We discuss diet strategies, exercise recommendations, medication options and possible side effects. At each visit, we review recommended immunizations and preventive care recommendations for diabetics and stress that good diabetic control can prevent other problems. See below for this patient's data. ? ? ?Commons side effects, risks, benefits, and alternatives for medications and treatment plan prescribed today were discussed, and the patient expressed understanding of the given instructions. Patient is instructed to call or message via MyChart if he/she has any questions or concerns regarding our treatment plan. No  barriers to understanding were identified. We discussed Red Flag symptoms and signs in detail. Patient expressed understanding regarding what to do in case of urgent or emergency type symptoms.  ?Medication list was reconciled, printed and provided to the patient in AVS. Patient instructions and summary information was reviewed with the patient as documented in the AVS. ?This note was prepared with assistance of Systems analyst. Occasional wrong-word or sound-a-like substitutions may have occurred due to the inherent limitations of voice recognition software ? ?

## 2021-07-01 ENCOUNTER — Other Ambulatory Visit: Payer: Self-pay | Admitting: Family Medicine

## 2021-09-28 DIAGNOSIS — L820 Inflamed seborrheic keratosis: Secondary | ICD-10-CM | POA: Diagnosis not present

## 2021-09-28 DIAGNOSIS — D2272 Melanocytic nevi of left lower limb, including hip: Secondary | ICD-10-CM | POA: Diagnosis not present

## 2021-09-28 DIAGNOSIS — D225 Melanocytic nevi of trunk: Secondary | ICD-10-CM | POA: Diagnosis not present

## 2021-09-28 DIAGNOSIS — Z85828 Personal history of other malignant neoplasm of skin: Secondary | ICD-10-CM | POA: Diagnosis not present

## 2021-09-28 DIAGNOSIS — D2261 Melanocytic nevi of right upper limb, including shoulder: Secondary | ICD-10-CM | POA: Diagnosis not present

## 2021-09-28 DIAGNOSIS — D2271 Melanocytic nevi of right lower limb, including hip: Secondary | ICD-10-CM | POA: Diagnosis not present

## 2021-09-28 DIAGNOSIS — L738 Other specified follicular disorders: Secondary | ICD-10-CM | POA: Diagnosis not present

## 2021-09-28 DIAGNOSIS — L821 Other seborrheic keratosis: Secondary | ICD-10-CM | POA: Diagnosis not present

## 2021-09-28 DIAGNOSIS — D2372 Other benign neoplasm of skin of left lower limb, including hip: Secondary | ICD-10-CM | POA: Diagnosis not present

## 2021-10-01 DIAGNOSIS — M65311 Trigger thumb, right thumb: Secondary | ICD-10-CM | POA: Diagnosis not present

## 2021-10-01 DIAGNOSIS — R29898 Other symptoms and signs involving the musculoskeletal system: Secondary | ICD-10-CM | POA: Diagnosis not present

## 2021-10-19 ENCOUNTER — Ambulatory Visit (INDEPENDENT_AMBULATORY_CARE_PROVIDER_SITE_OTHER): Payer: Medicare HMO | Admitting: Family Medicine

## 2021-10-19 ENCOUNTER — Encounter: Payer: Self-pay | Admitting: Family Medicine

## 2021-10-19 VITALS — BP 138/78 | HR 79 | Temp 98.0°F | Ht 66.0 in | Wt 189.8 lb

## 2021-10-19 DIAGNOSIS — E782 Mixed hyperlipidemia: Secondary | ICD-10-CM

## 2021-10-19 DIAGNOSIS — G729 Myopathy, unspecified: Secondary | ICD-10-CM | POA: Diagnosis not present

## 2021-10-19 DIAGNOSIS — E559 Vitamin D deficiency, unspecified: Secondary | ICD-10-CM | POA: Diagnosis not present

## 2021-10-19 DIAGNOSIS — E039 Hypothyroidism, unspecified: Secondary | ICD-10-CM | POA: Diagnosis not present

## 2021-10-19 DIAGNOSIS — Z0000 Encounter for general adult medical examination without abnormal findings: Secondary | ICD-10-CM | POA: Diagnosis not present

## 2021-10-19 DIAGNOSIS — Z789 Other specified health status: Secondary | ICD-10-CM | POA: Diagnosis not present

## 2021-10-19 DIAGNOSIS — E119 Type 2 diabetes mellitus without complications: Secondary | ICD-10-CM

## 2021-10-19 DIAGNOSIS — E1169 Type 2 diabetes mellitus with other specified complication: Secondary | ICD-10-CM

## 2021-10-19 LAB — COMPREHENSIVE METABOLIC PANEL
ALT: 21 U/L (ref 0–35)
AST: 21 U/L (ref 0–37)
Albumin: 3.8 g/dL (ref 3.5–5.2)
Alkaline Phosphatase: 88 U/L (ref 39–117)
BUN: 12 mg/dL (ref 6–23)
CO2: 27 mEq/L (ref 19–32)
Calcium: 9.1 mg/dL (ref 8.4–10.5)
Chloride: 104 mEq/L (ref 96–112)
Creatinine, Ser: 0.82 mg/dL (ref 0.40–1.20)
GFR: 73.67 mL/min (ref >60.00)
Glucose, Bld: 125 mg/dL — ABNORMAL HIGH (ref 70–99)
Potassium: 3.8 mEq/L (ref 3.5–5.1)
Sodium: 141 mEq/L (ref 135–145)
Total Bilirubin: 0.4 mg/dL (ref 0.2–1.2)
Total Protein: 6.7 g/dL (ref 6.0–8.3)

## 2021-10-19 LAB — CBC WITH DIFFERENTIAL/PLATELET
Basophils Absolute: 0 10*3/uL (ref 0.0–0.1)
Basophils Relative: 0.5 % (ref 0.0–3.0)
Eosinophils Absolute: 0.1 10*3/uL (ref 0.0–0.7)
Eosinophils Relative: 1.7 % (ref 0.0–5.0)
HCT: 44.7 % (ref 36.0–46.0)
Hemoglobin: 14.8 g/dL (ref 12.0–15.0)
Lymphocytes Relative: 22.5 % (ref 12.0–46.0)
Lymphs Abs: 1.7 10*3/uL (ref 0.7–4.0)
MCHC: 33.1 g/dL (ref 30.0–36.0)
MCV: 96.2 fl (ref 78.0–100.0)
Monocytes Absolute: 0.6 10*3/uL (ref 0.1–1.0)
Monocytes Relative: 7.9 % (ref 3.0–12.0)
Neutro Abs: 5 10*3/uL (ref 1.4–7.7)
Neutrophils Relative %: 67.4 % (ref 43.0–77.0)
Platelets: 258 10*3/uL (ref 150.0–400.0)
RBC: 4.64 Mil/uL (ref 3.87–5.11)
RDW: 13.2 % (ref 11.5–15.5)
WBC: 7.3 10*3/uL (ref 4.0–10.5)

## 2021-10-19 LAB — LIPID PANEL
Cholesterol: 207 mg/dL — ABNORMAL HIGH (ref 0–200)
HDL: 68.5 mg/dL (ref >39.00)
LDL Cholesterol: 122 mg/dL — ABNORMAL HIGH (ref 0–99)
NonHDL: 138.33
Total CHOL/HDL Ratio: 3
Triglycerides: 81 mg/dL (ref 0.0–149.0)
VLDL: 16.2 mg/dL (ref 0.0–40.0)

## 2021-10-19 LAB — TSH: TSH: 2.17 u[IU]/mL (ref 0.35–5.50)

## 2021-10-19 LAB — T4, FREE: Free T4: 1.32 ng/dL (ref 0.60–1.60)

## 2021-10-19 LAB — HEMOGLOBIN A1C: Hgb A1c MFr Bld: 6.3 % (ref 4.6–6.5)

## 2021-10-19 NOTE — Progress Notes (Signed)
Subjective  Chief Complaint  Patient presents with   Annual Exam    Pt here for annual exam and is currently fasting     HPI: Holly Riley is a 66 y.o. female who presents to [facility] today for a Female Wellness Visit. She also has the concerns and/or needs as listed above in the chief complaint. These will be addressed in addition to the Health Maintenance Visit.   Wellness Visit: annual visit with health maintenance review and exam without Pap  HM: mammo and CRC screens are current. Imms up to date. Eye exam due later this year: no retinopathy. Eating better and weight is down.  Chronic disease f/u and/or acute problem visit: (deemed necessary to be done in addition to the wellness visit): Hypothyroidism: overall feels well on levothyroxine 65mg daily. However, at times feels cold/then hot. Her daughter-in-law practices alternative medicine and she requests several thyroid labs so she can review them.  Diet controlled diabetes: feels it is well controlled. No sxs of hyperglycemia or complications. She is not on ace or stating. Check urine nephropathy screen. Normotensive. Not on statin due to intoerance and LDL < 100.  HLD: takes red yeast rice.  Recheck d levels.   Assessment  1. Annual physical exam   2. Controlled type 2 diabetes mellitus without complication, without long-term current use of insulin (HCC)   3. Statin intolerance   4. Acquired hypothyroidism   5. Vitamin D deficiency   6. Combined hyperlipidemia associated with type 2 diabetes mellitus ([place]   7. Myopathy, unspecified      Plan  Female Wellness Visit: Age appropriate Health Maintenance and Prevention measures were discussed with patient. Included topics are cancer screening recommendations, ways to keep healthy (see AVS) including dietary and exercise recommendations, regular eye and dental care, use of seat belts, and avoidance of moderate alcohol use and tobacco use. Screens  are current BMI: discussed patient's BMI and encouraged positive lifestyle modifications to help get to or maintain a target BMI. HM needs and immunizations were addressed and ordered. See below for orders. See HM and immunization section for updates. Routine labs and screening tests ordered including cmp, cbc and lipids where appropriate. Discussed recommendations regarding Vit D and calcium supplementation (see AVS)  Chronic disease management visit and/or acute problem visit: DM: check a1c. Clinically controlled, diet alone. Urine MAC today not on ace.  HLD on red yeast rice. Statin intolerant.  Recheck thyroid levels, TSH and others per pt request. Continue levothyroxine 88 mcg daiyl.  Obesity: weight is trending downward,  Recheck D levels.   Follow up: 6 mo for dm recheck  Orders Placed This Encounter  Procedures   CBC with Differential/Platelet   Comprehensive metabolic panel   Hemoglobin A1c   Lipid panel   TSH   Microalbumin / creatinine urine ratio   T3   T4, free   T3, reverse   Thyroid peroxidase antibody   Thyroglobulin antibody   Insulin and C-Peptide   VITAMIN D 25 Hydroxy (Vit-D Deficiency, Fractures)   No orders of the defined types were placed in this encounter.     Body mass index is 30.63 kg/m. Wt Readings from Last 3 Encounters:  10/19/21 189 lb 12.8 oz (86.1 kg)  06/30/21 197 lb 12.8 oz (89.7 kg)  12/31/20 205 lb 12.8 oz (93.4 kg)     Patient Active Problem List   Diagnosis Date Noted   Statin intolerance 03/20/2017    Priority:  High    Myalgias, simvistatin and crestor    Controlled type 2 diabetes mellitus without complication, without long-term current use of insulin ([place]) 02/17/2017    Priority: High   Combined hyperlipidemia associated with type 2 diabetes mellitus ([place]) 02/17/2017    Priority: High   Spinal stenosis     Priority: High    NS - early, MRI    Hypothyroidism 03/26/2013    Priority: High   Obesity (BMI 30.0-34.9)  01/22/2013    Priority: High   Insomnia 09/11/2013    Priority: Medium    Osteoarthritis 04/28/2011    Priority: Medium    Vitamin D deficiency 03/20/2017    Priority: Low   Allergic rhinitis 11/25/2009    Priority: Low   Osteoporosis screening 11/16/2018    Normal bone density 2020; recheck [place] Maintenance  Topic Date Due   COVID-19 Vaccine (1) Never done   INFLUENZA VACCINE  09/29/2021   URINE MICROALBUMIN  10/21/2021   MAMMOGRAM  11/19/2021   OPHTHALMOLOGY EXAM  12/24/2021   HEMOGLOBIN A1C  12/31/2021   FOOT EXAM  10/20/2022   DEXA SCAN  11/10/2023   TETANUS/TDAP  10/18/2024   COLONOSCOPY (Pts 45-8yr Insurance coverage will need to be confirmed)  09/27/2029   Pneumonia Vaccine 68 Years old  Completed   Hepatitis C Screening  Completed   Zoster Vaccines- Shingrix  Completed   HPV VACCINES  Aged Out   Immunization History  Administered Date(s) Administered   DTaP 04/27/2006   Fluad Quad(high Dose 65+) 12/29/2020   Hepatitis B, ped/adol 04/28/1991   Influenza Split 12/26/2007   Influenza, Quadrivalent, Recombinant, Inj, Pf 01/22/2013   Influenza, Seasonal, Injecte, Preservative Fre 12/13/2013, 11/20/2014   Influenza,inj,Quad PF,6+ Mos 12/17/2015, 01/07/2019   Influenza-Unspecified 12/14/2016, 02/08/2020   Pneumococcal Conjugate-13 11/23/2018   Pneumococcal Polysaccharide-23 08/27/2014, 07/04/2020   Tdap 10/19/2014   Zoster Recombinat (Shingrix) 11/25/2020, 05/12/2021   We updated and reviewed the patient's past history in detail and it is documented below. Allergies: Patient is allergic to morphine and related, statins, dilaudid [hydromorphone], percocet [oxycodone-acetaminophen], and vicodin [hydrocodone-acetaminophen]. Past Medical History Patient  has a past medical history of Arthritis, Hyperlipidemia, Spinal stenosis, Thyroid disease, and Type 2 diabetes mellitus with hyperlipidemia ([place]. Past Surgical History Patient  has a past surgical  history that includes Abdominal hysterectomy; Carpal tunnel release (Right); Knee surgery (Left); Tonsillectomy and adenoidectomy; Spine surgery; Cervical fusion; and Lumbar laminectomy. Family History: Patient family history includes Cancer in her father, mother, and sister; Diabetes in her brother, father, and sister; Heart disease in her father; Hyperlipidemia in her father, mother, sister, and sister. Social History:  Patient  reports that she has never smoked. She has never used smokeless tobacco. She reports that she does not drink alcohol and does not use drugs.  Review of Systems: Constitutional: negative for fever or malaise Ophthalmic: negative for photophobia, double vision or loss of vision Cardiovascular: negative for chest pain, dyspnea on exertion, or new LE swelling Respiratory: negative for SOB or persistent cough Gastrointestinal: negative for abdominal pain, change in bowel habits or melena Genitourinary: negative for dysuria or gross hematuria, no abnormal uterine bleeding or disharge Musculoskeletal: negative for new gait disturbance or muscular weakness Integumentary: negative for new or persistent rashes, no breast lumps Neurological: negative for TIA or stroke symptoms Psychiatric: negative for SI or delusions Allergic/Immunologic: negative for hives  Patient Care Team    Relationship Specialty Notifications Start End  ALeamon Arnt MD PCP -  General Family Medicine  12/20/14   Ashok Pall, MD Consulting Physician Neurosurgery  02/17/17   Marygrace Drought, MD Consulting Physician Ophthalmology  05/17/17   Edythe Clarity, [facility]  Pharmacist  12/22/20    Comment: [phone]    Objective  Vitals: BP 138/78   Pulse 79   Temp 98 F (36.7 C)   Ht _0  (1.676 m)   Wt 189 lb 12.8 oz (86.1 kg)   SpO2 97%   BMI 30.63 kg/m  General:  Well developed, well nourished, no acute distress  Psych:  Alert and orientedx3,normal mood and affect HEENT:  Normocephalic,  atraumatic, non-icteric sclera,  supple neck without adenopathy, mass or thyromegaly Cardiovascular:  Normal S1, S2, RRR without gallop, rub or murmur Respiratory:  Good breath sounds bilaterally, CTAB with normal respiratory effort Gastrointestinal: normal bowel sounds, soft, non-tender, no noted masses. No HSM MSK: no deformities, contusions. Joints are without erythema or swelling.  Skin:  Warm, no rashes or suspicious lesions noted Diabetic Foot Exam: Appearance - no lesions, ulcers or calluses Skin - no sigificant pallor or erythema Monofilament testing - sensitive bilaterally in following locations:  Right - Great toe, medial, central, lateral ball and posterior foot intact  Left - Great toe, medial, central, lateral ball and posterior foot intact Pulses - +2 distally bilaterally   Commons side effects, risks, benefits, and alternatives for medications and treatment plan prescribed today were discussed, and the patient expressed understanding of the given instructions. Patient is instructed to call or message via MyChart if he/she has any questions or concerns regarding our treatment plan. No barriers to understanding were identified. We discussed Red Flag symptoms and signs in detail. Patient expressed understanding regarding what to do in case of urgent or emergency type symptoms.  Medication list was reconciled, printed and provided to the patient in AVS. Patient instructions and summary information was reviewed with the patient as documented in the AVS. This note was prepared with assistance of Dragon voice recognition software. Occasional wrong-word or sound-a-like substitutions may have occurred due to the inherent limitations of voice recognition software  This visit occurred during the SARS-CoV-2 public health emergency.  Safety protocols were in place, including screening questions prior to the visit, additional usage of staff PPE, and extensive cleaning of exam room while observing  appropriate contact time as indicated for disinfecting solutions.

## 2021-10-19 NOTE — Patient Instructions (Signed)
Please return in 6 months for diabetes follow up.   I will release your lab results to you on your MyChart account with further instructions. You may see the results before I do, but when I review them I will send you a message with my report or have my assistant call you if things need to be discussed. Please reply to my message with any questions. Thank you!   If you have any questions or concerns, please don't hesitate to send me a message via MyChart or call the office at [phone]. Thank you for visiting with [place] today! It's our pleasure caring for you.

## 2021-10-20 LAB — INSULIN AND C-PEPTIDE, SERUM
C-Peptide: 2.6 ng/mL (ref 1.1–4.4)
INSULIN: 6.8 u[IU]/mL (ref 2.6–24.9)

## 2021-10-21 ENCOUNTER — Encounter: Payer: Self-pay | Admitting: Family Medicine

## 2021-10-23 LAB — T3, REVERSE: T3, Reverse: 25 ng/dL (ref 8–25)

## 2021-10-23 LAB — THYROGLOBULIN ANTIBODY: Thyroglobulin Ab: <1 IU/mL (ref <=1)

## 2021-10-23 LAB — THYROID PEROXIDASE ANTIBODY: Thyroperoxidase Ab SerPl-aCnc: 1 IU/mL (ref <9)

## 2021-10-23 LAB — T3: T3, Total: 122 ng/dL (ref 76–181)

## 2021-10-29 DIAGNOSIS — M65311 Trigger thumb, right thumb: Secondary | ICD-10-CM | POA: Diagnosis not present

## 2021-11-23 ENCOUNTER — Encounter: Payer: Self-pay | Admitting: *Deleted

## 2021-11-26 DIAGNOSIS — Z1231 Encounter for screening mammogram for malignant neoplasm of breast: Secondary | ICD-10-CM | POA: Diagnosis not present

## 2021-11-26 LAB — HM MAMMOGRAPHY

## 2021-12-01 ENCOUNTER — Encounter: Payer: Self-pay | Admitting: Family Medicine

## 2022-01-11 DIAGNOSIS — H5203 Hypermetropia, bilateral: Secondary | ICD-10-CM | POA: Diagnosis not present

## 2022-01-11 DIAGNOSIS — R7303 Prediabetes: Secondary | ICD-10-CM | POA: Diagnosis not present

## 2022-01-11 DIAGNOSIS — H2513 Age-related nuclear cataract, bilateral: Secondary | ICD-10-CM | POA: Diagnosis not present

## 2022-01-11 DIAGNOSIS — H52203 Unspecified astigmatism, bilateral: Secondary | ICD-10-CM | POA: Diagnosis not present

## 2022-01-11 DIAGNOSIS — H43813 Vitreous degeneration, bilateral: Secondary | ICD-10-CM | POA: Diagnosis not present

## 2022-01-11 DIAGNOSIS — H524 Presbyopia: Secondary | ICD-10-CM | POA: Diagnosis not present

## 2022-01-11 DIAGNOSIS — H11001 Unspecified pterygium of right eye: Secondary | ICD-10-CM | POA: Diagnosis not present

## 2022-01-11 DIAGNOSIS — H04123 Dry eye syndrome of bilateral lacrimal glands: Secondary | ICD-10-CM | POA: Diagnosis not present

## 2022-01-11 LAB — HM DIABETES EYE EXAM

## 2022-02-11 ENCOUNTER — Encounter: Payer: Self-pay | Admitting: *Deleted

## 2022-04-06 ENCOUNTER — Ambulatory Visit: Payer: Medicare HMO | Admitting: Family Medicine

## 2022-05-04 ENCOUNTER — Ambulatory Visit: Payer: Medicare HMO | Admitting: Family Medicine

## 2022-05-12 ENCOUNTER — Encounter: Payer: Self-pay | Admitting: Family Medicine

## 2022-05-12 ENCOUNTER — Ambulatory Visit (INDEPENDENT_AMBULATORY_CARE_PROVIDER_SITE_OTHER): Payer: Medicare HMO | Admitting: Family Medicine

## 2022-05-12 VITALS — BP 160/82 | HR 90 | Temp 97.7°F | Ht 66.0 in | Wt 181.2 lb

## 2022-05-12 DIAGNOSIS — E119 Type 2 diabetes mellitus without complications: Secondary | ICD-10-CM

## 2022-05-12 DIAGNOSIS — Z789 Other specified health status: Secondary | ICD-10-CM

## 2022-05-12 DIAGNOSIS — E782 Mixed hyperlipidemia: Secondary | ICD-10-CM | POA: Diagnosis not present

## 2022-05-12 DIAGNOSIS — R030 Elevated blood-pressure reading, without diagnosis of hypertension: Secondary | ICD-10-CM | POA: Diagnosis not present

## 2022-05-12 DIAGNOSIS — T466X5A Adverse effect of antihyperlipidemic and antiarteriosclerotic drugs, initial encounter: Secondary | ICD-10-CM | POA: Diagnosis not present

## 2022-05-12 DIAGNOSIS — M791 Myalgia, unspecified site: Secondary | ICD-10-CM | POA: Diagnosis not present

## 2022-05-12 DIAGNOSIS — E1169 Type 2 diabetes mellitus with other specified complication: Secondary | ICD-10-CM

## 2022-05-12 LAB — POCT GLYCOSYLATED HEMOGLOBIN (HGB A1C): Hemoglobin A1C: 5.7 % — AB (ref 4.0–5.6)

## 2022-05-12 LAB — MICROALBUMIN / CREATININE URINE RATIO
Creatinine,U: 120.1 mg/dL
Microalb Creat Ratio: 0.6 mg/g (ref 0.0–30.0)
Microalb, Ur: <0.7 mg/dL (ref 0.0–1.9)

## 2022-05-12 NOTE — Progress Notes (Signed)
Subjective  CC:  Chief Complaint  Patient presents with   Diabetes    HPI: Holly Riley is a 67 y.o. female who presents to the office today for follow up of diabetes and problems listed above in the chief complaint.  Diabetes follow up: Her diabetic control is reported as Improved. She has worked hard on diet and has lost weight. 179 on her scales today. Eating well. Diet controlled.  She denies exertional CP or SOB or symptomatic hypoglycemia. She denies foot sores or paresthesias. Needs urine nephropathy screen. Not on ace No h/o HTN. Elevated today "because I've been running around". Intermittently checks at home and reports it is often normal, as low as 110/70. No cp no leg edema HLD intolerant to zetia and statins. Eating low fat  Wt Readings from Last 3 Encounters:  05/12/22 181 lb 3.2 oz (82.2 kg)  10/19/21 189 lb 12.8 oz (86.1 kg)  06/30/21 197 lb 12.8 oz (89.7 kg)    BP Readings from Last 3 Encounters:  05/12/22 (!) 160/82  10/19/21 138/78  06/30/21 122/80    Assessment  1. Controlled type 2 diabetes mellitus without complication, without long-term current use of insulin ([place])   2. Combined hyperlipidemia associated with type 2 diabetes mellitus (HCC)   3. Statin intolerance   4. Elevated blood pressure reading without diagnosis of hypertension   5. Myalgia due to statin      Plan  Diabetes is currently very well controlled. Check urine and start ace if + microalbuminuria. Pt hesitant to start today in setting of elevated bp. Continue diet.  HTN: start home readings and send me report in 2 weeks. Start ace if remains elevated.  HLD: diet controlled since can't take meds.   Follow up: 6 mo for cpe and recheck. Orders Placed This Encounter  Procedures   Microalbumin / creatinine urine ratio   POCT HgB A1C   No orders of the defined types were placed in this encounter.     Immunization History  Administered Date(s) Administered   DTaP 04/27/2006   Fluad  Quad(high Dose 65+) 12/29/2020   Hepatitis B, PED/ADOLESCENT 04/28/1991   Influenza Split 12/26/2007   Influenza, Quadrivalent, Recombinant, Inj, Pf 01/22/2013   Influenza, Seasonal, Injecte, Preservative Fre 12/13/2013, 11/20/2014   Influenza,inj,Quad PF,6+ Mos 12/17/2015, 01/07/2019   Influenza-Unspecified 12/14/2016, 02/08/2020   Pneumococcal Conjugate-13 11/23/2018   Pneumococcal Polysaccharide-23 08/27/2014, 07/04/2020   Tdap 10/19/2014   Zoster Recombinat (Shingrix) 11/25/2020, 05/12/2021    Diabetes Related Lab Review: Lab Results  Component Value Date   HGBA1C 5.7 (A) 05/12/2022   HGBA1C 6.3 10/19/2021   HGBA1C 5.8 (A) 06/30/2021    Lab Results  Component Value Date   MICROALBUR <0.7 10/21/2020   Lab Results  Component Value Date   CREATININE 0.82 10/19/2021   BUN 12 10/19/2021   NA 141 10/19/2021   K 3.8 10/19/2021   CL 104 10/19/2021   CO2 27 10/19/2021   Lab Results  Component Value Date   CHOL 207 (H) 10/19/2021   CHOL 175 10/21/2020   CHOL 214 (H) 10/18/2019   Lab Results  Component Value Date   HDL 68.50 10/19/2021   HDL 68.00 10/21/2020   HDL 79 10/18/2019   Lab Results  Component Value Date   LDLCALC 122 (H) 10/19/2021   LDLCALC 92 10/21/2020   LDLCALC 115 (H) 10/18/2019   Lab Results  Component Value Date   TRIG 81.0 10/19/2021   TRIG 74.0 10/21/2020   TRIG  92 10/18/2019   Lab Results  Component Value Date   CHOLHDL 3 10/19/2021   CHOLHDL 3 10/21/2020   CHOLHDL 2.7 10/18/2019   No results found for: "LDLDIRECT" The 10-year ASCVD risk score (Arnett DK, et al., 2019) is: 32.5%   Values used to calculate the score:     Age: 39 years     Sex: Female     Is Non-Hispanic African American: No     Diabetic: Yes     Tobacco smoker: Yes     Systolic Blood Pressure: 0000000 mmHg     Is BP treated: No     HDL Cholesterol: 68.5 mg/dL     Total Cholesterol: 207 mg/dL I have reviewed the PMH, Fam and Soc history. Patient Active Problem List    Diagnosis Date Noted   Statin intolerance 03/20/2017    Priority: High    Myalgias, simvistatin and crestor    Controlled type 2 diabetes mellitus without complication, without long-term current use of insulin ([place]) 02/17/2017    Priority: High   Combined hyperlipidemia associated with type 2 diabetes mellitus ([place]) 02/17/2017    Priority: High   Spinal stenosis     Priority: High    NS - early, MRI    Hypothyroidism 03/26/2013    Priority: High   Obesity (BMI 30.0-34.9) 01/22/2013    Priority: High   Insomnia 09/11/2013    Priority: Medium    Osteoarthritis 04/28/2011    Priority: Medium    Vitamin D deficiency 03/20/2017    Priority: Low   Allergic rhinitis 11/25/2009    Priority: Low   Osteoporosis screening 11/16/2018    Normal bone density 2020; recheck 2025     Social History: Patient  reports that she has never smoked. She has never used smokeless tobacco. She reports that she does not drink alcohol and does not use drugs.  Review of Systems: Ophthalmic: negative for eye pain, loss of vision or double vision Cardiovascular: negative for chest pain Respiratory: negative for SOB or persistent cough Gastrointestinal: negative for abdominal pain Genitourinary: negative for dysuria or gross hematuria MSK: negative for foot lesions Neurologic: negative for weakness or gait disturbance  Objective  Vitals: BP (!) 160/82   Pulse 90   Temp 97.7 F (36.5 C)   Ht '5\' 6"'$  (1.676 m)   Wt 181 lb 3.2 oz (82.2 kg)   SpO2 97%   BMI 29.25 kg/m  General: well appearing, no acute distress  Psych:  Alert and oriented, normal mood and affect HEENT:  Normocephalic, atraumatic, moist mucous membranes, supple neck  Cardiovascular:  Nl S1 and S2, RRR without murmur, gallop or rub. no edema Respiratory:  Good breath sounds bilaterally, CTAB with normal effort, no rales Neurologic:   Mental status is normal. normal gait   Diabetic education: ongoing education regarding chronic  disease management for diabetes was given today. We continue to reinforce the ABC's of diabetic management: A1c (<7 or 8 dependent upon patient), tight blood pressure control, and cholesterol management with goal LDL < 100 minimally. We discuss diet strategies, exercise recommendations, medication options and possible side effects. At each visit, we review recommended immunizations and preventive care recommendations for diabetics and stress that good diabetic control can prevent other problems. See below for this patient's data.   Commons side effects, risks, benefits, and alternatives for medications and treatment plan prescribed today were discussed, and the patient expressed understanding of the given instructions. Patient is instructed to call or message via MyChart  if he/she has any questions or concerns regarding our treatment plan. No barriers to understanding were identified. We discussed Red Flag symptoms and signs in detail. Patient expressed understanding regarding what to do in case of urgent or emergency type symptoms.  Medication list was reconciled, printed and provided to the patient in AVS. Patient instructions and summary information was reviewed with the patient as documented in the AVS. This note was prepared with assistance of Dragon voice recognition software. Occasional wrong-word or sound-a-like substitutions may have occurred due to the inherent limitations of voice recognition software

## 2022-05-12 NOTE — Patient Instructions (Signed)
Please return in 6 months for your annual complete physical; please come fasting. Sooner if your home blood pressure readings remain elevated.   Please check your blood pressure at variable times during the day, once or twice daily for 2 weeks and send me the numbers in Mychart.   If you have any questions or concerns, please don't hesitate to send me a message via MyChart or call the office at [phone]. Thank you for visiting with [place] today! It's our pleasure caring for you.

## 2022-05-27 ENCOUNTER — Encounter: Payer: Self-pay | Admitting: Family Medicine

## 2022-06-01 ENCOUNTER — Ambulatory Visit (INDEPENDENT_AMBULATORY_CARE_PROVIDER_SITE_OTHER): Payer: Medicare HMO

## 2022-06-01 VITALS — Wt 181.0 lb

## 2022-06-01 DIAGNOSIS — Z0000 Encounter for general adult medical examination without abnormal findings: Secondary | ICD-10-CM

## 2022-06-01 MED ORDER — LISINOPRIL 10 MG PO TABS: 10.0000 mg | ORAL_TABLET | Freq: Every day | ORAL | 3 refills | Status: DC

## 2022-06-01 NOTE — Patient Instructions (Signed)
Ms. Urata , Thank you for taking time to come for your Medicare Wellness Visit. I appreciate your ongoing commitment to your health goals. Please review the following plan we discussed and let me know if I can assist you in the future.   These are the goals we discussed:  Goals      Patient Stated     Get A1C down      Patient Stated     Lose weight      Patient Stated     Stay active      [place] (see longitudinal plan of care for additional care plan information)  Current Barriers:  Chronic Disease Management support, education, and care coordination needs related to Hyperlipidemia and Diabetes   Hyperlipidemia/statin intolerance  Lipid Panel     Component Value Date/Time   CHOL 214 (H) 10/18/2019 1014   TRIG 92 10/18/2019 1014   HDL 79 10/18/2019 1014   CHOLHDL 2.7 10/18/2019 1014   VLDL 18.2 10/10/2018 1028   LDLCALC 115 (H) 10/18/2019 1014  Pharmacist Clinical Goal(s): Over the next 180 days, patient will work with PharmD and providers to achieve LDL goal < 100 Current regimen:  Zetia 10 mg once daily Interventions: We discussed LDL cholesterol goals - above goal. Reviewed tolerability/side effects. Patient self care activities - Over the next 180 days, patient will: Maintain diet/exercise  Diabetes Lab Results  Component Value Date/Time   HGBA1C 5.8 (A) 10/18/2019 09:43 AM   HGBA1C 5.9 (A) 05/16/2019 10:38 AM   HGBA1C 5.8 02/09/2018 08:14 AM   HGBA1C 6.4 05/17/2017 09:49 AM   HGBA1C 6.5 02/17/2017 10:48 AM   HGBA1C 5.8 10/28/2016 12:00 AM  Pharmacist Clinical Goal(s): Over the next 180 days, patient will work with PharmD and providers to maintain A1c goal <6.5% Current regimen:  Diet/exercise alone Interventions: We discussed a1c goals - currently at goal. Reviewed diet/exercise - Maintain a healthy weight and exercise regularly, as directed by your health care provider. Eat healthy foods, such as: Lean proteins, complex  carbohydrates, fresh fruits and vegetables, low-fat dairy products, healthy fats. Patient self care activities - Over the next 180 days, patient will: Check blood sugar only if advised, document, and provide at future appointments  Medication management Pharmacist Clinical Goal(s): Over the next 365 days, patient will work with PharmD and providers to maintain optimal medication adherence Current pharmacy: [facility] Interventions Comprehensive medication review performed. Continue current medication management strategy Patient self care activities - Over the next 365 days, patient will: Take medications as prescribed Report any questions or concerns to PharmD and/or provider(s) Initial goal documentation.        This is a list of the screening recommended for you and due dates:  Health Maintenance  Topic Date Due   COVID-19 Vaccine (1) Never done   Flu Shot  09/30/2022   Yearly kidney function blood test for diabetes  10/20/2022   Complete foot exam   10/20/2022   Hemoglobin A1C  11/12/2022   Mammogram  11/27/2022   Eye exam for diabetics  01/12/2023   Yearly kidney health urinalysis for diabetes  05/12/2023   Medicare Annual Wellness Visit  06/01/2023   DEXA scan (bone density measurement)  11/10/2023   DTaP/Tdap/Td vaccine (3 - Td or Tdap) 10/18/2024   Colon Cancer Screening  09/27/2029   Pneumonia Vaccine  Completed   Hepatitis C Screening: USPSTF Recommendation to screen - Ages 48-79 yo.  Completed  Zoster (Shingles) Vaccine  Completed   HPV Vaccine  Aged Out    Advanced directives: Advance directive discussed with you today. Even though you declined this today please call our office should you change your mind and we can give you the proper paperwork for you to fill out.  Conditions/risks identified: stay active   Next appointment: Follow up in one year for your annual wellness visit    Preventive Care 65 Years and Older, Female Preventive care refers to  lifestyle choices and visits with your health care provider that can promote health and wellness. What does preventive care include? A yearly physical exam. This is also called an annual well check. Dental exams once or twice a year. Routine eye exams. Ask your health care provider how often you should have your eyes checked. Personal lifestyle choices, including: Daily care of your teeth and gums. Regular physical activity. Eating a healthy diet. Avoiding tobacco and drug use. Limiting alcohol use. Practicing safe sex. Taking low-dose aspirin every day. Taking vitamin and mineral supplements as recommended by your health care provider. What happens during an annual well check? The services and screenings done by your health care provider during your annual well check will depend on your age, overall health, lifestyle risk factors, and family history of disease. Counseling  Your health care provider may ask you questions about your: Alcohol use. Tobacco use. Drug use. Emotional well-being. Home and relationship well-being. Sexual activity. Eating habits. History of falls. Memory and ability to understand (cognition). Work and work Statistician. Reproductive health. Screening  You may have the following tests or measurements: Height, weight, and BMI. Blood pressure. Lipid and cholesterol levels. These may be checked every 5 years, or more frequently if you are over 21 years old. Skin check. Lung cancer screening. You may have this screening every year starting at age 27 if you have a 30-pack-year history of smoking and currently smoke or have quit within the past 15 years. Fecal occult blood test (FOBT) of the stool. You may have this test every year starting at age 76. Flexible sigmoidoscopy or colonoscopy. You may have a sigmoidoscopy every 5 years or a colonoscopy every 10 years starting at age 57. Hepatitis C blood test. Hepatitis B blood test. Sexually transmitted disease  (STD) testing. Diabetes screening. This is done by checking your blood sugar (glucose) after you have not eaten for a while (fasting). You may have this done every 1-3 years. Bone density scan. This is done to screen for osteoporosis. You may have this done starting at age 12. Mammogram. This may be done every 1-2 years. Talk to your health care provider about how often you should have regular mammograms. Talk with your health care provider about your test results, treatment options, and if necessary, the need for more tests. Vaccines  Your health care provider may recommend certain vaccines, such as: Influenza vaccine. This is recommended every year. Tetanus, diphtheria, and acellular pertussis (Tdap, Td) vaccine. You may need a Td booster every 10 years. Zoster vaccine. You may need this after age 45. Pneumococcal 13-valent conjugate (PCV13) vaccine. One dose is recommended after age 46. Pneumococcal polysaccharide (PPSV23) vaccine. One dose is recommended after age 4. Talk to your health care provider about which screenings and vaccines you need and how often you need them. This information is not intended to replace advice given to you by your health care provider. Make sure you discuss any questions you have with your health care provider. Document Released:  03/14/2015 Document Revised: 11/05/2015 Document Reviewed: 12/17/2014 Elsevier Interactive Patient Education  2017 [place] Prevention in the Home Falls can cause injuries. They can happen to people of all ages. There are many things you can do to make your home safe and to help prevent falls. What can I do on the outside of my home? Regularly fix the edges of walkways and driveways and fix any cracks. Remove anything that might make you trip as you walk through a door, such as a raised step or threshold. Trim any bushes or trees on the path to your home. Use bright outdoor lighting. Clear any walking paths of anything  that might make someone trip, such as rocks or tools. Regularly check to see if handrails are loose or broken. Make sure that both sides of any steps have handrails. Any raised decks and porches should have guardrails on the edges. Have any leaves, snow, or ice cleared regularly. Use sand or salt on walking paths during winter. Clean up any spills in your garage right away. This includes oil or grease spills. What can I do in the bathroom? Use night lights. Install grab bars by the toilet and in the tub and shower. Do not use towel bars as grab bars. Use non-skid mats or decals in the tub or shower. If you need to sit down in the shower, use a plastic, non-slip stool. Keep the floor dry. Clean up any water that spills on the floor as soon as it happens. Remove soap buildup in the tub or shower regularly. Attach bath mats securely with double-sided non-slip rug tape. Do not have throw rugs and other things on the floor that can make you trip. What can I do in the bedroom? Use night lights. Make sure that you have a light by your bed that is easy to reach. Do not use any sheets or blankets that are too big for your bed. They should not hang down onto the floor. Have a firm chair that has side arms. You can use this for support while you get dressed. Do not have throw rugs and other things on the floor that can make you trip. What can I do in the kitchen? Clean up any spills right away. Avoid walking on wet floors. Keep items that you use a lot in easy-to-reach places. If you need to reach something above you, use a strong step stool that has a grab bar. Keep electrical cords out of the way. Do not use floor polish or wax that makes floors slippery. If you must use wax, use non-skid floor wax. Do not have throw rugs and other things on the floor that can make you trip. What can I do with my stairs? Do not leave any items on the stairs. Make sure that there are handrails on both sides of  the stairs and use them. Fix handrails that are broken or loose. Make sure that handrails are as long as the stairways. Check any carpeting to make sure that it is firmly attached to the stairs. Fix any carpet that is loose or worn. Avoid having throw rugs at the top or bottom of the stairs. If you do have throw rugs, attach them to the floor with carpet tape. Make sure that you have a light switch at the top of the stairs and the bottom of the stairs. If you do not have them, ask someone to add them for you. What else can I do to help prevent  falls? Wear shoes that: Do not have high heels. Have rubber bottoms. Are comfortable and fit you well. Are closed at the toe. Do not wear sandals. If you use a stepladder: Make sure that it is fully opened. Do not climb a closed stepladder. Make sure that both sides of the stepladder are locked into place. Ask someone to hold it for you, if possible. Clearly mark and make sure that you can see: Any grab bars or handrails. First and last steps. Where the edge of each step is. Use tools that help you move around (mobility aids) if they are needed. These include: Canes. Walkers. Scooters. Crutches. Turn on the lights when you go into a dark area. Replace any light bulbs as soon as they burn out. Set up your furniture so you have a clear path. Avoid moving your furniture around. If any of your floors are uneven, fix them. If there are any pets around you, be aware of where they are. Review your medicines with your doctor. Some medicines can make you feel dizzy. This can increase your chance of falling. Ask your doctor what other things that you can do to help prevent falls. This information is not intended to replace advice given to you by your health care provider. Make sure you discuss any questions you have with your health care provider. Document Released: 12/12/2008 Document Revised: 07/24/2015 Document Reviewed: 03/22/2014 Elsevier Interactive  Patient Education  2017 [employer].

## 2022-06-01 NOTE — Progress Notes (Signed)
I connected with  Holly Riley on 06/01/22 by a audio enabled telemedicine application and verified that I am speaking with the correct person using two identifiers.  Patient Location: Home  Provider Location: Office/Clinic  I discussed the limitations of evaluation and management by telemedicine. The patient expressed understanding and agreed to proceed.   Subjective:   Holly Riley is a 67 y.o. female who presents for Medicare Annual (Subsequent) preventive examination.  Review of Systems     Cardiac Risk Factors include: advanced age (>45men, >48 women);dyslipidemia;diabetes mellitus     Objective:    Today's Vitals   06/01/22 0835  Weight: 181 lb (82.1 kg)   Body mass index is 29.21 kg/m.     06/01/2022    8:40 AM 05/29/2021    8:52 AM 05/23/2020    8:55 AM  Advanced Directives  Does Patient Have a Medical Advance Directive? No No No  Would patient like information on creating a medical advance directive? No - Patient declined No - Patient declined No - Patient declined    Current Medications (verified) Outpatient Encounter Medications as of 06/01/2022  Medication Sig   ASHWAGANDHA PO    azelastine (ASTELIN) 0.1 % nasal spray Place 1 spray into both nostrils 2 (two) times daily. Use in each nostril as directed   Cholecalciferol (VITAMIN D3) 50 MCG (2000 UT) capsule Take 2,000 Units by mouth daily.    Chromium-Cinnamon [phone] MCG-MG CAPS    Coenzyme Q10 (CO Q 10 PO)    Flaxseed, Linseed, (FLAX SEED OIL PO) Take 1 tablet by mouth daily.   Garlic 10 MG CAPS Take 10 mg by mouth daily.   Krill Oil 500 MG CAPS    levothyroxine (SYNTHROID) 88 MCG tablet TAKE 1 TABLET(88 MCG) BY MOUTH DAILY BEFORE BREAKFAST   Omega-3 Fatty Acids (FISH OIL CONCENTRATE) 1000 MG CAPS Take 1,000 mg by mouth daily.   Red Yeast Rice 600 MG TABS    No facility-administered encounter medications on file as of 06/01/2022.    Allergies (verified) Morphine and related, Statins, Dilaudid  [hydromorphone], Percocet [oxycodone-acetaminophen], Vicodin [hydrocodone-acetaminophen], and Zetia [ezetimibe]   History: Past Medical History:  Diagnosis Date   Arthritis    Hyperlipidemia    Spinal stenosis    NS - early, MRI   Thyroid disease    Type 2 diabetes mellitus with hyperlipidemia    Past Surgical History:  Procedure Laterality Date   ABDOMINAL HYSTERECTOMY     CARPAL TUNNEL RELEASE Right    CERVICAL FUSION     [place]   KNEE SURGERY Left    LUMBAR LAMINECTOMY     x 2, L4, L5, [place]   SPINE SURGERY     TONSILLECTOMY AND ADENOIDECTOMY     Family History  Problem Relation Age of Onset   Cancer Mother    Hyperlipidemia Mother    Cancer Father    Diabetes Father    Heart disease Father    Hyperlipidemia Father    Cancer Sister    Diabetes Sister    Hyperlipidemia Sister    Diabetes Brother    Hyperlipidemia Sister    Social History   Socioeconomic History   Marital status: Married    Spouse name: Not on file   Number of children: Not on file   Years of education: Not on file   Highest education level: Not on file  Occupational History   Occupation: Programmer, multimedia: [employer]    Comment: Retired  Tobacco Use   Smoking status: Never   Smokeless tobacco: Never  Vaping Use   Vaping Use: Never used  Substance and Sexual Activity   Alcohol use: No   Drug use: No   Sexual activity: Never  Other Topics Concern   Not on file  Social History Narrative   Not on file   Social Determinants of Health   Financial Resource Strain: Low Risk  (06/01/2022)   Overall Financial Resource Strain (CARDIA)    Difficulty of Paying Living Expenses: Not hard at all  Food Insecurity: No Food Insecurity (06/01/2022)   Hunger Vital Sign    Worried About Running Out of Food in the Last Year: Never true    Ran Out of Food in the Last Year: Never true  Transportation Needs: No Transportation Needs (06/01/2022)   PRAPARE - Hydrologist  (Medical): No    Lack of Transportation (Non-Medical): No  Physical Activity: Sufficiently Active (06/01/2022)   Exercise Vital Sign    Days of Exercise per Week: 7 days    Minutes of Exercise per Session: 60 min  Stress: No Stress Concern Present (06/01/2022)   [place]    Feeling of Stress : Not at all  Social Connections: Moderately Integrated (06/01/2022)   Social Connection and Isolation Panel [NHANES]    Frequency of Communication with Friends and Family: More than three times a week    Frequency of Social Gatherings with Friends and Family: More than three times a week    Attends Religious Services: More than 4 times per year    Active Member of [employer] or Organizations: No    Attends Music therapist: Never    Marital Status: Married    Tobacco Counseling Counseling given: Not Answered   Clinical Intake:  Pre-visit preparation completed: Yes  Pain : No/denies pain     BMI - recorded: 29.21 Nutritional Status: BMI 25 -29 Overweight Nutritional Risks: None Diabetes: Yes CBG done?: No Did pt. bring in CBG monitor from home?: No  How often do you need to have someone help you when you read instructions, pamphlets, or other written materials from your doctor or pharmacy?: 1 - Never  Diabetic?Nutrition Risk Assessment:  Has the patient had any N/V/D within the last 2 months?  No  Does the patient have any non-healing wounds?  No  Has the patient had any unintentional weight loss or weight gain?  No   Diabetes:  Is the patient diabetic?  Yes  If diabetic, was a CBG obtained today?  No  Did the patient bring in their glucometer from home?  No  How often do you monitor your CBG's? N/a.   Financial Strains and Diabetes Management:  Are you having any financial strains with the device, your supplies or your medication? No .  Does the patient want to be seen by Chronic Care Management for  management of their diabetes?  No  Would the patient like to be referred to a Nutritionist or for Diabetic Management?  No   Diabetic Exams:  Diabetic Eye Exam: Completed 01/11/22 Diabetic Foot Exam: Completed 10/19/21   Interpreter Needed?: No  Information entered by :: Charlott Rakes, LPN   Activities of Daily Living    06/01/2022    8:41 AM  In your present state of health, do you have any difficulty performing the following activities:  Hearing? 0  Vision? 0  Difficulty concentrating or  making decisions? 0  Walking or climbing stairs? 0  Dressing or bathing? 0  Doing errands, shopping? 0  Preparing Food and eating ? N  Using the Toilet? N  In the past six months, have you accidently leaked urine? N  Do you have problems with loss of bowel control? N  Managing your Medications? N  Managing your Finances? N  Housekeeping or managing your Housekeeping? N    Patient Care Team: Leamon Arnt, MD as PCP - General (Family Medicine) Ashok Pall, MD as Consulting Physician (Neurosurgery) Marygrace Drought, MD as Consulting Physician (Ophthalmology) Edythe Clarity, [facility] (Pharmacist)  Indicate any recent Medical Services you may have received from other than [facility] providers in the past year (date may be approximate).     Assessment:   This is a routine wellness examination for Holly Riley.  Hearing/Vision screen Hearing Screening - Comments:: Pt denies any hearing issues  Vision Screening - Comments:: Pt follows up with Dr Satira Sark for annual eye exams   Dietary issues and exercise activities discussed: Current Exercise Habits: Home exercise routine, Type of exercise: Other - see comments;walking, Time (Minutes): 60, Frequency (Times/Week): 7, Weekly Exercise (Minutes/Week): 420   Goals Addressed             This Visit's Progress    Patient Stated       Stay active        Depression Screen    06/01/2022    8:38 AM 05/12/2022    8:52 AM 10/19/2021    9:16 AM  05/29/2021    8:51 AM 10/21/2020    9:06 AM 05/23/2020    8:54 AM 10/18/2019    9:31 AM  PHQ 2/9 Scores  PHQ - 2 Score 0 0 0 0 0 0 0    Fall Risk    06/01/2022    8:41 AM 05/12/2022    8:51 AM 10/19/2021    9:16 AM 05/29/2021    8:53 AM 10/21/2020    9:06 AM  Fall Risk   Falls in the past year? 0 0 0 0 0  Number falls in past yr: 0 0 0 0 0  Injury with Fall? 0 0 0 0 0  Risk for fall due to : Impaired vision No Fall Risks No Fall Risks Impaired vision   Follow up Falls prevention discussed Falls evaluation completed Falls evaluation completed Falls prevention discussed     FALL RISK PREVENTION PERTAINING TO THE HOME:  Any stairs in or around the home? Yes  If so, are there any without handrails? No  Home free of loose throw rugs in walkways, pet beds, electrical cords, etc? Yes  Adequate lighting in your home to reduce risk of falls? Yes   ASSISTIVE DEVICES UTILIZED TO PREVENT FALLS:  Life alert? No  Use of a cane, walker or w/c? No  Grab bars in the bathroom? Yes  Shower chair or bench in shower? No  Elevated toilet seat or a handicapped toilet? No   TIMED UP AND GO:  Was the test performed? No .   Cognitive Function:        06/01/2022    8:41 AM 05/23/2020    9:00 AM  6CIT Screen  What Year? 0 points 0 points  What month? 0 points 0 points  What time? 0 points   Count back from 20 0 points 0 points  Months in reverse 0 points 0 points  Repeat phrase 0 points 0 points  Total Score 0 points  Immunizations Immunization History  Administered Date(s) Administered   DTaP 04/27/2006   Fluad Quad(high Dose 65+) 12/29/2020   Hepatitis B, ADULT 04/28/1991   Hepatitis B, PED/ADOLESCENT 04/28/1991   Influenza Split 12/26/2007   Influenza, Quadrivalent, Recombinant, Inj, Pf 01/22/2013   Influenza, Seasonal, Injecte, Preservative Fre 12/13/2013, 11/20/2014   Influenza,inj,Quad PF,6+ Mos 12/17/2015, 01/07/2019   Influenza-Unspecified 12/14/2016, 02/08/2020    Pneumococcal Conjugate-13 11/23/2018   Pneumococcal Polysaccharide-23 08/27/2014, 07/04/2020   Tdap 10/19/2014   Zoster Recombinat (Shingrix) 11/25/2020, 05/12/2021    TDAP status: Up to date  Flu Vaccine status: Declined, Education has been provided regarding the importance of this vaccine but patient still declined. Advised may receive this vaccine at local pharmacy or Health Dept. Aware to provide a copy of the vaccination record if obtained from local pharmacy or Health Dept. Verbalized acceptance and understanding.  Pneumococcal vaccine status: Up to date  Covid-19 vaccine status: Declined, Education has been provided regarding the importance of this vaccine but patient still declined. Advised may receive this vaccine at local pharmacy or Health Dept.or vaccine clinic. Aware to provide a copy of the vaccination record if obtained from local pharmacy or Health Dept. Verbalized acceptance and understanding.  Qualifies for Shingles Vaccine? Yes   Zostavax completed Yes   Shingrix Completed?: Yes  Screening Tests Health Maintenance  Topic Date Due   COVID-19 Vaccine (1) Never done   INFLUENZA VACCINE  09/30/2022   Diabetic kidney evaluation - eGFR measurement  10/20/2022   FOOT EXAM  10/20/2022   HEMOGLOBIN A1C  11/12/2022   MAMMOGRAM  11/27/2022   OPHTHALMOLOGY EXAM  01/12/2023   Diabetic kidney evaluation - Urine ACR  05/12/2023   Medicare Annual Wellness (AWV)  06/01/2023   DEXA SCAN  11/10/2023   DTaP/Tdap/Td (3 - Td or Tdap) 10/18/2024   COLONOSCOPY (Pts 45-71yrs Insurance coverage will need to be confirmed)  09/27/2029   Pneumonia Vaccine 17+ Years old  Completed   Hepatitis C Screening  Completed   Zoster Vaccines- Shingrix  Completed   HPV VACCINES  Aged Out    Health Maintenance  Health Maintenance Due  Topic Date Due   COVID-19 Vaccine (1) Never done    Colorectal cancer screening: Type of screening: Colonoscopy. Completed 09/28/19. Repeat every 10  years  Mammogram status: Completed 11/26/21. Repeat every year  Bone Density status: Completed 11/10/18. Results reflect: Bone density results: NORMAL. Repeat every 5 years.  Additional Screening:  Hepatitis C Screening: Completed 01/28/15  Vision Screening: Recommended annual ophthalmology exams for early detection of glaucoma and other disorders of the eye. Is the patient up to date with their annual eye exam?  Yes  Who is the provider or what is the name of the office in which the patient attends annual eye exams? Dr Satira Sark  If pt is not established with a provider, would they like to be referred to a provider to establish care? No .   Dental Screening: Recommended annual dental exams for proper oral hygiene  Community Resource Referral / Chronic Care Management: CRR required this visit?  No   CCM required this visit?  No      Plan:     I have personally reviewed and noted the following in the patient's chart:   Medical and social history Use of alcohol, tobacco or illicit drugs  Current medications and supplements including opioid prescriptions. Patient is not currently taking opioid prescriptions. Functional ability and status Nutritional status Physical activity Advanced directives List of other physicians Hospitalizations,  surgeries, and ER visits in previous 12 months Vitals Screenings to include cognitive, depression, and falls Referrals and appointments  In addition, I have reviewed and discussed with patient certain preventive protocols, quality metrics, and best practice recommendations. A written personalized care plan for preventive services as well as general preventive health recommendations were provided to patient.     Willette Brace, LPN   [id number]   Nurse Notes: one

## 2022-06-19 ENCOUNTER — Other Ambulatory Visit: Payer: Self-pay | Admitting: Family Medicine

## 2022-06-21 DIAGNOSIS — M65311 Trigger thumb, right thumb: Secondary | ICD-10-CM | POA: Diagnosis not present

## 2022-08-09 ENCOUNTER — Other Ambulatory Visit: Payer: Self-pay | Admitting: Orthopedic Surgery

## 2022-09-20 ENCOUNTER — Other Ambulatory Visit: Payer: Self-pay

## 2022-09-20 ENCOUNTER — Encounter (HOSPITAL_BASED_OUTPATIENT_CLINIC_OR_DEPARTMENT_OTHER): Payer: Self-pay | Admitting: Orthopedic Surgery

## 2022-09-22 ENCOUNTER — Encounter (HOSPITAL_BASED_OUTPATIENT_CLINIC_OR_DEPARTMENT_OTHER)
Admission: RE | Admit: 2022-09-22 | Discharge: 2022-09-22 | Disposition: A | Discharge status: Home / Self Care | Payer: Medicare HMO | Source: Ambulatory Visit | Attending: Orthopedic Surgery | Admitting: Orthopedic Surgery

## 2022-09-22 DIAGNOSIS — Z01818 Encounter for other preprocedural examination: Secondary | ICD-10-CM | POA: Diagnosis not present

## 2022-09-22 NOTE — Progress Notes (Signed)
Reviewed EKG with Dr. Renold Don, no concerns for scheduled surgery.

## 2022-09-22 NOTE — Progress Notes (Signed)

## 2022-09-27 ENCOUNTER — Other Ambulatory Visit: Payer: Self-pay

## 2022-09-27 ENCOUNTER — Ambulatory Visit (HOSPITAL_BASED_OUTPATIENT_CLINIC_OR_DEPARTMENT_OTHER)
Admission: RE | Admit: 2022-09-27 | Discharge: 2022-09-27 | Disposition: A | Discharge status: Home / Self Care | Payer: Medicare HMO | Attending: Orthopedic Surgery | Admitting: Orthopedic Surgery

## 2022-09-27 ENCOUNTER — Encounter (HOSPITAL_BASED_OUTPATIENT_CLINIC_OR_DEPARTMENT_OTHER)
Admission: RE | Disposition: A | Discharge status: Home / Self Care | Payer: Self-pay | Source: Home / Self Care | Attending: Orthopedic Surgery

## 2022-09-27 ENCOUNTER — Ambulatory Visit (HOSPITAL_BASED_OUTPATIENT_CLINIC_OR_DEPARTMENT_OTHER): Payer: Medicare HMO | Admitting: Anesthesiology

## 2022-09-27 ENCOUNTER — Encounter (HOSPITAL_BASED_OUTPATIENT_CLINIC_OR_DEPARTMENT_OTHER): Payer: Self-pay | Admitting: Orthopedic Surgery

## 2022-09-27 DIAGNOSIS — M65311 Trigger thumb, right thumb: Secondary | ICD-10-CM | POA: Insufficient documentation

## 2022-09-27 DIAGNOSIS — Z79899 Other long term (current) drug therapy: Secondary | ICD-10-CM | POA: Diagnosis not present

## 2022-09-27 DIAGNOSIS — E785 Hyperlipidemia, unspecified: Secondary | ICD-10-CM | POA: Insufficient documentation

## 2022-09-27 DIAGNOSIS — I10 Essential (primary) hypertension: Secondary | ICD-10-CM | POA: Diagnosis not present

## 2022-09-27 DIAGNOSIS — E039 Hypothyroidism, unspecified: Secondary | ICD-10-CM | POA: Diagnosis not present

## 2022-09-27 DIAGNOSIS — E119 Type 2 diabetes mellitus without complications: Secondary | ICD-10-CM | POA: Insufficient documentation

## 2022-09-27 HISTORY — DX: Essential (primary) hypertension: I10

## 2022-09-27 HISTORY — PX: TRIGGER FINGER RELEASE: SHX641

## 2022-09-27 SURGERY — RELEASE, A1 PULLEY, FOR TRIGGER FINGER
Anesthesia: Monitor Anesthesia Care | Site: Thumb | Laterality: Right

## 2022-09-27 MED ORDER — ACETAMINOPHEN 500 MG PO TABS
ORAL_TABLET | ORAL | Status: AC
  Filled 2022-09-27: qty 2

## 2022-09-27 MED ORDER — MIDAZOLAM HCL 2 MG/2ML IJ SOLN
INTRAMUSCULAR | Status: AC
  Filled 2022-09-27: qty 2

## 2022-09-27 MED ORDER — DEXMEDETOMIDINE HCL IN NACL 80 MCG/20ML IV SOLN
INTRAVENOUS | Status: DC | PRN
Start: 2022-09-27 — End: 2022-09-27
  Administered 2022-09-27: 8 ug via INTRAVENOUS

## 2022-09-27 MED ORDER — MIDAZOLAM HCL 5 MG/5ML IJ SOLN
INTRAMUSCULAR | Status: DC | PRN
  Administered 2022-09-27: 2 mg via INTRAVENOUS

## 2022-09-27 MED ORDER — OXYCODONE HCL 5 MG PO TABS: 5.0000 mg | ORAL_TABLET | Freq: Once | ORAL | Status: DC | PRN

## 2022-09-27 MED ORDER — 0.9 % SODIUM CHLORIDE (POUR BTL) OPTIME
TOPICAL | Status: DC | PRN
Start: 2022-09-27 — End: 2022-09-27
  Administered 2022-09-27: 1000 mL

## 2022-09-27 MED ORDER — CEFAZOLIN SODIUM-DEXTROSE 2-4 GM/100ML-% IV SOLN
INTRAVENOUS | Status: AC
  Filled 2022-09-27: qty 100

## 2022-09-27 MED ORDER — ACETAMINOPHEN 500 MG PO TABS
1000.0000 mg | ORAL_TABLET | Freq: Once | ORAL | Status: AC
  Administered 2022-09-27: 1000 mg via ORAL

## 2022-09-27 MED ORDER — FENTANYL CITRATE (PF) 100 MCG/2ML IJ SOLN: 25.0000 ug | INTRAMUSCULAR | Status: DC | PRN

## 2022-09-27 MED ORDER — BUPIVACAINE HCL (PF) 0.25 % IJ SOLN
INTRAMUSCULAR | Status: DC | PRN
  Administered 2022-09-27: 9 mL

## 2022-09-27 MED ORDER — TRAMADOL HCL 50 MG PO TABS: ORAL_TABLET | ORAL | 0 refills | Status: DC

## 2022-09-27 MED ORDER — PROPOFOL 500 MG/50ML IV EMUL
INTRAVENOUS | Status: DC | PRN
  Administered 2022-09-27: 100 ug/kg/min via INTRAVENOUS

## 2022-09-27 MED ORDER — CEFAZOLIN SODIUM-DEXTROSE 2-4 GM/100ML-% IV SOLN
2.0000 g | INTRAVENOUS | Status: AC
  Administered 2022-09-27: 2 g via INTRAVENOUS

## 2022-09-27 MED ORDER — LACTATED RINGERS IV SOLN: INTRAVENOUS | Status: DC

## 2022-09-27 MED ORDER — OXYCODONE HCL 5 MG/5ML PO SOLN: 5.0000 mg | Freq: Once | ORAL | Status: DC | PRN

## 2022-09-27 MED ORDER — PROPOFOL 10 MG/ML IV BOLUS
INTRAVENOUS | Status: DC | PRN
  Administered 2022-09-27: 50 mg via INTRAVENOUS

## 2022-09-27 MED ORDER — AMISULPRIDE (ANTIEMETIC) 5 MG/2ML IV SOLN: 10.0000 mg | Freq: Once | INTRAVENOUS | Status: DC | PRN

## 2022-09-27 MED ORDER — ONDANSETRON HCL 4 MG/2ML IJ SOLN
INTRAMUSCULAR | Status: DC | PRN
  Administered 2022-09-27: 4 mg via INTRAVENOUS

## 2022-09-27 SURGICAL SUPPLY — 31 items
APL PRP STRL LF DISP 70% ISPRP (MISCELLANEOUS) ×1
BLADE SURG 15 STRL LF DISP TIS (BLADE) ×2 IMPLANT
BLADE SURG 15 STRL SS (BLADE) ×2
BNDG CMPR 5X2 CHSV 1 LYR STRL (GAUZE/BANDAGES/DRESSINGS) ×1
BNDG CMPR 9X4 STRL LF SNTH (GAUZE/BANDAGES/DRESSINGS)
BNDG COHESIVE 2X5 TAN ST LF (GAUZE/BANDAGES/DRESSINGS) ×1 IMPLANT
BNDG ESMARK 4X9 LF (GAUZE/BANDAGES/DRESSINGS) IMPLANT
CHLORAPREP W/TINT 26 (MISCELLANEOUS) ×1 IMPLANT
CORD BIPOLAR FORCEPS 12FT (ELECTRODE) ×1 IMPLANT
COVER BACK TABLE 60X90IN (DRAPES) ×1 IMPLANT
COVER MAYO STAND STRL (DRAPES) ×1 IMPLANT
CUFF TOURN SGL QUICK 18X4 (TOURNIQUET CUFF) ×1 IMPLANT
DRAPE EXTREMITY T 121X128X90 (DISPOSABLE) ×1 IMPLANT
DRAPE SURG 17X23 STRL (DRAPES) ×1 IMPLANT
GAUZE SPONGE 4X4 12PLY STRL (GAUZE/BANDAGES/DRESSINGS) ×1 IMPLANT
GAUZE XEROFORM 1X8 LF (GAUZE/BANDAGES/DRESSINGS) ×1 IMPLANT
GLOVE BIO SURGEON STRL SZ7.5 (GLOVE) ×1 IMPLANT
GLOVE BIOGEL PI IND STRL 8 (GLOVE) ×1 IMPLANT
GOWN STRL REUS W/ TWL LRG LVL3 (GOWN DISPOSABLE) ×1 IMPLANT
GOWN STRL REUS W/TWL LRG LVL3 (GOWN DISPOSABLE) ×1
GOWN STRL REUS W/TWL XL LVL3 (GOWN DISPOSABLE) ×1 IMPLANT
NDL HYPO 25X1 1.5 SAFETY (NEEDLE) ×1 IMPLANT
NEEDLE HYPO 25X1 1.5 SAFETY (NEEDLE) ×1 IMPLANT
NS IRRIG 1000ML POUR BTL (IV SOLUTION) ×1 IMPLANT
PACK BASIN DAY SURGERY FS (CUSTOM PROCEDURE TRAY) ×1 IMPLANT
STOCKINETTE 4X48 STRL (DRAPES) ×1 IMPLANT
SUT ETHILON 4 0 PS 2 18 (SUTURE) ×1 IMPLANT
SYR BULB EAR ULCER 3OZ GRN STR (SYRINGE) ×1 IMPLANT
SYR CONTROL 10ML LL (SYRINGE) ×1 IMPLANT
TOWEL GREEN STERILE FF (TOWEL DISPOSABLE) ×2 IMPLANT
UNDERPAD 30X36 HEAVY ABSORB (UNDERPADS AND DIAPERS) ×1 IMPLANT

## 2022-09-27 NOTE — Discharge Instructions (Addendum)
[facility] Instructions Hand Surgery  Wound Care: Keep your hand elevated above the level of your heart.  Do not allow it to dangle by your side.  Keep the dressing dry and do not remove it unless your doctor advises you to do so.  He will usually change it at the time of your post-op visit.  Moving your fingers is advised to stimulate circulation but will depend on the site of your surgery.  If you have a splint applied, your doctor will advise you regarding movement.  Activity: Do not drive or operate machinery today.  Rest today and then you may return to your normal activity and work as indicated by your physician.  Diet:  Drink liquids today or eat a light diet.  You may resume a regular diet tomorrow.    General expectations: Pain for two to three days. Fingers may become slightly swollen.  Call your doctor if any of the following occur: Severe pain not relieved by pain medication. Elevated temperature. Dressing soaked with blood. Inability to move fingers. White or bluish color to fingers.   No Tylenol until after 5:20pm today if needed  Post Anesthesia Home Care Instructions  Activity: Get plenty of rest for the remainder of the day. A responsible individual must stay with you for 24 hours following the procedure.  For the next 24 hours, DO NOT: -Drive a car -Advertising copywriter -Drink alcoholic beverages -Take any medication unless instructed by your physician -Make any legal decisions or sign important papers.  Meals: Start with liquid foods such as gelatin or soup. Progress to regular foods as tolerated. Avoid greasy, spicy, heavy foods. If nausea and/or vomiting occur, drink only clear liquids until the nausea and/or vomiting subsides. Call your physician if vomiting continues.  Special Instructions/Symptoms: Your throat may feel dry or sore from the anesthesia or the breathing tube placed in your throat during surgery. If this causes discomfort, gargle with warm  salt water. The discomfort should disappear within 24 hours.  If you had a scopolamine patch placed behind your ear for the management of post- operative nausea and/or vomiting:  1. The medication in the patch is effective for 72 hours, after which it should be removed.  Wrap patch in a tissue and discard in the trash. Wash hands thoroughly with soap and water. 2. You may remove the patch earlier than 72 hours if you experience unpleasant side effects which may include dry mouth, dizziness or visual disturbances. 3. Avoid touching the patch. Wash your hands with soap and water after contact with the patch.

## 2022-09-27 NOTE — Anesthesia Postprocedure Evaluation (Signed)
Anesthesia Post Note  Patient: Holly Riley  Procedure(s) Performed: RELEASE TRIGGER FINGER/A-1 PULLEY RIGHT THUMB (Right: Thumb)     Patient location during evaluation: PACU Anesthesia Type: MAC Level of consciousness: awake Pain management: pain level controlled Vital Signs Assessment: post-procedure vital signs reviewed and stable Respiratory status: spontaneous breathing, nonlabored ventilation and respiratory function stable Cardiovascular status: stable and blood pressure returned to baseline Postop Assessment: no apparent nausea or vomiting Anesthetic complications: no   No notable events documented.  Last Vitals:  Vitals:   09/27/22 1345 09/27/22 1400  BP: (!) 96/46 (!) 111/58  Pulse: 69 68  Resp: 17 15  Temp:    SpO2: 99% 95%    Last Pain:  Vitals:   09/27/22 1400  TempSrc:   PainSc: 0-No pain                 Linton Rump

## 2022-09-27 NOTE — Anesthesia Preprocedure Evaluation (Addendum)
Anesthesia Evaluation  Patient identified by MRN, date of birth, ID band Patient awake    Reviewed: Allergy & Precautions, NPO status , Patient's Chart, lab work & pertinent test results  History of Anesthesia Complications Negative for: history of anesthetic complications  Airway Mallampati: II  TM Distance: >3 FB Neck ROM: Limited    Dental  (+) Dental Advisory Given   Pulmonary neg pulmonary ROS   Pulmonary exam normal breath sounds clear to auscultation       Cardiovascular hypertension (lisinopril), Pt. on medications (-) angina (-) Past MI, (-) Cardiac Stents and (-) CABG + dysrhythmias (incomplete RBBB)  Rhythm:Regular Rate:Normal  HLD  Low-risk stress test 10/06/2015   Neuro/Psych neg Seizures S/p cervical fusion and lumbar laminectomy  Neuromuscular disease (spinal stenosis)    GI/Hepatic negative GI ROS, Neg liver ROS,,,  Endo/Other  diabetes, Type 2Hypothyroidism    Renal/GU negative Renal ROS     Musculoskeletal  (+) Arthritis , Osteoarthritis,    Abdominal   Peds  Hematology negative hematology ROS (+)   Anesthesia Other Findings   Reproductive/Obstetrics                             Anesthesia Physical Anesthesia Plan  ASA: 2  Anesthesia Plan: MAC   Post-op Pain Management: Tylenol PO (pre-op)*   Induction: Intravenous  PONV Risk Score and Plan: 2 and Ondansetron, Dexamethasone, Propofol infusion and Treatment may vary due to age or medical condition  Airway Management Planned: Natural Airway and Simple Face Mask  Additional Equipment:   Intra-op Plan:   Post-operative Plan:   Informed Consent: I have reviewed the patients History and Physical, chart, labs and discussed the procedure including the risks, benefits and alternatives for the proposed anesthesia with the patient or authorized representative who has indicated his/her understanding and acceptance.      Dental advisory given  Plan Discussed with: CRNA and Anesthesiologist  Anesthesia Plan Comments: (Discussed with patient risks of MAC including, but not limited to, minor pain or discomfort, hearing people in the room, and possible need for backup general anesthesia. Risks for general anesthesia also discussed including, but not limited to, sore throat, hoarse voice, chipped/damaged teeth, injury to vocal cords, nausea and vomiting, allergic reactions, lung infection, heart attack, stroke, and death. All questions answered. )        Anesthesia Quick Evaluation

## 2022-09-27 NOTE — Op Note (Signed)
09/27/2022 [facility] OPERATIVE REPORT   PREOPERATIVE DIAGNOSIS: Right trigger thumb.  POSTOPERATIVE DIAGNOSIS:  Right trigger thumb.  PROCEDURE: Right trigger thumb release.  SURGEON:  Betha Loa, MD  ASSISTANT:  None.  ANESTHESIA:  Local with sedation.  IV FLUIDS:  Per anesthesia flow sheet.  ESTIMATED BLOOD LOSS:  Minimal.  COMPLICATIONS:  None.  SPECIMENS:  None.  TOURNIQUET TIME:  Total Tourniquet Time Documented: Forearm (Right) - 11 minutes Total: Forearm (Right) - 11 minutes   DISPOSITION:  Stable to PACU.  LOCATION: [facility]  INDICATIONS: Holly Riley is a 67 y.o. female with triggering right thumb.  This has been injected without lasting resolution.  She wishes to proceed with surgical trigger release.  Risks, benefits and alternatives of surgery were discussed including the risk of blood loss, infection, damage to nerves, vessels, tendons, ligaments, bone, failure of surgery, need for additional surgery, complications with wound healing, continued pain, continued triggering and need for repeat surgery.  She voiced understanding of these risks and elected to proceed.  OPERATIVE COURSE:  After being identified preoperatively by myself, the patient and I agreed upon the procedure and site of procedure.  The surgical site was marked. Surgical consent had been signed. She was given IV Ancef as preoperative antibiotic prophylaxis. She was transported to the operating room and placed on the operating room table in supine position with the Right upper extremity on an arm board. Sedation was induced by the anesthesiologist.  A surgical pause was performed between surgeons, anesthesia, and operating room staff, and all were in agreement as to the patient, procedure, and site of procedure. A digital block was performed with 0.25% plain marcaine.  The Right upper extremity was prepped and draped in normal sterile orthopedic fashion. A surgical  pause was performed between surgeons, anesthesia, and operating room staff, and all were in agreement as to the patient, procedure, and site of procedure.  Tourniquet at the proximal aspect of the forearm was inflated to 250 mmHg after exsanguination of the arm with an Esmarch bandage.  An incision was made transversely at the MP flexion crease of the thumb.  This was made through the skin only.  Spreading technique was used.  The radial and ulnar digital nerves were identified and were protected throughout the case. The flexor sheath was identified.  The A1 pulley was identified.  It was sharply incised.  It was released in its entirety.  Care was taken to avoid any release of the oblique pulley. The thumb was placed through a range of motion, there was noted to be no catching.  The wound was copiously irrigated with sterile saline. It was then closed with 4-0 nylon in a horizontal mattress fashion.  The wound was injected with  0.25% plain Marcaine to aid in postoperative analgesia.  It was then dressed with sterile Xeroform, 4x4s, and wrapped lightly with a Coban dressing.  Tourniquet was deflated at 11 minutes.  The fingertips were pink with brisk capillary refill after deflation of the tourniquet.  The operative drapes were broken down and the patient was awoken from anesthesia safely.  She was transferred back to the stretcher and taken to the PACU in stable condition.   I will see her back in the office in 1 week for postoperative followup.  I will give her a prescription for Tramadol 50 mg 1-2 tabs PO q6 hours prn pain, dispense # 20.    Betha Loa, MD Electronically signed, 09/27/22

## 2022-09-27 NOTE — Transfer of Care (Signed)
Immediate Anesthesia Transfer of Care Note  Patient: Holly Riley  Procedure(s) Performed: RELEASE TRIGGER FINGER/A-1 PULLEY RIGHT THUMB (Right: Thumb)  Patient Location: PACU  Anesthesia Type:MAC  Level of Consciousness: awake, alert , and oriented  Airway & Oxygen Therapy: Patient Spontanous Breathing and Patient connected to face mask oxygen  Post-op Assessment: Report given to RN and Post -op Vital signs reviewed and stable  Post vital signs: Reviewed and stable  Last Vitals:  Vitals Value Taken Time  BP 96/37 09/27/22 1342  Temp    Pulse 70 09/27/22 1344  Resp 18 09/27/22 1344  SpO2 99 % 09/27/22 1344  Vitals shown include unfiled device data.  Last Pain:  Vitals:   09/27/22 1116  TempSrc: Oral  PainSc: 0-No pain      Patients Stated Pain Goal: 8 (09/27/22 1116)  Complications: No notable events documented.

## 2022-09-27 NOTE — H&P (Signed)
Holly Riley is an 67 y.o. female.   Chief Complaint: trigger digit HPI: 67 yo female with triggering right thumb.  This has been injected previously without lasting resolution.  She wishes to proceed with surgical release.  Allergies:  Allergies  Allergen Reactions   Morphine And Codeine Other (See Comments)    hallucinations hallucinations   Statins Other (See Comments)    myalgis   Dilaudid [Hydromorphone]     hallucentations   Percocet [Oxycodone-Acetaminophen] Itching and Other (See Comments)    tachycardia   Vicodin [Hydrocodone-Acetaminophen]    Zetia [Ezetimibe] Other (See Comments)    myalgias    Past Medical History:  Diagnosis Date   Arthritis    HTN (hypertension)    Hyperlipidemia    Spinal stenosis    NS - early, MRI   Thyroid disease     Past Surgical History:  Procedure Laterality Date   ABDOMINAL HYSTERECTOMY     CARPAL TUNNEL RELEASE Right    CERVICAL FUSION     [place]   KNEE SURGERY Left    LUMBAR LAMINECTOMY     x 2, L4, L5, [place]   SPINE SURGERY     TONSILLECTOMY AND ADENOIDECTOMY      Family History: Family History  Problem Relation Age of Onset   Cancer Mother    Hyperlipidemia Mother    Cancer Father    Diabetes Father    Heart disease Father    Hyperlipidemia Father    Cancer Sister    Diabetes Sister    Hyperlipidemia Sister    Diabetes Brother    Hyperlipidemia Sister     Social History:   reports that she has never smoked. She has never used smokeless tobacco. She reports that she does not drink alcohol and does not use drugs.  Medications: Medications Prior to Admission  Medication Sig Dispense Refill   ASHWAGANDHA PO      Cholecalciferol (VITAMIN D3) 50 MCG (2000 UT) capsule Take 2,000 Units by mouth daily.      Chromium-Cinnamon [phone] MCG-MG CAPS      Coenzyme Q10 (CO Q 10 PO)      Flaxseed, Linseed, (FLAX SEED OIL PO) Take 1 tablet by mouth daily.     Garlic 10 MG CAPS Take 10 mg by mouth daily.     Krill  Oil 500 MG CAPS      levothyroxine (SYNTHROID) 88 MCG tablet TAKE 1 TABLET(88 MCG) BY MOUTH DAILY BEFORE BREAKFAST 90 tablet 3   lisinopril (ZESTRIL) 10 MG tablet Take 1 tablet (10 mg total) by mouth daily. 90 tablet 3   Omega-3 Fatty Acids (FISH OIL CONCENTRATE) 1000 MG CAPS Take 1,000 mg by mouth daily.     Red Yeast Rice 600 MG TABS      azelastine (ASTELIN) 0.1 % nasal spray Place 1 spray into both nostrils 2 (two) times daily. Use in each nostril as directed 30 mL 11    No results found for this or any previous visit (from the past 48 hour(s)).  No results found.    Blood pressure 116/69, pulse 91, temperature 97.8 F (36.6 C), temperature source Oral, resp. rate 18, height 5\' 6"  (1.676 m), weight 82.1 kg, SpO2 100%.  General appearance: alert, cooperative, and appears stated age Head: Normocephalic, without obvious abnormality, atraumatic Neck: supple, symmetrical, trachea midline Extremities: Intact sensation and capillary refill all digits.  +epl/fpl/io.  No wounds.  Pulses: 2+ and symmetric Skin: Skin color, texture, turgor normal. No rashes or  lesions Neurologic: Grossly normal Incision/Wound: none  Assessment/Plan Right trigger thumb.  Non operative and operative treatment options have been discussed with the patient and patient wishes to proceed with operative treatment. Risks, benefits and alternatives of surgery were discussed including risks of blood loss, infection, damage to nerves/vessels/tendons/ligament/bone, failure of surgery, need for additional surgery, complication with wound healing, stiffness.  She voiced understanding of these risks and elected to proceed.    Betha Loa 09/27/2022, 12:58 PM

## 2022-09-28 ENCOUNTER — Encounter (HOSPITAL_BASED_OUTPATIENT_CLINIC_OR_DEPARTMENT_OTHER): Payer: Self-pay | Admitting: Orthopedic Surgery

## 2022-10-04 DIAGNOSIS — M65311 Trigger thumb, right thumb: Secondary | ICD-10-CM | POA: Diagnosis not present

## 2022-10-11 ENCOUNTER — Encounter: Payer: Self-pay | Admitting: Gastroenterology

## 2022-10-11 DIAGNOSIS — M65311 Trigger thumb, right thumb: Secondary | ICD-10-CM | POA: Diagnosis not present

## 2022-10-14 ENCOUNTER — Encounter (INDEPENDENT_AMBULATORY_CARE_PROVIDER_SITE_OTHER): Payer: Self-pay

## 2022-11-22 ENCOUNTER — Ambulatory Visit: Payer: Medicare HMO | Admitting: Family Medicine

## 2022-11-22 ENCOUNTER — Encounter: Payer: Self-pay | Admitting: Family Medicine

## 2022-11-22 VITALS — BP 138/82 | HR 85 | Temp 98.0°F | Ht 66.0 in | Wt 184.2 lb

## 2022-11-22 DIAGNOSIS — E1169 Type 2 diabetes mellitus with other specified complication: Secondary | ICD-10-CM | POA: Diagnosis not present

## 2022-11-22 DIAGNOSIS — Z0000 Encounter for general adult medical examination without abnormal findings: Secondary | ICD-10-CM

## 2022-11-22 DIAGNOSIS — E559 Vitamin D deficiency, unspecified: Secondary | ICD-10-CM

## 2022-11-22 DIAGNOSIS — E119 Type 2 diabetes mellitus without complications: Secondary | ICD-10-CM

## 2022-11-22 DIAGNOSIS — E782 Mixed hyperlipidemia: Secondary | ICD-10-CM | POA: Diagnosis not present

## 2022-11-22 DIAGNOSIS — Z0001 Encounter for general adult medical examination with abnormal findings: Secondary | ICD-10-CM

## 2022-11-22 DIAGNOSIS — Z789 Other specified health status: Secondary | ICD-10-CM

## 2022-11-22 DIAGNOSIS — E039 Hypothyroidism, unspecified: Secondary | ICD-10-CM

## 2022-11-22 LAB — COMPREHENSIVE METABOLIC PANEL
ALT: 17 U/L (ref 0–35)
AST: 17 U/L (ref 0–37)
Albumin: 3.7 g/dL (ref 3.5–5.2)
Alkaline Phosphatase: 78 U/L (ref 39–117)
BUN: 17 mg/dL (ref 6–23)
CO2: 26 mEq/L (ref 19–32)
Calcium: 9.2 mg/dL (ref 8.4–10.5)
Chloride: 106 mEq/L (ref 96–112)
Creatinine, Ser: 0.82 mg/dL (ref 0.40–1.20)
GFR: 73.1 mL/min (ref >60.00)
Glucose, Bld: 134 mg/dL — ABNORMAL HIGH (ref 70–99)
Potassium: 4 mEq/L (ref 3.5–5.1)
Sodium: 140 mEq/L (ref 135–145)
Total Bilirubin: 0.3 mg/dL (ref 0.2–1.2)
Total Protein: 6.6 g/dL (ref 6.0–8.3)

## 2022-11-22 LAB — CBC WITH DIFFERENTIAL/PLATELET
Basophils Absolute: 0.1 10*3/uL (ref 0.0–0.1)
Basophils Relative: 1.3 % (ref 0.0–3.0)
Eosinophils Absolute: 0.2 10*3/uL (ref 0.0–0.7)
Eosinophils Relative: 2.4 % (ref 0.0–5.0)
HCT: 45.1 % (ref 36.0–46.0)
Hemoglobin: 14.9 g/dL (ref 12.0–15.0)
Lymphocytes Relative: 27.1 % (ref 12.0–46.0)
Lymphs Abs: 1.8 10*3/uL (ref 0.7–4.0)
MCHC: 33 g/dL (ref 30.0–36.0)
MCV: 97.1 fl (ref 78.0–100.0)
Monocytes Absolute: 0.5 10*3/uL (ref 0.1–1.0)
Monocytes Relative: 8.2 % (ref 3.0–12.0)
Neutro Abs: 4.1 10*3/uL (ref 1.4–7.7)
Neutrophils Relative %: 61 % (ref 43.0–77.0)
Platelets: 276 10*3/uL (ref 150.0–400.0)
RBC: 4.65 Mil/uL (ref 3.87–5.11)
RDW: 12.9 % (ref 11.5–15.5)
WBC: 6.7 10*3/uL (ref 4.0–10.5)

## 2022-11-22 LAB — LIPID PANEL
Cholesterol: 241 mg/dL — ABNORMAL HIGH (ref 0–200)
HDL: 79.4 mg/dL (ref >39.00)
LDL Cholesterol: 148 mg/dL — ABNORMAL HIGH (ref 0–99)
NonHDL: 161.69
Total CHOL/HDL Ratio: 3
Triglycerides: 69 mg/dL (ref 0.0–149.0)
VLDL: 13.8 mg/dL (ref 0.0–40.0)

## 2022-11-22 LAB — TSH: TSH: 1.81 u[IU]/mL (ref 0.35–5.50)

## 2022-11-22 LAB — VITAMIN D 25 HYDROXY (VIT D DEFICIENCY, FRACTURES): VITD: 37.65 ng/mL (ref 30.00–100.00)

## 2022-11-22 NOTE — Patient Instructions (Signed)
Please return in 6 months to recheck diabetes and blood pressure.   I will release your lab results to you on your MyChart account with further instructions. You may see the results before I do, but when I review them I will send you a message with my report or have my assistant call you if things need to be discussed. Please reply to my message with any questions. Thank you!   If you have any questions or concerns, please don't hesitate to send me a message via MyChart or call the office at [phone]. Thank you for visiting with [place] today! It's our pleasure caring for you.

## 2022-11-22 NOTE — Progress Notes (Signed)
Subjective  Chief Complaint  Patient presents with   Annual Exam    Pt here for Annual Exam and is currently fasting. Mammogram is scheduled for 12/02/2022   Diabetes    HPI: Holly Riley is a 67 y.o. female who presents to [facility] [place] today for a Female Wellness Visit. She also has the concerns and/or needs as listed above in the chief complaint. These will be addressed in addition to the Health Maintenance Visit.   Wellness Visit: annual visit with health maintenance review and exam without Pap  HM: Doing well.  Just returned from [place] 2 weeks occasion.  Had a wonderful trip.  Mammogram due and scheduled.  Colorectal cancer screening up-to-date.  Declines immunizations.  Chronic disease f/u and/or acute problem visit: (deemed necessary to be done in addition to the wellness visit): Diet-controlled diabetes: No complications.  Eye exam current without retinopathy.  Takes lisinopril for blood pressure.  Blood pressures typically low at home.  No history of hypertension.  No neuropathy.  No symptoms of hyperglycemia. Mildly elevated blood pressure today: Had to take detour due [place] accident [place] the free[place].  A bit stressed.  Home blood pressures 110 over 70s.  Sometimes lower.  Takes lisinopril 10 mg daily. Hyperlipidemia on red yeast rice intolerant to Zetia and statin. Hypothyroidism on 88 mcg of levothyroxine daily.  Feels well.  Dry skin.  Energy is good.  Having some hot flashes at night again. Taking vitamin D daily.  Assessment  1. Encounter for well adult exam with abnormal findings   2. Controlled type 2 diabetes mellitus without complication, without long-term current use of insulin (HCC)   3. Combined hyperlipidemia associated with type 2 diabetes mellitus (HCC)   4. Statin intolerance   5. Acquired hypothyroidism   6. Vitamin D deficiency      Plan  Female Wellness Visit: Age appropriate Health Maintenance and Prevention measures were  discussed with patient. Included topics are cancer screening recommendations, ways to keep healthy (see AVS) including dietary and exercise recommendations, regular eye and dental care, use of seat belts, and avoidance of moderate alcohol use and tobacco use.  Sees Derm for annual screenings BMI: discussed patient's BMI and encouraged positive lifestyle modifications to help get to or maintain a target BMI. HM needs and immunizations were addressed and ordered. See below for orders. See HM and immunization section for updates.  Declines Routine labs and screening tests ordered including cmp, cbc and lipids where appropriate. Discussed recommendations regarding Vit D and calcium supplementation (see AVS)  Chronic disease management visit and/or acute problem visit: Diabetes: Diet controlled.  Check A1c today.  Clinically stable.  Screens current.  On ACE inhibitor for renal protection Prehypertension in the past: Low blood pressure is now on lisinopril 10 mg daily.  Will monitor closely.  May need to cut back to 5 mg daily.  Check renal function electrolytes Hyperlipidemia: Diet controlled and red meats rice intolerant to statins and Zetia. Recheck vitamin D levels. Monitoring hypothyroidism on levothyroxine 88 mcg daily.  Recheck TSH today.  Follow up: 6 months recheck blood pressure and diabetes Orders Placed This Encounter  Procedures   VITAMIN D 25 Hydroxy (Vit-D Deficiency, Fractures)   CBC with Differential/Platelet   Comprehensive metabolic panel   Lipid panel   TSH   No orders of the defined types were placed in this encounter.     Body mass index is 30.57 kg/m. Wt Readings from Last 3  Encounters:  11/22/22 189 lb 6.4 oz (85.9 kg)  09/27/22 181 lb (82.1 kg)  06/01/22 181 lb (82.1 kg)     Patient Active Problem List   Diagnosis Date Noted Date Diagnosed   Statin intolerance 03/20/2017     Priority: High    Myalgias, simvistatin and crestor    Controlled type 2 diabetes  mellitus without complication, without long-term current use of insulin (HCC) 02/17/2017     Priority: High   Combined hyperlipidemia associated with type 2 diabetes mellitus (HCC) 02/17/2017     Priority: High    Statin intolerant; zetia intolerant.  Myalgias with simvastatin and atrovastatin and zetia; pt declines further medications.  Takes red yeast rice and diet    Spinal stenosis      Priority: High    NS - early, MRI    Hypothyroidism (acquired) 03/26/2013     Priority: High   Obesity (BMI 30.0-34.9) 01/22/2013     Priority: High   Insomnia 09/11/2013     Priority: Medium    Osteoarthritis 04/28/2011     Priority: Medium    Vitamin D deficiency 03/20/2017     Priority: Low   Allergic rhinitis 11/25/2009     Priority: Low   Osteoporosis screening 11/16/2018     Normal bone density 2020; recheck 2025    Health Maintenance  Topic Date Due   Diabetic kidney evaluation - eGFR measurement  10/20/2022   HEMOGLOBIN A1C  11/12/2022   COVID-19 Vaccine (1 - 2023-24 season) 12/08/2022 (Originally 10/31/2022)   MAMMOGRAM  11/27/2022   OPHTHALMOLOGY EXAM  01/12/2023   Diabetic kidney evaluation - Urine ACR  05/12/2023   Medicare Annual Wellness (AWV)  06/01/2023   DEXA SCAN  11/10/2023   FOOT EXAM  11/22/2023   DTaP/Tdap/Td (3 - Td or Tdap) 10/18/2024   Colonoscopy  01/27/2031   Pneumonia Vaccine 75+ Years old  Completed   Hepatitis C Screening  Completed   Zoster Vaccines- Shingrix  Completed   HPV VACCINES  Aged Out   INFLUENZA VACCINE  Discontinued   Immunization History  Administered Date(s) Administered   DTaP 04/27/2006   Fluad Quad(high Dose 65+) 12/29/2020   Hepatitis B, ADULT 04/28/1991   Hepatitis B, PED/ADOLESCENT 04/28/1991   Influenza Split 12/26/2007   Influenza, Quadrivalent, Recombinant, Inj, Pf 01/22/2013   Influenza, Seasonal, Injecte, Preservative Fre 12/13/2013, 11/20/2014   Influenza,inj,Quad PF,6+ Mos 12/17/2015, 01/07/2019   Influenza-Unspecified  12/14/2016, 02/08/2020   Pneumococcal Conjugate-13 11/23/2018   Pneumococcal Polysaccharide-23 08/27/2014, 07/04/2020   Tdap 10/19/2014   Zoster Recombinant(Shingrix) 11/25/2020, 05/12/2021   We updated and reviewed the patient's past history in detail and it is documented below. Allergies: Patient is allergic to morphine and codeine, statins, dilaudid [hydromorphone], percocet [oxycodone-acetaminophen], vicodin [hydrocodone-acetaminophen], and zetia [ezetimibe]. Past Medical History Patient  has a past medical history of Arthritis, Diabetes mellitus without complication (HCC), HTN (hypertension), Hyperlipidemia, Spinal stenosis, and Thyroid disease. Past Surgical History Patient  has a past surgical history that includes Abdominal hysterectomy; Carpal tunnel release (Right); Knee surgery (Left); Tonsillectomy and adenoidectomy; Spine surgery; Cervical fusion; Lumbar laminectomy; and Trigger finger release (Right, 09/27/2022). Family History: Patient family history includes Cancer in her father, mother, and sister; Diabetes in her brother, father, and sister; Heart disease in her father; Hyperlipidemia in her father, mother, sister, and sister. Social History:  Patient  reports that she has never smoked. She has never used smokeless tobacco. She reports that she does not drink alcohol and does not use drugs.  Review of Systems: Constitutional: negative for fever or malaise Ophthalmic: negative for photophobia, double vision or loss of vision Cardiovascular: negative for chest pain, dyspnea on exertion, or new LE swelling Respiratory: negative for SOB or persistent cough Gastrointestinal: negative for abdominal pain, change in bowel habits or melena Genitourinary: negative for dysuria or gross hematuria, no abnormal uterine bleeding or disharge Musculoskeletal: negative for new gait disturbance or muscular weakness Integumentary: negative for new or persistent rashes, no breast  lumps Neurological: negative for TIA or stroke symptoms Psychiatric: negative for SI or delusions Allergic/Immunologic: negative for hives  Patient Care Team    Relationship Specialty Notifications Start End  Willow Ora, MD PCP - General Family Medicine  12/20/14   Coletta Memos, MD Consulting Physician Neurosurgery  02/17/17   Janet Berlin, MD Consulting Physician Ophthalmology  05/17/17   Erroll Luna, [facility] (Inactive)  Pharmacist  12/22/20    Comment: [phone]    Objective  Vitals: BP 138/82   Pulse 85   Temp 98 F (36.7 C)   Ht 5\' 6"  (1.676 m)   Wt 189 lb 6.4 oz (85.9 kg)   SpO2 97%   BMI 30.57 kg/m  General:  Well developed, well nourished, no acute distress, dry skin Psych:  Alert and orientedx3,normal mood and affect HEENT:  Normocephalic, atraumatic, non-icteric sclera,  supple neck without adenopathy, mass or thyromegaly Cardiovascular:  Normal S1, S2, RRR without gallop, rub or murmur Respiratory:  Good breath sounds bilaterally, CTAB with normal respiratory effort Gastrointestinal: normal bowel sounds, soft, non-tender, no noted masses. No HSM MSK: extremities without edema, joints without erythema or swelling Neurologic:    Mental status is normal.  Gross motor and sensory exams are normal.  No tremor Diabetic Foot Exam: Appearance - no lesions, ulcers or significant calluses Skin - no sigificant pallor or erythema Normal sensation Pulses - +2 distally bilaterally  Commons side effects, risks, benefits, and alternatives for medications and treatment plan prescribed today were discussed, and the patient expressed understanding of the given instructions. Patient is instructed to call or message via MyChart if he/she has any questions or concerns regarding our treatment plan. No barriers to understanding were identified. We discussed Red Flag symptoms and signs in detail. Patient expressed understanding regarding what to do in case of urgent or emergency  type symptoms.  Medication list was reconciled, printed and provided to the patient in AVS. Patient instructions and summary information was reviewed with the patient as documented in the AVS. This note was prepared with assistance of Dragon voice recognition software. Occasional wrong-word or sound-a-like substitutions may have occurred due to the inherent limitations of voice recognition software

## 2022-11-23 ENCOUNTER — Other Ambulatory Visit: Payer: Self-pay

## 2022-11-23 ENCOUNTER — Other Ambulatory Visit (INDEPENDENT_AMBULATORY_CARE_PROVIDER_SITE_OTHER): Payer: Medicare HMO

## 2022-11-23 DIAGNOSIS — E119 Type 2 diabetes mellitus without complications: Secondary | ICD-10-CM

## 2022-11-23 LAB — HEMOGLOBIN A1C: Hgb A1c MFr Bld: 6.1 % (ref 4.6–6.5)

## 2022-11-23 NOTE — Progress Notes (Signed)
Please add on a hemoglobin A1c for diet-controlled diabetes.  This got missed yesterday.  See MyChart note

## 2022-11-25 NOTE — Progress Notes (Signed)
See mychart note Dear Ms. Rampersad, Your A1c is 6.1.  Looks good! Sincerely, Dr. [name]

## 2022-12-02 DIAGNOSIS — Z1231 Encounter for screening mammogram for malignant neoplasm of breast: Secondary | ICD-10-CM | POA: Diagnosis not present

## 2022-12-02 LAB — HM MAMMOGRAPHY

## 2022-12-18 DIAGNOSIS — W19XXXA Unspecified fall, initial encounter: Secondary | ICD-10-CM | POA: Diagnosis not present

## 2022-12-18 DIAGNOSIS — S9032XA Contusion of left foot, initial encounter: Secondary | ICD-10-CM | POA: Diagnosis not present

## 2022-12-18 DIAGNOSIS — S60222A Contusion of left hand, initial encounter: Secondary | ICD-10-CM | POA: Diagnosis not present

## 2022-12-18 DIAGNOSIS — S6992XA Unspecified injury of left wrist, hand and finger(s), initial encounter: Secondary | ICD-10-CM | POA: Diagnosis not present

## 2022-12-18 DIAGNOSIS — E079 Disorder of thyroid, unspecified: Secondary | ICD-10-CM | POA: Diagnosis not present

## 2022-12-18 DIAGNOSIS — E785 Hyperlipidemia, unspecified: Secondary | ICD-10-CM | POA: Diagnosis not present

## 2022-12-18 DIAGNOSIS — I10 Essential (primary) hypertension: Secondary | ICD-10-CM | POA: Diagnosis not present

## 2022-12-18 DIAGNOSIS — S61412A Laceration without foreign body of left hand, initial encounter: Secondary | ICD-10-CM | POA: Diagnosis not present

## 2022-12-18 DIAGNOSIS — I959 Hypotension, unspecified: Secondary | ICD-10-CM | POA: Diagnosis not present

## 2022-12-18 DIAGNOSIS — Z981 Arthrodesis status: Secondary | ICD-10-CM | POA: Diagnosis not present

## 2022-12-18 DIAGNOSIS — M47812 Spondylosis without myelopathy or radiculopathy, cervical region: Secondary | ICD-10-CM | POA: Diagnosis not present

## 2022-12-18 DIAGNOSIS — E119 Type 2 diabetes mellitus without complications: Secondary | ICD-10-CM | POA: Diagnosis not present

## 2022-12-18 DIAGNOSIS — M79642 Pain in left hand: Secondary | ICD-10-CM | POA: Diagnosis not present

## 2022-12-18 DIAGNOSIS — S99922A Unspecified injury of left foot, initial encounter: Secondary | ICD-10-CM | POA: Diagnosis not present

## 2022-12-18 DIAGNOSIS — S0003XA Contusion of scalp, initial encounter: Secondary | ICD-10-CM | POA: Diagnosis not present

## 2022-12-18 DIAGNOSIS — M79672 Pain in left foot: Secondary | ICD-10-CM | POA: Diagnosis not present

## 2022-12-18 DIAGNOSIS — S0990XA Unspecified injury of head, initial encounter: Secondary | ICD-10-CM | POA: Diagnosis not present

## 2022-12-21 DIAGNOSIS — D692 Other nonthrombocytopenic purpura: Secondary | ICD-10-CM | POA: Diagnosis not present

## 2022-12-21 DIAGNOSIS — D2271 Melanocytic nevi of right lower limb, including hip: Secondary | ICD-10-CM | POA: Diagnosis not present

## 2022-12-21 DIAGNOSIS — D2262 Melanocytic nevi of left upper limb, including shoulder: Secondary | ICD-10-CM | POA: Diagnosis not present

## 2022-12-21 DIAGNOSIS — D225 Melanocytic nevi of trunk: Secondary | ICD-10-CM | POA: Diagnosis not present

## 2022-12-21 DIAGNOSIS — Z85828 Personal history of other malignant neoplasm of skin: Secondary | ICD-10-CM | POA: Diagnosis not present

## 2022-12-21 DIAGNOSIS — L570 Actinic keratosis: Secondary | ICD-10-CM | POA: Diagnosis not present

## 2022-12-21 DIAGNOSIS — L821 Other seborrheic keratosis: Secondary | ICD-10-CM | POA: Diagnosis not present

## 2022-12-26 ENCOUNTER — Ambulatory Visit: Payer: Medicare HMO

## 2022-12-26 ENCOUNTER — Ambulatory Visit
Admission: EM | Admit: 2022-12-26 | Discharge: 2022-12-26 | Disposition: A | Discharge status: Home / Self Care | Payer: Medicare HMO

## 2022-12-26 DIAGNOSIS — K5730 Diverticulosis of large intestine without perforation or abscess without bleeding: Secondary | ICD-10-CM | POA: Insufficient documentation

## 2022-12-26 DIAGNOSIS — Z860100 Personal history of colon polyps, unspecified: Secondary | ICD-10-CM | POA: Insufficient documentation

## 2022-12-26 DIAGNOSIS — K219 Gastro-esophageal reflux disease without esophagitis: Secondary | ICD-10-CM | POA: Insufficient documentation

## 2022-12-26 DIAGNOSIS — M79642 Pain in left hand: Secondary | ICD-10-CM | POA: Diagnosis not present

## 2022-12-26 DIAGNOSIS — Z800 Family history of malignant neoplasm of digestive organs: Secondary | ICD-10-CM | POA: Insufficient documentation

## 2022-12-26 DIAGNOSIS — L03114 Cellulitis of left upper limb: Secondary | ICD-10-CM | POA: Diagnosis not present

## 2022-12-26 DIAGNOSIS — Z1211 Encounter for screening for malignant neoplasm of colon: Secondary | ICD-10-CM | POA: Insufficient documentation

## 2022-12-26 DIAGNOSIS — M19042 Primary osteoarthritis, left hand: Secondary | ICD-10-CM | POA: Diagnosis not present

## 2022-12-26 DIAGNOSIS — E669 Obesity, unspecified: Secondary | ICD-10-CM | POA: Insufficient documentation

## 2022-12-26 MED ORDER — DOXYCYCLINE HYCLATE 100 MG PO CAPS: 100.0000 mg | ORAL_CAPSULE | Freq: Two times a day (BID) | ORAL | 0 refills | Status: AC

## 2022-12-26 NOTE — ED Triage Notes (Addendum)
"  Last Saturday the 19th I was at a Foot ball game and went to step up bleachers and body surfed down them hitting concrete and multiple scrapes and injury to left arm/wrist/hand". "I now am having some purulent drainage from this left hand". I was seen in ED on 12-18-2022 In [place]". "This wound/hand was not irrigated prior to glued area, and skin flap repaired on left hand/top". No fever. Last Tdap (2016)

## 2022-12-30 ENCOUNTER — Ambulatory Visit
Admission: RE | Admit: 2022-12-30 | Discharge: 2022-12-30 | Disposition: A | Discharge status: Home / Self Care | Payer: Medicare HMO | Source: Ambulatory Visit

## 2022-12-30 VITALS — BP 148/82 | HR 75 | Temp 97.6°F | Resp 18 | Ht 66.0 in | Wt 184.3 lb

## 2022-12-30 DIAGNOSIS — L03012 Cellulitis of left finger: Secondary | ICD-10-CM

## 2022-12-30 MED ORDER — SULFAMETHOXAZOLE-TRIMETHOPRIM 800-160 MG PO TABS
1.0000 | ORAL_TABLET | Freq: Two times a day (BID) | ORAL | 0 refills | Status: AC
Start: 2022-12-30 — End: 2023-01-06

## 2022-12-30 NOTE — ED Triage Notes (Signed)
Follow up for Left hand problem/injury (swelling/infection). I am needing to make sure the infection in my left hand. Redness has improved. No fever.

## 2023-01-19 DIAGNOSIS — H524 Presbyopia: Secondary | ICD-10-CM | POA: Diagnosis not present

## 2023-01-19 DIAGNOSIS — H52203 Unspecified astigmatism, bilateral: Secondary | ICD-10-CM | POA: Diagnosis not present

## 2023-01-19 DIAGNOSIS — H43813 Vitreous degeneration, bilateral: Secondary | ICD-10-CM | POA: Diagnosis not present

## 2023-01-19 DIAGNOSIS — H25013 Cortical age-related cataract, bilateral: Secondary | ICD-10-CM | POA: Diagnosis not present

## 2023-01-19 DIAGNOSIS — H5203 Hypermetropia, bilateral: Secondary | ICD-10-CM | POA: Diagnosis not present

## 2023-01-19 DIAGNOSIS — H11001 Unspecified pterygium of right eye: Secondary | ICD-10-CM | POA: Diagnosis not present

## 2023-01-19 DIAGNOSIS — H2513 Age-related nuclear cataract, bilateral: Secondary | ICD-10-CM | POA: Diagnosis not present

## 2023-01-19 DIAGNOSIS — E119 Type 2 diabetes mellitus without complications: Secondary | ICD-10-CM | POA: Diagnosis not present

## 2023-01-22 ENCOUNTER — Encounter: Payer: Self-pay | Admitting: Physician Assistant

## 2023-01-22 NOTE — ED Provider Notes (Signed)
[name]    CSN: [id number] Arrival date & time: 12/26/22  1033      History   Chief Complaint Chief Complaint  Patient presents with   Follow-up    ED Visit: 12-18-2022    HPI Holly Riley is a 67 y.o. female.   Patient here today for evaluation of swelling and erythema in her hand after she had injury from falling down bleachers a few days ago.  She was seen in the emergency room and had a wound repaired however did not feel as if it was irrigated appropriately prior to skin glue applied.  She notes she is not having some purulent drainage which concerned her.  The history is provided by the patient.    Past Medical History:  Diagnosis Date   Arthritis    Diabetes mellitus without complication (HCC)    HTN (hypertension)    Hyperlipidemia    Spinal stenosis    NS - early, MRI   Thyroid disease     Patient Active Problem List   Diagnosis Date Noted   History of colonic polyps 12/26/2022   Diverticular disease of colon 12/26/2022   Family history of colon cancer 12/26/2022   Gastroesophageal reflux disease 12/26/2022   Obesity 12/26/2022   Colon cancer screening 12/26/2022   Osteoporosis screening 11/16/2018   Statin intolerance 03/20/2017   Vitamin D deficiency 03/20/2017   Controlled type 2 diabetes mellitus without complication, without long-term current use of insulin (HCC) 02/17/2017   Combined hyperlipidemia associated with type 2 diabetes mellitus (HCC) 02/17/2017   Spinal stenosis    Insomnia 09/11/2013   Hypothyroidism (acquired) 03/26/2013   Obesity (BMI 30.0-34.9) 01/22/2013   Osteoarthritis 04/28/2011   Allergic rhinitis 11/25/2009    Past Surgical History:  Procedure Laterality Date   ABDOMINAL HYSTERECTOMY     CARPAL TUNNEL RELEASE Right    CERVICAL FUSION     [place]   KNEE SURGERY Left    LUMBAR LAMINECTOMY     x 2, L4, L5, [place]   SPINE SURGERY     TONSILLECTOMY AND ADENOIDECTOMY     TRIGGER FINGER RELEASE Right  09/27/2022   Procedure: RELEASE TRIGGER FINGER/A-1 PULLEY RIGHT THUMB;  Surgeon: Betha Loa, MD;  Location: [facility];  Service: Orthopedics;  Laterality: Right;  30 MIN    OB History   No obstetric history on file.      Home Medications    Prior to Admission medications   Medication Sig Start Date End Date Taking? Authorizing Provider  levothyroxine (SYNTHROID) 88 MCG tablet TAKE 1 TABLET(88 MCG) BY MOUTH DAILY BEFORE BREAKFAST 06/21/22  Yes Willow Ora, MD  lisinopril (ZESTRIL) 10 MG tablet Take 1 tablet (10 mg total) by mouth daily. 06/01/22  Yes Willow Ora, MD  ASHWAGANDHA PO  08/03/21   [provider]  azelastine (ASTELIN) 0.1 % nasal spray Place 1 spray into both nostrils 2 (two) times daily. Use in each nostril as directed 06/30/21   Willow Ora, MD  Cholecalciferol (VITAMIN D3) 50 MCG (2000 UT) capsule Take 2,000 Units by mouth daily.     [provider]  Chromium-Cinnamon [phone] MCG-MG CAPS  03/24/22   [provider]  co-enzyme Q-10 30 MG capsule Take 30 mg by mouth daily.    [provider]  Coenzyme Q10 (CO Q 10 PO)  08/26/19   [provider]  Flaxseed, Linseed, (FLAX SEED OIL PO) Take 1 tablet by mouth daily.  [provider]  Garlic 10 MG CAPS Take 10 mg by mouth daily.    [provider]  [place] Lanius 500 MG CAPS  02/26/22   [provider]  Omega-3 Fatty Acids (FISH OIL CONCENTRATE) 1000 MG CAPS Take 1,000 mg by mouth daily.    [provider]  Red Yeast Rice 600 MG TABS  05/09/20   [provider]  Sod Picosulfate-Mag Ox-Cit Acd (CLENPIQ) 10-3.5-12 MG-GM -GM/160ML SOLN 160 mL orally twice for 2 01/16/21   [provider]    Family History Family History  Problem Relation Age of Onset   Cancer Mother    Hyperlipidemia Mother    Cancer Father    Diabetes Father    Heart disease Father    Hyperlipidemia Father    Cancer Sister    Diabetes  Sister    Hyperlipidemia Sister    Hyperlipidemia Sister    Diabetes Brother     Social History Social History   Tobacco Use   Smoking status: Former    Types: Cigarettes   Smokeless tobacco: Never  Vaping Use   Vaping status: Never Used  Substance Use Topics   Alcohol use: No   Drug use: No     Allergies   Hydrocodone-acetaminophen, Morphine, Morphine and codeine, Oxycodone-acetaminophen, Ezetimibe, Hydromorphone, Statins, Percocet [oxycodone-acetaminophen], and Vicodin [hydrocodone-acetaminophen]   Review of Systems Review of Systems  Constitutional:  Negative for chills and fever.  Eyes:  Negative for discharge and redness.  Gastrointestinal:  Negative for abdominal pain, nausea and vomiting.  Skin:  Positive for color change and wound.  Neurological:  Negative for numbness.     Physical Exam Triage Vital Signs ED Triage Vitals  Encounter Vitals Group     BP 12/26/22 1049 (!) 171/82     Systolic BP Percentile --      Diastolic BP Percentile --      Pulse Rate 12/26/22 1049 81     Resp 12/26/22 1049 18     Temp 12/26/22 1049 97.9 F (36.6 C)     Temp Source 12/26/22 1049 Oral     SpO2 12/26/22 1049 97 %     Weight 12/26/22 1046 184 lb 4.9 oz (83.6 kg)     Height 12/26/22 1046 5\' 6"  (1.676 m)     Head Circumference --      Peak Flow --      Pain Score 12/26/22 1039 0     Pain Loc --      Pain Education --      Exclude from Growth Chart --    No data found.  Updated Vital Signs BP (!) 145/77 (BP Location: Left Arm)   Pulse 75   Temp 97.9 F (36.6 C) (Oral)   Resp 18   Ht 5\' 6"  (1.676 m)   Wt 184 lb 4.9 oz (83.6 kg)   SpO2 97%   BMI 29.75 kg/m   Visual Acuity Right Eye Distance:   Left Eye Distance:   Bilateral Distance:    Right Eye Near:   Left Eye Near:    Bilateral Near:     Physical Exam Vitals and nursing note reviewed.  Constitutional:      General: She is not in acute distress.    Appearance: Normal appearance. She is not  ill-appearing.  HENT:     Head: Normocephalic and atraumatic.  Eyes:     Conjunctiva/sclera: Conjunctivae normal.  Cardiovascular:     Rate and Rhythm: Normal rate.  Pulmonary:     Effort: Pulmonary effort is normal. No respiratory distress.  Musculoskeletal:     Comments: Diffuse swelling and erythema to left hand with mild purulent drainage noted to wound, no active bleeding  Neurological:     Mental Status: She is alert.  Psychiatric:        Mood and Affect: Mood normal.        Behavior: Behavior normal.        Thought Content: Thought content normal.      UC Treatments / Results  Labs (all labs ordered are listed, but only abnormal results are displayed) Labs Reviewed - No data to display  EKG   Radiology No results found.  Procedures Procedures (including critical care time)  Medications Ordered in UC Medications - No data to display  Initial Impression / Assessment and Plan / UC Course  I have reviewed the triage vital signs and the nursing notes.  Pertinent labs & imaging results that were available during my care of the patient were reviewed by me and considered in my medical decision making (see chart for details).    X-ray reviewed with no acute findings.  Concern for cellulitis and will treat with doxycycline.  Advise she continue to monitor symptoms and follow-up with any further concerns.  Patient expresses understanding.  Final Clinical Impressions(s) / UC Diagnoses   Final diagnoses:  Cellulitis of left hand   Discharge Instructions   None    ED Prescriptions     Medication Sig Dispense Auth. Provider   doxycycline (VIBRAMYCIN) 100 MG capsule Take 1 capsule (100 mg total) by mouth 2 (two) times daily for 7 days. 14 capsule Tomi Bamberger, PA-C      PDMP not reviewed this encounter.   Tomi Bamberger, PA-C 01/22/23 [phone]

## 2023-01-24 DIAGNOSIS — S61402A Unspecified open wound of left hand, initial encounter: Secondary | ICD-10-CM | POA: Diagnosis not present

## 2023-01-26 NOTE — ED Provider Notes (Signed)
[name]    CSN: [id number] Arrival date & time: 12/30/22  1153      History   Chief Complaint Chief Complaint  Patient presents with   Follow-up    Entered by patient    HPI Holly Riley is a 67 y.o. female.   Patient returns urgent care today for follow-up of left hand swelling.  She reports that she wanted to be sure that there is no further infection in her hand.  She notes that redness has improved.  She has not any fever.  She denies any nausea or vomiting.  The history is provided by the patient.    Past Medical History:  Diagnosis Date   Arthritis    Diabetes mellitus without complication (HCC)    HTN (hypertension)    Hyperlipidemia    Spinal stenosis    NS - early, MRI   Thyroid disease     Patient Active Problem List   Diagnosis Date Noted   History of colonic polyps 12/26/2022   Diverticular disease of colon 12/26/2022   Family history of colon cancer 12/26/2022   Gastroesophageal reflux disease 12/26/2022   Obesity 12/26/2022   Colon cancer screening 12/26/2022   Osteoporosis screening 11/16/2018   Statin intolerance 03/20/2017   Vitamin D deficiency 03/20/2017   Controlled type 2 diabetes mellitus without complication, without long-term current use of insulin (HCC) 02/17/2017   Combined hyperlipidemia associated with type 2 diabetes mellitus (HCC) 02/17/2017   Spinal stenosis    Insomnia 09/11/2013   Hypothyroidism (acquired) 03/26/2013   Obesity (BMI 30.0-34.9) 01/22/2013   Osteoarthritis 04/28/2011   Allergic rhinitis 11/25/2009    Past Surgical History:  Procedure Laterality Date   ABDOMINAL HYSTERECTOMY     CARPAL TUNNEL RELEASE Right    CERVICAL FUSION     [place]   KNEE SURGERY Left    LUMBAR LAMINECTOMY     x 2, L4, L5, [place]   SPINE SURGERY     TONSILLECTOMY AND ADENOIDECTOMY     TRIGGER FINGER RELEASE Right 09/27/2022   Procedure: RELEASE TRIGGER FINGER/A-1 PULLEY RIGHT THUMB;  Surgeon: Betha Loa, MD;   Location: [facility];  Service: Orthopedics;  Laterality: Right;  30 MIN    OB History   No obstetric history on file.      Home Medications    Prior to Admission medications   Medication Sig Start Date End Date Taking? Authorizing Provider  ASHWAGANDHA PO  08/03/21   [provider]  azelastine (ASTELIN) 0.1 % nasal spray Place 1 spray into both nostrils 2 (two) times daily. Use in each nostril as directed 06/30/21   Willow Ora, MD  Cholecalciferol (VITAMIN D3) 50 MCG (2000 UT) capsule Take 2,000 Units by mouth daily.     [provider]  Chromium-Cinnamon [phone] MCG-MG CAPS  03/24/22   [provider]  co-enzyme Q-10 30 MG capsule Take 30 mg by mouth daily.    [provider]  Coenzyme Q10 (CO Q 10 PO)  08/26/19   [provider]  Flaxseed, Linseed, (FLAX SEED OIL PO) Take 1 tablet by mouth daily.    [provider]  Garlic 10 MG CAPS Take 10 mg by mouth daily.    [provider]  [place] Lanius 500 MG CAPS  02/26/22   [provider]  levothyroxine (SYNTHROID) 88 MCG tablet TAKE 1 TABLET(88 MCG) BY MOUTH DAILY BEFORE BREAKFAST 06/21/22   Willow Ora, MD  lisinopril (ZESTRIL) 10  MG tablet Take 1 tablet (10 mg total) by mouth daily. 06/01/22   Willow Ora, MD  Omega-3 Fatty Acids (FISH OIL CONCENTRATE) 1000 MG CAPS Take 1,000 mg by mouth daily.    [provider]  Red Yeast Rice 600 MG TABS  05/09/20   [provider]  Sod Picosulfate-Mag Ox-Cit Acd (CLENPIQ) 10-3.5-12 MG-GM -GM/160ML SOLN 160 mL orally twice for 2 01/16/21   [provider]    Family History Family History  Problem Relation Age of Onset   Cancer Mother    Hyperlipidemia Mother    Cancer Father    Diabetes Father    Heart disease Father    Hyperlipidemia Father    Cancer Sister    Diabetes Sister    Hyperlipidemia Sister    Hyperlipidemia Sister    Diabetes Brother     Social History Social  History   Tobacco Use   Smoking status: Former    Types: Cigarettes   Smokeless tobacco: Never  Vaping Use   Vaping status: Never Used  Substance Use Topics   Alcohol use: No   Drug use: No     Allergies   Hydrocodone-acetaminophen, Morphine, Morphine and codeine, Oxycodone-acetaminophen, Ezetimibe, Hydromorphone, Statins, Percocet [oxycodone-acetaminophen], and Vicodin [hydrocodone-acetaminophen]   Review of Systems Review of Systems  Constitutional:  Negative for chills and fever.  Eyes:  Negative for discharge and redness.  Gastrointestinal:  Negative for abdominal pain, nausea and vomiting.  Skin:  Positive for color change and wound.  Neurological:  Negative for numbness.     Physical Exam Triage Vital Signs ED Triage Vitals  Encounter Vitals Group     BP 12/30/22 1249 (!) 148/82     Systolic BP Percentile --      Diastolic BP Percentile --      Pulse Rate 12/30/22 1249 75     Resp 12/30/22 1249 18     Temp 12/30/22 1249 97.6 F (36.4 C)     Temp Source 12/30/22 1249 Oral     SpO2 12/30/22 1249 99 %     Weight 12/30/22 1247 184 lb 4.9 oz (83.6 kg)     Height 12/30/22 1247 5\' 6"  (1.676 m)     Head Circumference --      Peak Flow --      Pain Score 12/30/22 1247 0     Pain Loc --      Pain Education --      Exclude from Growth Chart --    No data found.  Updated Vital Signs BP (!) 148/82 (BP Location: Left Arm)   Pulse 75   Temp 97.6 F (36.4 C) (Oral)   Resp 18   Ht 5\' 6"  (1.676 m)   Wt 184 lb 4.9 oz (83.6 kg)   SpO2 99%   BMI 29.75 kg/m   Visual Acuity Right Eye Distance:   Left Eye Distance:   Bilateral Distance:    Right Eye Near:   Left Eye Near:    Bilateral Near:     Physical Exam Vitals and nursing note reviewed.  Constitutional:      General: She is not in acute distress.    Appearance: Normal appearance. She is not ill-appearing.  HENT:     Head: Normocephalic and atraumatic.  Eyes:     Conjunctiva/sclera: Conjunctivae  normal.  Cardiovascular:     Rate and Rhythm: Normal rate.  Pulmonary:     Effort: Pulmonary effort is normal. No respiratory distress.  Musculoskeletal:  Comments: Diffuse swelling and erythema to left hand with mild purulent drainage noted to wound-- all improved from last visit, no active bleeding  Neurological:     Mental Status: She is alert.  Psychiatric:        Mood and Affect: Mood normal.        Behavior: Behavior normal.        Thought Content: Thought content normal.      UC Treatments / Results  Labs (all labs ordered are listed, but only abnormal results are displayed) Labs Reviewed - No data to display  EKG   Radiology No results found.  Procedures Procedures (including critical care time)  Medications Ordered in UC Medications - No data to display  Initial Impression / Assessment and Plan / UC Course  I have reviewed the triage vital signs and the nursing notes.  Pertinent labs & imaging results that were available during my care of the patient were reviewed by me and considered in my medical decision making (see chart for details).    Will continue treatment with Bactrim for further infection control.  Encouraged follow-up if no continued improvement or with any further concerns.  Final Clinical Impressions(s) / UC Diagnoses   Final diagnoses:  Cellulitis of finger of left hand   Discharge Instructions   None    ED Prescriptions     Medication Sig Dispense Auth. Provider   sulfamethoxazole-trimethoprim (BACTRIM DS) 800-160 MG tablet Take 1 tablet by mouth 2 (two) times daily for 7 days. 14 tablet Tomi Bamberger, PA-C      PDMP not reviewed this encounter.   Tomi Bamberger, PA-C 01/26/23 [phone]

## 2023-02-15 DIAGNOSIS — S61402A Unspecified open wound of left hand, initial encounter: Secondary | ICD-10-CM | POA: Diagnosis not present

## 2023-03-07 DIAGNOSIS — H25812 Combined forms of age-related cataract, left eye: Secondary | ICD-10-CM | POA: Diagnosis not present

## 2023-03-07 DIAGNOSIS — Z961 Presence of intraocular lens: Secondary | ICD-10-CM | POA: Diagnosis not present

## 2023-03-07 DIAGNOSIS — H25012 Cortical age-related cataract, left eye: Secondary | ICD-10-CM | POA: Diagnosis not present

## 2023-03-07 DIAGNOSIS — H2512 Age-related nuclear cataract, left eye: Secondary | ICD-10-CM | POA: Diagnosis not present

## 2023-03-14 DIAGNOSIS — H25811 Combined forms of age-related cataract, right eye: Secondary | ICD-10-CM | POA: Diagnosis not present

## 2023-03-14 DIAGNOSIS — H2511 Age-related nuclear cataract, right eye: Secondary | ICD-10-CM | POA: Diagnosis not present

## 2023-03-14 DIAGNOSIS — Z961 Presence of intraocular lens: Secondary | ICD-10-CM | POA: Diagnosis not present

## 2023-03-14 DIAGNOSIS — H25011 Cortical age-related cataract, right eye: Secondary | ICD-10-CM | POA: Diagnosis not present

## 2023-03-14 DIAGNOSIS — H52221 Regular astigmatism, right eye: Secondary | ICD-10-CM | POA: Diagnosis not present

## 2023-04-26 ENCOUNTER — Encounter: Payer: Self-pay | Admitting: Family Medicine

## 2023-05-02 DIAGNOSIS — L570 Actinic keratosis: Secondary | ICD-10-CM | POA: Diagnosis not present

## 2023-05-02 DIAGNOSIS — L820 Inflamed seborrheic keratosis: Secondary | ICD-10-CM | POA: Diagnosis not present

## 2023-05-02 DIAGNOSIS — Z85828 Personal history of other malignant neoplasm of skin: Secondary | ICD-10-CM | POA: Diagnosis not present

## 2023-05-23 ENCOUNTER — Ambulatory Visit: Payer: Medicare HMO | Admitting: Family Medicine

## 2023-05-23 VITALS — BP 128/62 | HR 98 | Temp 98.1°F | Ht 66.0 in | Wt 190.4 lb

## 2023-05-23 DIAGNOSIS — E782 Mixed hyperlipidemia: Secondary | ICD-10-CM

## 2023-05-23 DIAGNOSIS — M48061 Spinal stenosis, lumbar region without neurogenic claudication: Secondary | ICD-10-CM

## 2023-05-23 DIAGNOSIS — E1169 Type 2 diabetes mellitus with other specified complication: Secondary | ICD-10-CM | POA: Diagnosis not present

## 2023-05-23 DIAGNOSIS — Z780 Asymptomatic menopausal state: Secondary | ICD-10-CM

## 2023-05-23 DIAGNOSIS — E119 Type 2 diabetes mellitus without complications: Secondary | ICD-10-CM

## 2023-05-23 DIAGNOSIS — Z789 Other specified health status: Secondary | ICD-10-CM

## 2023-05-23 LAB — POCT GLYCOSYLATED HEMOGLOBIN (HGB A1C): Hemoglobin A1C: 5.9 % — AB (ref 4.0–5.6)

## 2023-05-23 LAB — MICROALBUMIN / CREATININE URINE RATIO
Creatinine,U: 48.6 mg/dL
Microalb Creat Ratio: UNDETERMINED mg/g (ref 0.0–30.0)
Microalb, Ur: -0.1 mg/dL — ABNORMAL LOW (ref 0.0–1.9)

## 2023-05-23 MED ORDER — LISINOPRIL 10 MG PO TABS: 10.0000 mg | ORAL_TABLET | Freq: Every day | ORAL | 3 refills | Status: DC

## 2023-05-23 NOTE — Progress Notes (Signed)
 Subjective  CC:  Chief Complaint  Patient presents with   Diabetes   Hypertension    HPI: Holly Riley is a 68 y.o. female who presents to the office today for follow up of diabetes and problems listed above in the chief complaint.  Discussed the use of AI scribe software for clinical note transcription with the patient, who gave verbal consent to proceed.  History of Present Illness  Discussed the use of AI scribe software for clinical note transcription with the patient, who gave verbal consent to proceed.  History of Present Illness   Holly Riley is a 68 year old female with hypertension and diabetes who presents for follow-up of her blood pressure and recent fall with head trauma.  She experienced a fall on October 19th while attending a youth league football game, resulting in head trauma and a hand injury. She was taken by EMS due to a concussion and slept for three days following the incident. She still has a mark on her head and reports that the wound on her hand became infected. She describes difficulty making a full fist due to tight skin and tendon involvement, and she has been using a stress ball and Play-Doh for hand therapy. She recalls falling down seven concrete stadium steps, hitting her head, and sustaining a black eye.  She reports feeling 'really crappy' all day yesterday, with a blood pressure reading of 138/65 mmHg at home. Her blood pressure readings at home are sometimes lower, and she checks it when she feels unwell. Her blood pressure was previously in the 120s or 116s when she was not on any medication. She is currently on lisinopril 10 mg, which she called in for a renewal last week, but there were issues with the pharmacy. She has experienced high blood pressure readings during her last four doctor's visits. No chest pain or palpitations. She feels tired when her blood pressure is low, sometimes in the 90s, which makes her feel laid out and tired.  She  underwent cataract surgery in January and reports experiencing halos and difficulty reading post-surgery. She had been experiencing vision issues since October of the year before last, which progressed significantly by January when she received new glasses. Despite the surgery, she still struggles with halos and reading.  Her diabetes remains diet-controlled with a recent HbA1c of 5.9, down from 6.1 in September. She mentions a recent weight gain of ten pounds, attributed to stress from a [place]  She previously took Zetia for cholesterol but discontinued it due to leg pain. Her cholesterol was noted to be high in September. She has not started any new cholesterol medication yet.       Wt Readings from Last 3 Encounters:  05/23/23 190 lb 6.4 oz (86.4 kg)  12/30/22 184 lb 4.9 oz (83.6 kg)  12/26/22 184 lb 4.9 oz (83.6 kg)    BP Readings from Last 3 Encounters:  05/23/23 128/62  12/30/22 (!) 148/82  12/26/22 (!) 145/77    Assessment  1. Controlled type 2 diabetes mellitus without complication, without long-term current use of insulin (HCC)   2. Combined hyperlipidemia associated with type 2 diabetes mellitus (HCC)   3. Spinal stenosis of lumbar region without neurogenic claudication   4. Statin intolerance   5. Asymptomatic menopausal state      Plan  Assessment and Plan    Hypertension Blood pressure varies from 116/70 mmHg to 116/65  mmHg. Fatigue reported with elevated readings. Lisinopril 10 mg considered for dose increase if needed. - Refill lisinopril 10 mg. - Advise frequent blood pressure monitoring. - Consider increasing lisinopril dose if blood pressure remains elevated.  Hyperlipidemia Cholesterol elevated. Zetia caused leg pain. Nexlotol suggested as non-statin alternative. - patient to research and let me know if she would like to try it.  Diabetes Mellitus Type 2 Well-controlled with HbA1c of 5.9%.  Managed through diet, no medication required.  Post-concussion syndrome Persistent symptoms affecting hand function. Hand therapy ongoing. - Continue hand therapy exercises, including use of a stress ball or Play-Doh.  Cataract surgery follow-up Post-surgery halos expected to resolve. Improved distance vision, reading remains difficult.  General Health Maintenance Due for bone density scan and mammogram. Previous bone density normal. - Order bone density scan. - Order mammogram.  Follow-up Monitor blood pressure and potential medication adjustments. Review Nicotrol information. - Schedule follow-up appointment based on blood pressure monitoring.  Follow up: 6 mo for cpe Orders Placed This Encounter  Procedures   DG Bone Density   Microalbumin / creatinine urine ratio   POCT HgB A1C   Meds ordered this encounter  Medications   lisinopril (ZESTRIL) 10 MG tablet    Sig: Take 1 tablet (10 mg total) by mouth daily.    Dispense:  90 tablet    Refill:  3      Immunization History  Administered Date(s) Administered   DTaP 04/27/2006   Fluad Quad(high Dose 65+) 12/29/2020   Hepatitis B, ADULT 04/28/1991   Hepatitis B, PED/ADOLESCENT 04/28/1991   Influenza Split 12/26/2007   Influenza, Quadrivalent, Recombinant, Inj, Pf 01/22/2013   Influenza, Seasonal, Injecte, Preservative Fre 12/13/2013, 11/20/2014   Influenza,inj,Quad PF,6+ Mos 12/17/2015, 01/07/2019   Influenza-Unspecified 12/14/2016, 02/08/2020   Pneumococcal Conjugate-13 11/23/2018   Pneumococcal Polysaccharide-23 08/27/2014, 07/04/2020   Tdap 10/19/2014   Zoster Recombinant(Shingrix) 11/25/2020, 05/12/2021    Diabetes Related Lab Review: Lab Results  Component Value Date   HGBA1C 5.9 (A) 05/23/2023   HGBA1C 6.1 11/23/2022   HGBA1C 5.7 (A) 05/12/2022    Lab Results  Component Value Date   MICROALBUR <0.7 05/12/2022   Lab Results  Component Value Date   CREATININE 0.82 11/22/2022   BUN 17 11/22/2022   NA  140 11/22/2022   K 4.0 11/22/2022   CL 106 11/22/2022   CO2 26 11/22/2022   Lab Results  Component Value Date   CHOL 241 (H) 11/22/2022   CHOL 207 (H) 10/19/2021   CHOL 175 10/21/2020   Lab Results  Component Value Date   HDL 79.40 11/22/2022   HDL 68.50 10/19/2021   HDL 68.00 10/21/2020   Lab Results  Component Value Date   LDLCALC 148 (H) 11/22/2022   LDLCALC 122 (H) 10/19/2021   LDLCALC 92 10/21/2020   Lab Results  Component Value Date   TRIG 69.0 11/22/2022   TRIG 81.0 10/19/2021   TRIG 74.0 10/21/2020   Lab Results  Component Value Date   CHOLHDL 3 11/22/2022   CHOLHDL 3 10/19/2021   CHOLHDL 3 10/21/2020   No results found for: "LDLDIRECT" The 10-year ASCVD risk score (Arnett DK, et al., 2019) is: 20.4%   Values used to calculate the score:     Age: 82 years     Sex: Female     Is Non-Hispanic African American: No     Diabetic: Yes     Tobacco smoker: No     Systolic Blood Pressure: 128 mmHg  Is BP treated: Yes     HDL Cholesterol: 79.4 mg/dL     Total Cholesterol: 241 mg/dL I have reviewed the PMH, Fam and Soc history. Patient Active Problem List   Diagnosis Date Noted Date Diagnosed   History of colonic polyps 12/26/2022     Priority: High   Family history of colon cancer 12/26/2022     Priority: High   Statin intolerance 03/20/2017     Priority: High    Myalgias, simvistatin and crestor    Controlled type 2 diabetes mellitus without complication, without long-term current use of insulin (HCC) 02/17/2017     Priority: High    Diet controlled    Combined hyperlipidemia associated with type 2 diabetes mellitus (HCC) 02/17/2017     Priority: High    Statin intolerant; zetia intolerant.  Myalgias with simvastatin and atrovastatin and zetia; pt declines further medications.  Takes red yeast rice and diet    Spinal stenosis      Priority: High    NS - early, MRI    Hypothyroidism (acquired) 03/26/2013     Priority: High   Obesity (BMI  30.0-34.9) 01/22/2013     Priority: High   Diverticular disease of colon 12/26/2022     Priority: Medium    Gastroesophageal reflux disease 12/26/2022     Priority: Medium    Obesity 12/26/2022     Priority: Medium    Insomnia 09/11/2013     Priority: Medium    Osteoarthritis 04/28/2011     Priority: Medium    Osteoporosis screening 11/16/2018     Priority: Low    Normal bone density 2020; recheck 2025    Vitamin D deficiency 03/20/2017     Priority: Low   Allergic rhinitis 11/25/2009     Priority: Low    Social History: Patient  reports that she has quit smoking. Her smoking use included cigarettes. She has never used smokeless tobacco. She reports that she does not drink alcohol and does not use drugs.  Review of Systems: Ophthalmic: negative for eye pain, loss of vision or double vision Cardiovascular: negative for chest pain Respiratory: negative for SOB or persistent cough Gastrointestinal: negative for abdominal pain Genitourinary: negative for dysuria or gross hematuria MSK: negative for foot lesions Neurologic: negative for weakness or gait disturbance  Objective  Vitals: BP 128/62 Comment: by home readings  Pulse 98   Temp 98.1 F (36.7 C)   Ht 5\' 6"  (1.676 m)   Wt 190 lb 6.4 oz (86.4 kg)   SpO2 99%   BMI 30.73 kg/m  General: well appearing, no acute distress  Psych:  Alert and oriented, normal mood and affect HEENT:  Normocephalic, atraumatic, moist mucous membranes, supple neck  Cardiovascular:  Nl S1 and S2, RRR without murmur, gallop or rub. no edema Respiratory:  Good breath sounds bilaterally, CTAB with normal effort, no rales  Diabetic education: ongoing education regarding chronic disease management for diabetes was given today. We continue to reinforce the ABC's of diabetic management: A1c (<7 or 8 dependent upon patient), tight blood pressure control, and cholesterol management with goal LDL < 100 minimally. We discuss diet strategies, exercise  recommendations, medication options and possible side effects. At each visit, we review recommended immunizations and preventive care recommendations for diabetics and stress that good diabetic control can prevent other problems. See below for this patient's data.   Commons side effects, risks, benefits, and alternatives for medications and treatment plan prescribed today were discussed, and the patient expressed  understanding of the given instructions. Patient is instructed to call or message via MyChart if he/she has any questions or concerns regarding our treatment plan. No barriers to understanding were identified. We discussed Red Flag symptoms and signs in detail. Patient expressed understanding regarding what to do in case of urgent or emergency type symptoms.  Medication list was reconciled, printed and provided to the patient in AVS. Patient instructions and summary information was reviewed with the patient as documented in the AVS. This note was prepared with assistance of Dragon voice recognition software. Occasional wrong-word or sound-a-like substitutions may have occurred due to the inherent limitations of voice recognition software

## 2023-05-23 NOTE — Patient Instructions (Addendum)
 Please return in 6 months for your annual complete physical; please come fasting.    If you have any questions or concerns, please don't hesitate to send me a message via MyChart or call the office at [phone]. Thank you for visiting with Holly Riley today! It's our pleasure caring for you.   VISIT SUMMARY:  [name], during today's visit, we discussed your recent fall, blood pressure, cholesterol, diabetes, post-concussion symptoms, and follow-up from your cataract surgery. We also reviewed your general health maintenance needs.  YOUR PLAN:  -HYPERTENSION: Hypertension means high blood pressure. Your blood pressure readings have varied, and you reported feeling tired when your blood pressure is low. We will refill your current medication, lisinopril 10 mg, and you should monitor your blood pressure frequently. If your blood pressure remains high at home, please message me so I may adjust your dose.  -HYPERLIPIDEMIA: Hyperlipidemia means high cholesterol levels. Since Zetia caused leg pain, we are suggesting Nexlotol as a non-statin alternative. Please review the information provided and let me know if you would like to try it.  -DIABETES MELLITUS TYPE 2: Diabetes Mellitus Type 2 is a condition where your blood sugar levels are too high. Your diabetes is well-controlled with a recent HbA1c of 5.9%, and you do not require medication at this time.  -POST-CONCUSSION SYNDROME: Post-concussion syndrome refers to the symptoms that persist after a concussion. You are experiencing ongoing hand issues, and we recommend continuing your hand therapy exercises, including using a stress ball or Play-Doh.  -CATARACT SURGERY FOLLOW-UP: Following your cataract surgery, you are experiencing halos and difficulty reading. These halos are expected to resolve over time, and your distance vision has improved.  -GENERAL HEALTH MAINTENANCE: You are due for a bone density scan and a mammogram. Your previous bone density scan  was normal. We will order these tests for you.  INSTRUCTIONS:  Please monitor your blood pressure regularly and keep a log of your readings. We will schedule a follow-up appointment to review your blood pressure and discuss any potential medication adjustments. Additionally, please review the Nicotrol information provided and we will discuss it further at your next visit.

## 2023-05-26 ENCOUNTER — Encounter: Payer: Self-pay | Admitting: Family Medicine

## 2023-06-06 ENCOUNTER — Ambulatory Visit (INDEPENDENT_AMBULATORY_CARE_PROVIDER_SITE_OTHER): Payer: Medicare HMO

## 2023-06-06 VITALS — Ht 66.0 in | Wt 190.0 lb

## 2023-06-06 DIAGNOSIS — Z0000 Encounter for general adult medical examination without abnormal findings: Secondary | ICD-10-CM

## 2023-06-06 NOTE — Patient Instructions (Signed)
 Holly Riley , Thank you for taking time to come for your Medicare Wellness Visit. I appreciate your ongoing commitment to your health goals. Please review the following plan we discussed and let me know if I can assist you in the future.   Referrals/Orders/Follow-Ups/Clinician Recommendations: maintain health and activity   This is a list of the screening recommended for you and due dates:  Health Maintenance  Topic Date Due   COVID-19 Vaccine (1 - 2024-25 season) 06/08/2023*   DEXA scan (bone density measurement)  11/10/2023   Yearly kidney function blood test for diabetes  11/22/2023   Complete foot exam   11/22/2023   Hemoglobin A1C  11/23/2023   Mammogram  12/02/2023   Eye exam for diabetics  01/19/2024   Yearly kidney health urinalysis for diabetes  05/22/2024   Medicare Annual Wellness Visit  06/05/2024   DTaP/Tdap/Td vaccine (3 - Td or Tdap) 10/18/2024   Colon Cancer Screening  01/27/2031   Pneumonia Vaccine  Completed   Hepatitis C Screening  Completed   Zoster (Shingles) Vaccine  Completed   HPV Vaccine  Aged Out   Flu Shot  Discontinued  *Topic was postponed. The date shown is not the original due date.    Advanced directives: (Declined) Advance directive discussed with you today. Even though you declined this today, please call our office should you change your mind, and we can give you the proper paperwork for you to fill out.  Next Medicare Annual Wellness Visit scheduled for next year: Yes

## 2023-06-06 NOTE — Progress Notes (Addendum)
 Subjective:   Holly Riley is a 68 y.o. who presents for a Medicare Wellness preventive visit.  Visit Complete: Virtual I connected with  Sareena Odeh Olazabal on 06/06/23 by a audio enabled telemedicine application and verified that I am speaking with the correct person using two identifiers.  Patient Location: Home  Provider Location: Office/Clinic  I discussed the limitations of evaluation and management by telemedicine. The patient expressed understanding and agreed to proceed.  Vital Signs: Because this visit was a virtual/telehealth visit, some criteria may be missing or patient reported. Any vitals not documented were not able to be obtained and vitals that have been documented are patient reported.  VideoDeclined- This patient declined Librarian, academic. Therefore the visit was completed with audio only.  Persons Participating in Visit: Patient.  AWV Questionnaire: No: Patient Medicare AWV questionnaire was not completed prior to this visit.  Cardiac Risk Factors include: advanced age (>32men, >36 women);obesity (BMI >30kg/m2);dyslipidemia;diabetes mellitus     Objective:    Today's Vitals   06/06/23 0810  Weight: 190 lb (86.2 kg)  Height: 5\' 6"  (1.676 m)   Body mass index is 30.67 kg/m.     06/06/2023    8:15 AM 09/27/2022   11:11 AM 09/20/2022    1:40 PM 06/01/2022    8:40 AM 05/29/2021    8:52 AM 05/23/2020    8:55 AM  Advanced Directives  Does Patient Have a Medical Advance Directive? No Yes No No No No  Does patient want to make changes to medical advance directive?  No - Guardian declined      Would patient like information on creating a medical advance directive? No - Patient declined No - Patient declined No - Patient declined No - Patient declined No - Patient declined No - Patient declined    Current Medications (verified) Outpatient Encounter Medications as of 06/06/2023  Medication Sig   Cholecalciferol (VITAMIN D3) 50 MCG (2000  UT) capsule Take 2,000 Units by mouth daily.    Coenzyme Q10 (CO Q 10 PO)    Flaxseed, Linseed, (FLAX SEED OIL PO) Take 1 tablet by mouth daily.   Garlic 10 MG CAPS Take 10 mg by mouth daily.   Krill Oil 500 MG CAPS    levothyroxine (SYNTHROID) 88 MCG tablet TAKE 1 TABLET(88 MCG) BY MOUTH DAILY BEFORE BREAKFAST   lisinopril (ZESTRIL) 10 MG tablet Take 1 tablet (10 mg total) by mouth daily.   Omega-3 Fatty Acids (FISH OIL CONCENTRATE) 1000 MG CAPS Take 1,000 mg by mouth daily.   Red Yeast Rice 600 MG TABS    [DISCONTINUED] azelastine (ASTELIN) 0.1 % nasal spray Place 1 spray into both nostrils 2 (two) times daily. Use in each nostril as directed   [DISCONTINUED] Chromium-Cinnamon 818 [id number] 3328 MCG-MG CAPS    [DISCONTINUED] co-enzyme Q-10 30 MG capsule Take 30 mg by mouth daily.   [DISCONTINUED] Sod Picosulfate-Mag Ox-Cit Acd (CLENPIQ) 10-3.5-12 MG-GM -GM/160ML SOLN 160 mL orally twice for 2   No facility-administered encounter medications on file as of 06/06/2023.    Allergies (verified) Hydrocodone-acetaminophen, Morphine, Morphine and codeine, Oxycodone-acetaminophen, Ezetimibe, Hydromorphone, Statins, Percocet [oxycodone-acetaminophen], and Vicodin [hydrocodone-acetaminophen]   History: Past Medical History:  Diagnosis Date   Arthritis    Diabetes mellitus without complication (HCC)    HTN (hypertension)    Hyperlipidemia    Spinal stenosis    NS - early, MRI   Thyroid disease    Past Surgical History:  Procedure Laterality Date   ABDOMINAL HYSTERECTOMY  CARPAL TUNNEL RELEASE Right    CERVICAL FUSION     [place]   KNEE SURGERY Left    LUMBAR LAMINECTOMY     x 2, L4, L5, [place]   SPINE SURGERY     TONSILLECTOMY AND ADENOIDECTOMY     TRIGGER FINGER RELEASE Right 09/27/2022   Procedure: RELEASE TRIGGER FINGER/A-1 PULLEY RIGHT THUMB;  Surgeon: Betha Loa, MD;  Location: [facility];  Service: Orthopedics;  Laterality: Right;  30 MIN   Family History  Problem  Relation Age of Onset   Cancer Mother    Hyperlipidemia Mother    Cancer Father    Diabetes Father    Heart disease Father    Hyperlipidemia Father    Cancer Sister    Diabetes Sister    Hyperlipidemia Sister    Hyperlipidemia Sister    Diabetes Brother    Social History   Socioeconomic History   Marital status: Married    Spouse name: Not on file   Number of children: Not on file   Years of education: Not on file   Highest education level: Not on file  Occupational History   Occupation: Teacher, adult education: [employer]    Comment: Retired   Tobacco Use   Smoking status: Former    Types: Cigarettes   Smokeless tobacco: Never  Vaping Use   Vaping status: Never Used  Substance and Sexual Activity   Alcohol use: No   Drug use: No   Sexual activity: Never  Other Topics Concern   Not on file  Social History Narrative   Not on file   Social Drivers of Health   Financial Resource Strain: Low Risk  (06/06/2023)   Overall Financial Resource Strain (CARDIA)    Difficulty of Paying Living Expenses: Not hard at all  Food Insecurity: No Food Insecurity (06/06/2023)   Hunger Vital Sign    Worried About Running Out of Food in the Last Year: Never true    Ran Out of Food in the Last Year: Never true  Transportation Needs: No Transportation Needs (06/06/2023)   PRAPARE - Administrator, Civil Service (Medical): No    Lack of Transportation (Non-Medical): No  Physical Activity: Sufficiently Active (06/06/2023)   Exercise Vital Sign    Days of Exercise per Week: 7 days    Minutes of Exercise per Session: 60 min  Stress: No Stress Concern Present (06/06/2023)   [employer] of Occupational Health - Occupational Stress Questionnaire    Feeling of Stress : Not at all  Social Connections: Moderately Isolated (06/06/2023)   Social Connection and Isolation Panel [NHANES]    Frequency of Communication with Friends and Family: More than three times a week    Frequency of Social  Gatherings with Friends and Family: More than three times a week    Attends Religious Services: Never    Database administrator or Organizations: No    Attends Engineer, structural: Never    Marital Status: Married    Tobacco Counseling Counseling given: Not Answered    Clinical Intake:  Pre-visit preparation completed: Yes  Pain : No/denies pain     BMI - recorded: 30.67 Nutritional Status: BMI > 30  Obese Nutritional Risks: None Diabetes: Yes CBG done?: No Did pt. bring in CBG monitor from home?: No  Lab Results  Component Value Date   HGBA1C 5.9 (A) 05/23/2023   HGBA1C 6.1 11/23/2022   HGBA1C 5.7 (A) 05/12/2022  How often do you need to have someone help you when you read instructions, pamphlets, or other written materials from your doctor or pharmacy?: 1 - Never  Interpreter Needed?: No  Information entered by :: Lanier Ensign, LPN   Activities of Daily Living     06/06/2023    8:11 AM 09/27/2022   11:18 AM  In your present state of health, do you have any difficulty performing the following activities:  Hearing? 0 0  Vision? 0 0  Difficulty concentrating or making decisions? 0 0  Walking or climbing stairs? 0 0  Dressing or bathing? 0 0  Doing errands, shopping? 0   Preparing Food and eating ? N   Using the Toilet? N   In the past six months, have you accidently leaked urine? N   Do you have problems with loss of bowel control? N   Managing your Medications? N   Managing your Finances? N   Housekeeping or managing your Housekeeping? N     Patient Care Team: Willow Ora, MD as PCP - General (Family Medicine) Coletta Memos, MD as Consulting Physician (Neurosurgery) Janet Berlin, MD as Consulting Physician (Ophthalmology) Erroll Luna, [facility] (Inactive) (Pharmacist) Mammography, [facility] (Diagnostic Radiology)  Indicate any recent Medical Services you may have received from other than [facility] providers in the past year (date may be  approximate).     Assessment:   This is a routine wellness examination for Yuli.  Hearing/Vision screen Hearing Screening - Comments:: Pt denies any hearing issues Vision Screening - Comments:: Wears rx glasses - up to date with routine eye exams with Dr Burgess Estelle     Goals Addressed             This Visit's Progress    Patient Stated       Maintain health and activity        Depression Screen     06/06/2023    8:14 AM 05/23/2023    9:17 AM 06/01/2022    8:38 AM 05/12/2022    8:52 AM 10/19/2021    9:16 AM 05/29/2021    8:51 AM 10/21/2020    9:06 AM  PHQ 2/9 Scores  PHQ - 2 Score 0 0 0 0 0 0 0    Fall Risk     06/06/2023    8:15 AM 05/23/2023    9:17 AM 11/22/2022    8:34 AM 06/01/2022    8:41 AM 05/12/2022    8:51 AM  Fall Risk   Falls in the past year? 0 0 0 0 0  Number falls in past yr: 0 0 0 0 0  Injury with Fall? 0 0 0 0 0  Risk for fall due to : No Fall Risks No Fall Risks No Fall Risks Impaired vision No Fall Risks  Follow up Falls prevention discussed Falls evaluation completed Falls evaluation completed Falls prevention discussed Falls evaluation completed    MEDICARE RISK AT HOME:  Medicare Risk at Home Any stairs in or around the home?: Yes If so, are there any without handrails?: No Home free of loose throw rugs in walkways, pet beds, electrical cords, etc?: Yes Adequate lighting in your home to reduce risk of falls?: Yes Life alert?: No Use of a cane, walker or w/c?: No Grab bars in the bathroom?: Yes Shower chair or bench in shower?: No Elevated toilet seat or a handicapped toilet?: No  TIMED UP AND GO:  Was the test performed?  No  Cognitive Function: 6CIT  completed        06/06/2023    8:17 AM 06/01/2022    8:41 AM 05/23/2020    9:00 AM  6CIT Screen  What Year? 0 points 0 points 0 points  What month? 0 points 0 points 0 points  What time? 0 points 0 points   Count back from 20 0 points 0 points 0 points  Months in reverse 0 points 0 points 0  points  Repeat phrase 0 points 0 points 0 points  Total Score 0 points 0 points     Immunizations Immunization History  Administered Date(s) Administered   DTaP 04/27/2006   Fluad Quad(high Dose 65+) 12/29/2020   Hepatitis B, ADULT 04/28/1991   Hepatitis B, PED/ADOLESCENT 04/28/1991   Influenza Split 12/26/2007   Influenza, Quadrivalent, Recombinant, Inj, Pf 01/22/2013   Influenza, Seasonal, Injecte, Preservative Fre 12/13/2013, 11/20/2014   Influenza,inj,Quad PF,6+ Mos 12/17/2015, 01/07/2019   Influenza-Unspecified 12/14/2016, 02/08/2020   Pneumococcal Conjugate-13 11/23/2018   Pneumococcal Polysaccharide-23 08/27/2014, 07/04/2020   Tdap 10/19/2014   Zoster Recombinant(Shingrix) 11/25/2020, 05/12/2021    Screening Tests Health Maintenance  Topic Date Due   COVID-19 Vaccine (1 - 2024-25 season) 06/08/2023 (Originally 10/31/2022)   DEXA SCAN  11/10/2023   Diabetic kidney evaluation - eGFR measurement  11/22/2023   FOOT EXAM  11/22/2023   HEMOGLOBIN A1C  11/23/2023   MAMMOGRAM  12/02/2023   OPHTHALMOLOGY EXAM  01/19/2024   Diabetic kidney evaluation - Urine ACR  05/22/2024   Medicare Annual Wellness (AWV)  06/05/2024   DTaP/Tdap/Td (3 - Td or Tdap) 10/18/2024   Colonoscopy  01/27/2031   Pneumonia Vaccine 28+ Years old  Completed   Hepatitis C Screening  Completed   Zoster Vaccines- Shingrix  Completed   HPV VACCINES  Aged Out   INFLUENZA VACCINE  Discontinued    Health Maintenance  There are no preventive care reminders to display for this patient.  Health Maintenance Items Addressed: See Nurse Notes  Additional Screening:  Vision Screening: Recommended annual ophthalmology exams for early detection of glaucoma and other disorders of the eye.  Dental Screening: Recommended annual dental exams for proper oral hygiene  Community Resource Referral / Chronic Care Management: CRR required this visit?  No   CCM required this visit?  No     Plan:     I have  personally reviewed and noted the following in the patient's chart:   Medical and social history Use of alcohol, tobacco or illicit drugs  Current medications and supplements including opioid prescriptions. Patient is not currently taking opioid prescriptions. Functional ability and status Nutritional status Physical activity Advanced directives List of other physicians Hospitalizations, surgeries, and ER visits in previous 12 months Vitals Screenings to include cognitive, depression, and falls Referrals and appointments  In addition, I have reviewed and discussed with patient certain preventive protocols, quality metrics, and best practice recommendations. A written personalized care plan for preventive services as well as general preventive health recommendations were provided to patient.     Marzella Schlein, LPN   11/05/2950   After Visit Summary: (MyChart) Due to this being a telephonic visit, the after visit summary with patients personalized plan was offered to patient via MyChart   Notes: Nothing significant to report at this time.

## 2023-07-06 DIAGNOSIS — H10413 Chronic giant papillary conjunctivitis, bilateral: Secondary | ICD-10-CM | POA: Diagnosis not present

## 2023-07-06 DIAGNOSIS — Z961 Presence of intraocular lens: Secondary | ICD-10-CM | POA: Diagnosis not present

## 2023-07-13 ENCOUNTER — Other Ambulatory Visit: Payer: Self-pay | Admitting: Family Medicine

## 2023-07-13 NOTE — Telephone Encounter (Signed)
 Copied from CRM [id number]. Topic: Clinical - Medication Refill >> Jul 13, 2023 11:21 AM Deaijah H wrote: Medication: levothyroxine  (SYNTHROID ) 88 MCG tablet  Has the patient contacted their pharmacy? Yes (Agent: If no, request that the patient contact the pharmacy for the refill. If patient does not wish to contact the pharmacy document the reason why and proceed with request.) (Agent: If yes, when and what did the pharmacy advise?)   This is the patient's preferred pharmacy:  [facility]  [address] [place] [address] Phone: [phone] Is this the correct pharmacy for this prescription? Yes If no, delete pharmacy and type the correct one.   Has the prescription been filled recently? Yes  Is the patient out of the medication? No, week left  Has the patient been seen for an appointment in the last year OR does the patient have an upcoming appointment? Yes  Can we respond through MyChart? Yes  Agent: Please be advised that Rx refills may take up to 3 business days. We ask that you follow-up with your pharmacy.

## 2023-07-14 MED ORDER — LEVOTHYROXINE SODIUM 88 MCG PO TABS: 88.0000 ug | ORAL_TABLET | Freq: Every day | ORAL | 3 refills | Status: AC

## 2023-07-27 ENCOUNTER — Ambulatory Visit (INDEPENDENT_AMBULATORY_CARE_PROVIDER_SITE_OTHER): Admitting: Family Medicine

## 2023-07-27 ENCOUNTER — Encounter: Payer: Self-pay | Admitting: Family Medicine

## 2023-07-27 VITALS — BP 128/75 | HR 78 | Temp 97.9°F | Ht 66.0 in | Wt 190.2 lb

## 2023-07-27 DIAGNOSIS — J0100 Acute maxillary sinusitis, unspecified: Secondary | ICD-10-CM

## 2023-07-27 DIAGNOSIS — M542 Cervicalgia: Secondary | ICD-10-CM | POA: Diagnosis not present

## 2023-07-27 DIAGNOSIS — G8929 Other chronic pain: Secondary | ICD-10-CM | POA: Diagnosis not present

## 2023-07-27 DIAGNOSIS — J301 Allergic rhinitis due to pollen: Secondary | ICD-10-CM

## 2023-07-27 DIAGNOSIS — M62830 Muscle spasm of back: Secondary | ICD-10-CM | POA: Diagnosis not present

## 2023-07-27 DIAGNOSIS — I10 Essential (primary) hypertension: Secondary | ICD-10-CM | POA: Insufficient documentation

## 2023-07-27 MED ORDER — FLUTICASONE PROPIONATE 50 MCG/ACT NA SUSP: 1.0000 | Freq: Every day | NASAL | 6 refills | Status: DC

## 2023-07-27 MED ORDER — LISINOPRIL 20 MG PO TABS: 20.0000 mg | ORAL_TABLET | Freq: Every day | ORAL | 3 refills | Status: DC

## 2023-07-27 MED ORDER — AMOXICILLIN-POT CLAVULANATE 875-125 MG PO TABS: 1.0000 | ORAL_TABLET | Freq: Two times a day (BID) | ORAL | 0 refills | Status: AC

## 2023-07-27 MED ORDER — MONTELUKAST SODIUM 10 MG PO TABS: 10.0000 mg | ORAL_TABLET | Freq: Every day | ORAL | 3 refills | Status: DC

## 2023-07-27 NOTE — Patient Instructions (Addendum)
 Please follow up as scheduled for your next visit with me: 11/29/2023   If you have any questions or concerns, please don't hesitate to send me a message via MyChart or call the office at [phone]. Thank you for visiting with us  today! It's our pleasure caring for you.    VISIT SUMMARY: Today, you were seen for concerns about sinus issues and a tender lymph node. You have been experiencing severe allergy symptoms, including congestion, nasal drainage, and ear pain. You also have a history of hypertension, and your blood pressure readings have been inconsistent. We discussed your family history of cancer and severe allergies, as well as the stress you are experiencing due to family health issues.  YOUR PLAN: -SINUSITIS: Sinusitis is an inflammation of the sinuses that can cause facial pain, toothache, ear pain, nasal congestion, and discharge. You will start antibiotics and Mucinex , and restart the steroid nasal spray for 2-4 weeks to help manage your symptoms.  -ALLERGIC RHINITIS: Allergic rhinitis is an allergic reaction that causes sneezing, congestion, and a runny nose. We recommend starting Singulair and restarting the steroid nasal spray for short-term use. We may also consider referring you to an allergist.  -HYPERTENSION: Hypertension is high blood pressure, which can lead to serious health problems if not managed properly. We will increase your lisinopril  dosage to 20 mg to help better control your blood pressure.  -MUSCULOSKELETAL PAIN RELATED TO STRESS: Stress can cause muscle pain and discomfort. We recommend massage therapy or physical therapy, and we provided you with contact information for Cristine Done at [facility] for fascial release therapy.  [email or web address] You may also consider acupuncture to help manage your stress-related pain.  INSTRUCTIONS: Please follow up with us  if your symptoms do not improve or if you experience any new or worsening symptoms.  Additionally, consider scheduling an appointment with an allergist if your allergy symptoms persist. Continue monitoring your blood pressure at home and keep a log of your readings. If you have any questions or concerns, do not hesitate to contact our office.                      Contains text generated by Abridge.                                 Contains text generated by Abridge.

## 2023-07-27 NOTE — Progress Notes (Signed)
 Subjective  CC:  Chief Complaint  Patient presents with   Sinus Problem    HPI: Holly Riley is a 68 y.o. female who presents to the office today to address the problems listed above in the chief complaint. Discussed the use of AI scribe software for clinical note transcription with the patient, who gave verbal consent to proceed.  History of Present Illness Holly Riley is a 68 year old female with hypertension who presents with concerns about sinus issues and a tender lymph node.  Approximately two weeks ago, she experienced a tender lymph node under her arm, which resolved after about a week. There was no associated skin irritation, rash, or drainage, and she has no history of infections in that area.  She has been experiencing significant allergy symptoms this year, described as severe, with congestion, clear and green nasal drainage, and epistaxis. Stress exacerbates her allergies, leading to pain in her clavicle, scapula, jaw, and ear. Ear pain has persisted for the past four to five days. Sinus-related symptoms include facial pain and toothache.  She has a history of hypertension and is currently taking lisinopril . Her blood pressure readings have been inconsistent. She does not cook with salt, except for occasional use of sea salt.  Home readings average 130 over 70s.  Highest readings are 140s over 70s.  Some readings are 120s over 70s.  She feels well.  No chest pain or shortness of breath.  Family history is notable for her oldest sister having had hormone receptor-positive breast cancer and uterine cancer, now diagnosed with liver cancer, which has been a source of stress for her. There is also a family history of severe allergies affecting her grandson and son.  She is very anxious about her wellbeing of her sister.  She uses loratadine for allergies but experiences itching. She previously used Flonase and Nasonex nasal sprays, which increased epistaxis. She uses saline  nasal spray daily to manage symptoms.   Assessment  1. White coat syndrome with diagnosis of hypertension   2. Acute non-recurrent maxillary sinusitis   3. Spasm of right trapezius muscle   4. Chronic neck pain   5. Seasonal allergic rhinitis due to pollen      Plan  Assessment and Plan Assessment & Plan Sinusitis Acute sinusitis with facial pain, toothache, ear pain, nasal congestion, green nasal discharge, and epistaxis for 4-5 days. Likely exacerbated by allergic rhinitis. - Prescribe antibiotics.  Augmentin  twice daily for 10 days - Recommend Mucinex . - Restart steroid nasal spray for 2-4 weeks.  Flonase ordered  Allergic rhinitis Chronic allergic rhinitis exacerbated by seasonal allergies. Discussed Singulair benefits and short-term steroid nasal spray use. - Recommend Singulair.  In addition to Xyzal  - Restart steroid nasal spray for short-term use.  Hypertension Blood pressure borderline and fluctuating but fairly well-controlled overall. Current lisinopril  management suboptimal.  - Increase lisinopril  to 20 mg. - Continue home monitoring  Musculoskeletal pain related to stress Significant stress due to family health issues contributing to musculoskeletal discomfort and history of cervical spine fusion. - Recommend massage therapy or physical therapy. - Provide contact for [facility] for fascial release therapy. - Consider acupuncture.    Follow up: September for complete physical as scheduled No orders of the defined types were placed in this encounter.  Meds ordered this encounter  Medications   lisinopril  (ZESTRIL ) 20 MG tablet    Sig: Take 1 tablet (20 mg total) by mouth daily.    Dispense:  90 tablet  Refill:  3    Increasing dose up from 10mg  daily.   amoxicillin -clavulanate (AUGMENTIN ) 875-125 MG tablet    Sig: Take 1 tablet by mouth 2 (two) times daily for 10 days.    Dispense:  20 tablet    Refill:  0   fluticasone (FLONASE) 50 MCG/ACT nasal  spray    Sig: Place 1 spray into both nostrils daily.    Dispense:  16 g    Refill:  6   montelukast (SINGULAIR) 10 MG tablet    Sig: Take 1 tablet (10 mg total) by mouth at bedtime.    Dispense:  30 tablet    Refill:  3     I reviewed the patients updated PMH, FH, and SocHx.  Patient Active Problem List   Diagnosis Date Noted   White coat syndrome with diagnosis of hypertension 07/27/2023    Priority: High   History of colonic polyps 12/26/2022    Priority: High   Family history of colon cancer 12/26/2022    Priority: High   Statin intolerance 03/20/2017    Priority: High   Controlled type 2 diabetes mellitus without complication, without long-term current use of insulin  (HCC) 02/17/2017    Priority: High   Combined hyperlipidemia associated with type 2 diabetes mellitus (HCC) 02/17/2017    Priority: High   Spinal stenosis     Priority: High   Hypothyroidism (acquired) 03/26/2013    Priority: High   Obesity (BMI 30.0-34.9) 01/22/2013    Priority: High   Diverticular disease of colon 12/26/2022    Priority: Medium    Gastroesophageal reflux disease 12/26/2022    Priority: Medium    Obesity 12/26/2022    Priority: Medium    Insomnia 09/11/2013    Priority: Medium    Osteoarthritis 04/28/2011    Priority: Medium    Osteoporosis screening 11/16/2018    Priority: Low   Vitamin D  deficiency 03/20/2017    Priority: Low   Allergic rhinitis 11/25/2009    Priority: Low   Current Meds  Medication Sig   amoxicillin -clavulanate (AUGMENTIN ) 875-125 MG tablet Take 1 tablet by mouth 2 (two) times daily for 10 days.   fluticasone (FLONASE) 50 MCG/ACT nasal spray Place 1 spray into both nostrils daily.   montelukast (SINGULAIR) 10 MG tablet Take 1 tablet (10 mg total) by mouth at bedtime.   Allergies: Patient is allergic to hydrocodone-acetaminophen , morphine, morphine and codeine , oxycodone -acetaminophen , ezetimibe , hydromorphone, statins, percocet [oxycodone -acetaminophen ],  and vicodin [hydrocodone-acetaminophen ]. Family History: Patient family history includes Breast cancer in her sister; Cancer in her father, mother, and sister; Diabetes in her brother, father, and sister; Heart disease in her father; Hyperlipidemia in her father, mother, sister, and sister. Social History:  Patient  reports that she has quit smoking. Her smoking use included cigarettes. She has never used smokeless tobacco. She reports that she does not drink alcohol and does not use drugs.  Review of Systems: Constitutional: Negative for fever malaise or anorexia Cardiovascular: negative for chest pain Respiratory: negative for SOB or persistent cough Gastrointestinal: negative for abdominal pain  Objective  Vitals: BP 128/75 Comment: by avg home readings: highest 147/81  Pulse 78   Temp 97.9 F (36.6 C)   Ht 5\' 6"  (1.676 m)   Wt 190 lb 3.2 oz (86.3 kg)   SpO2 98%   BMI 30.70 kg/m  General: no acute distress , A&Ox3 HEENT: PEERL, conjunctiva normal, neck is supple, right TM normal, no cervical lymphadenopathy, nasal mucosa is boggy and inflamed, left  sinus tenderness present Right axilla without lymphadenopathy or mass Cardiovascular:  RRR without murmur or gallop.  Respiratory:  Good breath sounds bilaterally, CTAB with normal respiratory effort Skin:  Warm, no rashes Commons side effects, risks, benefits, and alternatives for medications and treatment plan prescribed today were discussed, and the patient expressed understanding of the given instructions. Patient is instructed to call or message via MyChart if he/she has any questions or concerns regarding our treatment plan. No barriers to understanding were identified. We discussed Red Flag symptoms and signs in detail. Patient expressed understanding regarding what to do in case of urgent or emergency type symptoms.  Medication list was reconciled, printed and provided to the patient in AVS. Patient instructions and summary  information was reviewed with the patient as documented in the AVS. This note was prepared with assistance of Dragon voice recognition software. Occasional wrong-word or sound-a-like substitutions may have occurred due to the inherent limitations of voice recognition software

## 2023-08-04 ENCOUNTER — Encounter: Payer: Self-pay | Admitting: Family Medicine

## 2023-08-04 DIAGNOSIS — Z122 Encounter for screening for malignant neoplasm of respiratory organs: Secondary | ICD-10-CM

## 2023-08-04 DIAGNOSIS — Z87891 Personal history of nicotine dependence: Secondary | ICD-10-CM

## 2023-08-05 ENCOUNTER — Encounter: Payer: Self-pay | Admitting: Family Medicine

## 2023-08-05 NOTE — Addendum Note (Signed)
 Addended by: Karma Oz on: 08/05/2023 12:33 PM   Modules accepted: Orders

## 2023-08-24 ENCOUNTER — Telehealth: Payer: Self-pay | Admitting: Acute Care

## 2023-08-24 ENCOUNTER — Other Ambulatory Visit: Payer: Self-pay

## 2023-08-24 DIAGNOSIS — Z87891 Personal history of nicotine dependence: Secondary | ICD-10-CM

## 2023-08-24 DIAGNOSIS — Z122 Encounter for screening for malignant neoplasm of respiratory organs: Secondary | ICD-10-CM

## 2023-08-24 NOTE — Telephone Encounter (Signed)
 Lung Cancer Screening Narrative/Criteria Questionnaire (Cigarette Smokers Only- No Cigars/Pipes/vapes)   [name]   SDMV:09/06/23 at 1030a/[name]                                           [birth year]               LDCT: 09/07/23 at 1130a/[facility]    68 y.o.   Phone: [phone]  Lung Screening Narrative (confirm age 8-77 yrs Medicare / 50-80 yrs Private pay insurance)   Insurance information:[employer]     Referring Provider:Andy   This screening involves an initial phone call with a team member from our program. It is called a shared decision making visit. The initial meeting is required by insurance and Medicare to make sure you understand the program. This appointment takes about 15-20 minutes to complete. The CT scan will completed at a separate date/time. This scan takes about 5-10 minutes to complete and you may eat and drink before and after the scan.  Criteria questions for Lung Cancer Screening:   Are you a current or former smoker? Former Age began smoking: 14y   If you are a former smoker, what year did you quit smoking? 2011 (add'l 4 years non smoker)   To calculate your smoking history, I need an accurate estimate of how many packs of cigarettes you smoked per day and for how many years. (Not just the number of PPD you are now smoking)   Years smoking 35 x Packs per day  1 = Pack years 35   (at least 20 pack yrs)   (Make sure they understand that we need to know how much they have smoked in the past, not just the number of PPD they are smoking now)  Do you have a personal history of cancer?  No    Do you have a family history of cancer? Yes  (cancer type and and relative) mother lung/father kidney/ sister breast, ovarian, liver  Are you coughing up blood?  No  Have you had unexplained weight loss of 15 lbs or more in the last 6 months? No  It looks like you meet all criteria.     Additional information: N/A

## 2023-09-06 ENCOUNTER — Ambulatory Visit (INDEPENDENT_AMBULATORY_CARE_PROVIDER_SITE_OTHER): Admitting: *Deleted

## 2023-09-06 ENCOUNTER — Encounter: Payer: Self-pay | Admitting: *Deleted

## 2023-09-06 DIAGNOSIS — Z87891 Personal history of nicotine dependence: Secondary | ICD-10-CM | POA: Diagnosis not present

## 2023-09-06 NOTE — Patient Instructions (Signed)

## 2023-09-06 NOTE — Progress Notes (Signed)
  Virtual Visit via Telephone Note  I connected with Lorissa Kishbaugh Tom on 09/06/23 at 10:30 AM EDT by telephone and verified that I am speaking with the correct person using two identifiers.  Location: Patient: in home Provider: [address] [place], [place], [address]    Shared Decision Making Visit Lung Cancer Screening Program [phone])   Eligibility: Age 68 y.o. Pack Years Smoking History Calculation 35 (# packs/per year x # years smoked) Recent History of coughing up blood  no Unexplained weight loss? no ( >Than 15 pounds within the last 6 months ) Prior History Lung / other cancer no (Diagnosis within the last 5 years already requiring surveillance chest CT Scans). Smoking Status Former Smoker Former Smokers: Years since quit: 14 years  Quit Date: 03-02-19  Visit Components: Discussion included one or more decision making aids. yes Discussion included risk/benefits of screening. yes Discussion included potential follow up diagnostic testing for abnormal scans. yes Discussion included meaning and risk of over diagnosis. yes Discussion included meaning and risk of False Positives. yes Discussion included meaning of total radiation exposure. yes  Counseling Included: Importance of adherence to annual lung cancer LDCT screening. yes Impact of comorbidities on ability to participate in the program. yes Ability and willingness to under diagnostic treatment. yes  Smoking Cessation Counseling: Current Smokers:  Discussed importance of smoking cessation. yes Information about tobacco cessation classes and interventions provided to patient. yes Patient provided with ticket for LDCT Scan. yes Symptomatic Patient. no  Counseling NA Diagnosis Code: Tobacco Use Z72.0 Asymptomatic Patient yes  Counseling (Intermediate counseling: > three minutes counseling) H9563 Former Smokers:  Discussed the importance of maintaining cigarette abstinence. yes Diagnosis Code: Personal  History of Nicotine Dependence. S12.108 Information about tobacco cessation classes and interventions provided to patient. Yes Patient provided with ticket for LDCT Scan. yes Written Order for Lung Cancer Screening with LDCT placed in Epic. Yes (CT Chest Lung Cancer Screening Low Dose W/O CM) [id number] Z12.2-Screening of respiratory organs Z87.891-Personal history of nicotine dependence   Josette Ranger, RN 09/06/23  .

## 2023-09-07 ENCOUNTER — Ambulatory Visit (HOSPITAL_COMMUNITY)
Admission: RE | Admit: 2023-09-07 | Discharge: 2023-09-07 | Disposition: A | Discharge status: Home / Self Care | Source: Ambulatory Visit | Attending: Family Medicine | Admitting: Family Medicine

## 2023-09-07 DIAGNOSIS — Z122 Encounter for screening for malignant neoplasm of respiratory organs: Secondary | ICD-10-CM | POA: Insufficient documentation

## 2023-09-07 DIAGNOSIS — Z87891 Personal history of nicotine dependence: Secondary | ICD-10-CM | POA: Insufficient documentation

## 2023-09-09 DIAGNOSIS — H11001 Unspecified pterygium of right eye: Secondary | ICD-10-CM | POA: Diagnosis not present

## 2023-09-09 DIAGNOSIS — H1131 Conjunctival hemorrhage, right eye: Secondary | ICD-10-CM | POA: Diagnosis not present

## 2023-09-13 ENCOUNTER — Telehealth: Payer: Self-pay

## 2023-09-13 NOTE — Telephone Encounter (Signed)
 Call Report from Tiffany  IMPRESSION: Lung-RADS 4A, suspicious. Follow up low-dose chest CT without contrast in 3 months (please use the following order, CT CHEST LCS NODULE FOLLOW-UP W/O CM) is recommended. Dependent right lower lobe 13.8 mm density which could represent atelectasis or a mixed attenuation nodule.   Aortic atherosclerosis (ICD10-I70.0) and emphysema (ICD10-J43.9).   Left adrenal adenoma. In the absence of clinically indicated signs/symptoms require(s) no independent follow-up.

## 2023-09-14 ENCOUNTER — Telehealth: Payer: Self-pay | Admitting: Acute Care

## 2023-09-14 NOTE — Telephone Encounter (Signed)
 I have called the patient with the results of her low-dose screening CT.  Her scan was read as a lung RADS 4A.  This is her first scan.  She has a 13.8 mm partially solid nodule.  Recommendation per radiology is for a 11-month follow-up.  I did discuss with the patient the possibility of PET scan now versus 57-month follow-up and she feels comfortable with the 33-month follow-up. She does have a significant family history of cancer.  Her sister is currently battling lung cancer, breast cancer, metastasis to the spine.  Patient's mother died of lung cancer and she was a never smoker.  Patient's father has renal cancer.  Please place order for 40-month follow-up low-dose CT.  This will be due about the second week in October 2025.  Please schedule patient to follow-up with me in the office to review the results.  She understands that if the nodule shows any growth in this short interval that next best steps will most likely be for a PET scan and a biopsy.  Patient verbalized understanding of the above.  Please fax results to PCP and let them know plan of care.  Thank you so much

## 2023-09-15 ENCOUNTER — Other Ambulatory Visit: Payer: Self-pay

## 2023-09-15 DIAGNOSIS — R911 Solitary pulmonary nodule: Secondary | ICD-10-CM

## 2023-09-15 DIAGNOSIS — Z87891 Personal history of nicotine dependence: Secondary | ICD-10-CM

## 2023-09-15 DIAGNOSIS — Z122 Encounter for screening for malignant neoplasm of respiratory organs: Secondary | ICD-10-CM

## 2023-09-15 NOTE — Telephone Encounter (Signed)
 See provider note 09/14/2023

## 2023-09-15 NOTE — Telephone Encounter (Signed)
 Order placed. Reminder set to call patient in September to schedule CT and OV.

## 2023-09-22 ENCOUNTER — Other Ambulatory Visit: Payer: Self-pay

## 2023-09-22 DIAGNOSIS — R911 Solitary pulmonary nodule: Secondary | ICD-10-CM

## 2023-09-22 DIAGNOSIS — Z87891 Personal history of nicotine dependence: Secondary | ICD-10-CM

## 2023-09-22 DIAGNOSIS — Z122 Encounter for screening for malignant neoplasm of respiratory organs: Secondary | ICD-10-CM

## 2023-09-22 NOTE — Telephone Encounter (Signed)
 Patient has called back and now states she wants to get the PET scan done

## 2023-09-23 DIAGNOSIS — Z79890 Hormone replacement therapy (postmenopausal): Secondary | ICD-10-CM | POA: Diagnosis not present

## 2023-09-23 DIAGNOSIS — Z8249 Family history of ischemic heart disease and other diseases of the circulatory system: Secondary | ICD-10-CM | POA: Diagnosis not present

## 2023-09-23 DIAGNOSIS — E063 Autoimmune thyroiditis: Secondary | ICD-10-CM | POA: Diagnosis not present

## 2023-09-23 DIAGNOSIS — Z0001 Encounter for general adult medical examination with abnormal findings: Secondary | ICD-10-CM | POA: Diagnosis not present

## 2023-09-23 DIAGNOSIS — E559 Vitamin D deficiency, unspecified: Secondary | ICD-10-CM | POA: Diagnosis not present

## 2023-09-23 LAB — BASIC METABOLIC PANEL WITH GFR
Creatinine: 0.8 (ref 0.5–1.1)
Glucose: 117
Potassium: 4.1 meq/L (ref 3.5–5.1)
Sodium: 141 (ref 137–147)

## 2023-09-23 LAB — HEPATIC FUNCTION PANEL
A1c: 6.3
ALT: 16 U/L (ref 7–35)
AST: 16 (ref 13–35)
EGFR: 82
Free T4: 1.6
PTH: 33
TSH: 2.15 (ref 0.41–5.90)

## 2023-09-23 LAB — CBC AND DIFFERENTIAL
HCT: 44 (ref 36–46)
Hemoglobin: 14.8 (ref 12.0–16.0)
Platelets: 275 K/uL (ref 150–400)
WBC: 6.6

## 2023-09-23 LAB — HEMOGLOBIN A1C: Hemoglobin A1C: 6.3

## 2023-09-23 LAB — LIPID PANEL
Cholesterol: 230 — AB (ref 0–200)
HDL: 77 — AB (ref 35–70)
LDL Cholesterol: 137
Triglycerides: 67 (ref 40–160)

## 2023-09-23 LAB — CBC: RBC: 4.49 (ref 3.87–5.11)

## 2023-09-23 LAB — COMPREHENSIVE METABOLIC PANEL WITH GFR: eGFR: 82

## 2023-09-23 LAB — TSH: TSH: 2.15 (ref 0.41–5.90)

## 2023-09-23 LAB — VITAMIN D 25 HYDROXY (VIT D DEFICIENCY, FRACTURES): Vit D, 25-Hydroxy: 49

## 2023-10-07 ENCOUNTER — Ambulatory Visit (HOSPITAL_COMMUNITY)
Admission: RE | Admit: 2023-10-07 | Discharge: 2023-10-07 | Disposition: A | Discharge status: Home / Self Care | Source: Ambulatory Visit | Attending: Acute Care | Admitting: Acute Care

## 2023-10-07 DIAGNOSIS — Z87891 Personal history of nicotine dependence: Secondary | ICD-10-CM | POA: Diagnosis not present

## 2023-10-07 DIAGNOSIS — R911 Solitary pulmonary nodule: Secondary | ICD-10-CM | POA: Insufficient documentation

## 2023-10-07 DIAGNOSIS — Z122 Encounter for screening for malignant neoplasm of respiratory organs: Secondary | ICD-10-CM | POA: Diagnosis not present

## 2023-10-07 LAB — GLUCOSE, CAPILLARY: Glucose-Capillary: 149 mg/dL — ABNORMAL HIGH (ref 70–99)

## 2023-10-07 MED ORDER — FLUDEOXYGLUCOSE F - 18 (FDG) INJECTION
9.5000 | Freq: Once | INTRAVENOUS | Status: AC | PRN
  Administered 2023-10-07: 9.6 via INTRAVENOUS

## 2023-10-17 ENCOUNTER — Encounter: Payer: Self-pay | Admitting: Acute Care

## 2023-10-17 ENCOUNTER — Ambulatory Visit: Admitting: Acute Care

## 2023-10-17 VITALS — BP 142/82 | HR 86 | Temp 98.1°F | Ht 66.0 in | Wt 192.4 lb

## 2023-10-17 DIAGNOSIS — R911 Solitary pulmonary nodule: Secondary | ICD-10-CM

## 2023-10-17 DIAGNOSIS — R9389 Abnormal findings on diagnostic imaging of other specified body structures: Secondary | ICD-10-CM

## 2023-10-17 DIAGNOSIS — R918 Other nonspecific abnormal finding of lung field: Secondary | ICD-10-CM

## 2023-10-17 DIAGNOSIS — Z122 Encounter for screening for malignant neoplasm of respiratory organs: Secondary | ICD-10-CM

## 2023-10-17 DIAGNOSIS — Z87891 Personal history of nicotine dependence: Secondary | ICD-10-CM

## 2023-10-17 DIAGNOSIS — R942 Abnormal results of pulmonary function studies: Secondary | ICD-10-CM

## 2023-10-17 NOTE — Patient Instructions (Addendum)
 It is good to see you today.  We have discussed the results of your PET scan. There is some low level metabolic activity on the PET scan that is concerning for lung cancer. I have recommended a biopsy for definitive diagnosis, or referral to surgery to be evaluated . You prefer to take some time to make this decision , which is fine. Please do not wait too long.  There is always the chance this could grow.  Follow up with me in 2 weeks to see if you have made a decision. If you make a decision earlier, please call the office and we can do a video visit to make any referrals or schedule any procedures.  I have ordered PFT's to determine if you are a candidate for surgery. You will get a call to get this scheduled. Call if you need us  sooner . Please contact office for sooner follow up if symptoms do not improve or worsen or seek emergency care

## 2023-10-17 NOTE — Progress Notes (Signed)
 History of Present Illness Holly Riley is a 68 y.o. female former smoker followed by the lung cancer screening program. She is here for consult 09/2023 after abnormal chest imaging.    10/17/2023 Discussed the use of AI scribe software for clinical note transcription with the patient, who gave verbal consent to proceed.  History of Present Illness Pt. Presents for follow up to review PET scan which was done after an abnormal low-dose CT screening scan.  Low-dose CT was actually read as a lung RADS 4A however the size of the lesion was 13.8 mm in the right lower lobe and was big enough for pet imaging.  There was also notation of a 4.9 mm nodule along the right major fissure.  The patient opted for PET scan.  We reviewed the PET scan today.  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  The 4.9 mm along the right major fissure was not large enough for PET avidity. I discussed with the patient that the next best step is for biopsy for definitive tissue diagnosis.  Patient is a retired Char[employer] fundraiser and has the personal belief that when she biopsy cancer you seated and it spreads throughout the body.  She needs to pray and discuss with her family whether or not she wants to move forward with the biopsy.  I did give her the option of referral to surgery to have the nodules removed.  This would be a single anesthetic event.  We discussed that the nodules would be removed and then determination of whether not they are malignant will be done postoperatively.  The risk here is that this may not be a cancer and she would have gone through with surgery unnecessarily.  Again the patient needs to think about this for a while. We have set up a 2-week follow-up visit.  I explained to her that it is our recommendation that she he either have a biopsy to determine whether or not this is a cancer or will be referred to thoracic surgery  to investigate the option of surgery.  I will go ahead and order pulmonary function testing to make sure she is a surgical candidate.  She is currently not a smoker.  She denies any weight loss, or hemoptysis.  She is a retired Engineer, civil (consulting).  She has family history of cancer in her mother, her father, and her  sister who had breast cancer and is a cancer survivor. She understands that this could potentially grow and spread if she waits too long to address her options. We will see her again in 2 weeks to discuss her decision.     Test Results: PET scan 10/07/2023 Lung-RADS 4A, suspicious. Follow up low-dose chest CT without contrast in 3 months (please use the following order, CT CHEST LCS NODULE FOLLOW-UP W/O CM) is recommended. Dependent right lower lobe 13.8 mm density which could represent atelectasis or a mixed attenuation nodule.   Aortic atherosclerosis (ICD10-I70.0) and emphysema (ICD10-J43.9).   Left adrenal adenoma. In the absence of clinically indicated signs/symptoms require(s) no independent follow-up.   These results will be called to the ordering clinician or representative by the Radiologist Assistant, and communication documented in the PACS or [employer].     LDCT 09/12/2023 Lungs/Pleura: No pleural fluid. Mild centrilobular emphysema. Nodule along the right major fissure measures 4.9 mm.   Dependent right lower lobe density, favored to be sub solid or mixed attenuation,  measures 13.8 mm. This is characterized as solid on the DynaCAD software.   Upper Abdomen: Normal imaged portions of the liver, spleen, stomach, pancreas, gallbladder, right adrenal gland, kidneys. Left adrenal nodule of 1.8 cm measures -2 HU.   Musculoskeletal: Cervical spine fixation.   IMPRESSION: Lung-RADS 4A, suspicious. Follow up low-dose chest CT without contrast in 3 months (please use the following order, CT CHEST LCS NODULE FOLLOW-UP W/O CM) is recommended. Dependent right lower  lobe 13.8 mm density which could represent atelectasis or a mixed attenuation nodule.   Aortic atherosclerosis (ICD10-I70.0) and emphysema (ICD10-J43.9).   Left adrenal adenoma. In the absence of clinically indicated signs/symptoms require(s) no independent follow-up.   These results will be called to the ordering clinician or representative by the Radiologist Assistant, and communication documented in the PACS or [employer].     Latest Ref Rng & Units 11/22/2022    9:12 AM 10/19/2021    9:41 AM 10/21/2020    9:37 AM  CBC  WBC 4.0 - 10.5 K/uL 6.7  7.3  5.7   Hemoglobin 12.0 - 15.0 g/dL 85.0  85.1  85.7   Hematocrit 36.0 - 46.0 % 45.1  44.7  42.2   Platelets 150.0 - 400.0 K/uL 276.0  258.0  273.0        Latest Ref Rng & Units 11/22/2022    9:12 AM 10/19/2021    9:41 AM 12/31/2020    1:47 PM  BMP  Glucose 70 - 99 mg/dL 865  874  873   BUN 6 - 23 mg/dL 17  12  22    Creatinine 0.40 - 1.20 mg/dL 9.17  9.17  9.00   Sodium 135 - 145 mEq/L 140  141  139   Potassium 3.5 - 5.1 mEq/L 4.0  3.8  3.9   Chloride 96 - 112 mEq/L 106  104  102   CO2 19 - 32 mEq/L 26  27  29    Calcium  8.4 - 10.5 mg/dL 9.2  9.1  9.5     BNP No results found for: BNP  ProBNP No results found for: PROBNP  PFT No results found for: FEV1PRE, FEV1POST, FVCPRE, FVCPOST, TLC, DLCOUNC, PREFEV1FVCRT, PSTFEV1FVCRT  NM PET Image Initial (PI) Skull Base To Thigh Result Date: 10/14/2023 CLINICAL DATA:  Initial treatment strategy for lung nodule. EXAM: NUCLEAR MEDICINE PET SKULL BASE TO THIGH TECHNIQUE: 9.6 mCi F-18 FDG was injected intravenously. Full-ring PET imaging was performed from the skull base to thigh after the radiotracer. CT data was obtained and used for attenuation correction and anatomic localization. Fasting blood glucose: 149 mg/dl COMPARISON:  CT chest 92/90/7974. FINDINGS: Mediastinal blood pool activity: SUV max 3.7 Liver activity: SUV max NA NECK: No abnormal hypermetabolism.  Incidental CT findings: None. CHEST: Persistent ground-glass nodule in the posterior right lower lobe, 1.4 x 1.5 cm (7/44), SUV max 2.7. No additional abnormal hypermetabolism. Incidental CT findings: Atherosclerotic calcification of the aorta and coronary arteries. Heart is enlarged. No pericardial or pleural effusion. ABDOMEN/PELVIS: No abnormal hypermetabolism. Incidental CT findings: Left adrenal adenoma.  No specific follow-up necessary. SKELETON: No abnormal hypermetabolism. Incidental CT findings: Degenerative changes in the spine. IMPRESSION: 1. 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 2. Left adrenal adenoma. 3. Aortic atherosclerosis (ICD10-I70.0). Coronary artery calcification. Electronically Signed   By: Newell Eke M.D.   On: 10/14/2023 11:12     Past medical hx Past Medical History:  Diagnosis Date  Allergy    seasonal and cats   Arthritis    Cataract    Diabetes mellitus without complication (HCC)    HTN (hypertension)    Hyperlipidemia    Spinal stenosis    NS - early, MRI   Thyroid  disease      Social History   Tobacco Use   Smoking status: Former    Current packs/day: 0.00    Average packs/day: 0.8 packs/day for 40.0 years (30.0 ttl pk-yrs)    Types: Cigarettes    Start date: 03/01/1969    Quit date: 03/01/2009    Years since quitting: 14.6   Smokeless tobacco: Never  Vaping Use   Vaping status: Never Used  Substance Use Topics   Alcohol use: No   Drug use: No    Ms.Haver reports that she quit smoking about 14 years ago. Her smoking use included cigarettes. She started smoking about 54 years ago. She has a 30 pack-year smoking history. She has never used smokeless tobacco. She reports that she does not drink alcohol and does not use drugs.  Tobacco Cessation: Former smoker with a 30-pack-year smoking history. Quit in 2011  Past surgical hx, Family hx, Social hx  all reviewed.  Current Outpatient Medications on File Prior to Visit  Medication Sig   Cholecalciferol (VITAMIN D3) 50 MCG (2000 UT) capsule Take 2,000 Units by mouth daily.    Coenzyme Q10 (CO Q 10 PO)    Flaxseed, Linseed, (FLAX SEED OIL PO) Take 1 tablet by mouth daily.   Garlic 10 MG CAPS Take 10 mg by mouth daily.   Krill Oil 500 MG CAPS    levothyroxine  (SYNTHROID ) 88 MCG tablet Take 1 tablet (88 mcg total) by mouth daily before breakfast.   lisinopril  (ZESTRIL ) 10 MG tablet Take 10 mg by mouth daily.   Methylcobalamin (B12) 5000 MCG SUBL    Omega-3 Fatty Acids (FISH OIL CONCENTRATE) 1000 MG CAPS Take 1,000 mg by mouth daily.   Red Yeast Rice 600 MG TABS    No current facility-administered medications on file prior to visit.     Allergies  Allergen Reactions   Hydrocodone-Acetaminophen  Itching    Other Reaction(s): Unknown   Morphine     Other Reaction(s): Other, Unknown  hallucinations, hallucinations, hallucinations    Other Reaction(s): Unknown    hallucinations hallucinations hallucinations  hallucinations, hallucinations, hallucinations  Other Reaction(s): Unknown  hallucinations hallucinations hallucinations    hallucinations hallucinations hallucinations   Morphine And Codeine  Other (See Comments)    hallucinations hallucinations   Oxycodone -Acetaminophen  Itching    Other Reaction(s): Other, Unknown  tachycardia    Other Reaction(s): Unknown  tachycardia  Other Reaction(s): Unknown    tachycardia   Ezetimibe  Other (See Comments)    myalgias  Other Reaction(s): Other   Hydromorphone     hallucentations  Other Reaction(s): Hallucinations, Unknown  Other Reaction(s): Unknown    hallucentations  Other Reaction(s): Unknown  hallucentations    hallucentations   Statins Other (See Comments)    myalgis  Other Reaction(s): Unknown  Other Reaction(s): Other  myalgis, myalgis    Other Reaction(s): Other (See Comments)    myalgis    myalgis  myalgis  myalgis, myalgis  Other Reaction(s): Other (See Comments)  myalgis  myalgis myalgis    myalgis myalgis   Percocet [Oxycodone -Acetaminophen ] Itching and Other (See Comments)    tachycardia   Vicodin [Hydrocodone-Acetaminophen ]     Review Of Systems:  Constitutional:   No  weight loss, night sweats,  Fevers, chills, fatigue, or  lassitude.  HEENT:   No headaches,  Difficulty swallowing,  Tooth/dental problems, or  Sore throat,                No sneezing, itching, ear ache, nasal congestion, post nasal drip,   CV:  No chest pain,  Orthopnea, PND, swelling in lower extremities, anasarca, dizziness, palpitations, syncope.   GI  No heartburn, indigestion, abdominal pain, nausea, vomiting, diarrhea, change in bowel habits, loss of appetite, bloody stools.   Resp: No shortness of breath with exertion or at rest.  No excess mucus, no productive cough,  No non-productive cough,  No coughing up of blood.  No change in color of mucus.  No wheezing.  No chest wall deformity  Skin: no rash or lesions.  GU: no dysuria, change in color of urine, no urgency or frequency.  No flank pain, no hematuria   MS:  No joint pain or swelling.  No decreased range of motion.  No back pain.  Psych:  No change in mood or affect. No depression or anxiety.  No memory loss.   Vital Signs BP (!) 142/82   Pulse 86   Temp 98.1 F (36.7 C) (Oral)   Ht 5' 6 (1.676 m)   Wt 192 lb 6.4 oz (87.3 kg)   SpO2 97%   BMI 31.05 kg/m    Physical Exam:  General- No distress,  A&Ox3, pleasant ENT: No sinus tenderness, TM clear, pale nasal mucosa, no oral exudate,no post nasal drip, no LAN Cardiac: S1, S2, regular rate and rhythm, no murmur Chest: No wheeze/ rales/ dullness; no accessory muscle use, no nasal flaring, no sternal retractions Abd.: Soft Non-tender, nondistended, bowel sounds positive,Body mass index is 31.05 kg/m.  Ext: No clubbing cyanosis, edema, no obvious deformities Neuro:  normal  strength, MAE x 4, A&O x 3 Skin: No rashes, warm and dry, no obvious skin lesions  Psych: normal mood and behavior    Assessment & Plan Former smoker  Abnormal lung cancer screening with a 4.9 mm nodule along the right pleyral fissure, and a 13.8 mm nodule nodule, mixed attenuation, in the dependent right lower lobe. 13.8 mm right lower lobe nodule is PET avid with an SUV of 2.7. Pt. Is concerned with seeding with biopsy, and would like to think before making a decision. Plan We have discussed the results of your PET scan. There is some low level metabolic activity on the PET scan that is concerning for lung cancer. I have recommended a biopsy for definitive diagnosis, or referral to surgery to be evaluated . You prefer to take some time to make this decision , which is fine. Please do not wait too long.  There is always the chance this could grow.  Follow up with me in 2 weeks to see if you have made a decision. If you make a decision earlier, please call the office and we can do a video visit to make any referrals or schedule any procedures.  I have ordered PFT's to determine if you are a candidate for surgery. You will get a call to get this scheduled. Call if you need us  sooner . Please contact office for sooner follow up if symptoms do not improve or worsen or seek emergency care      I spent 30 minutes dedicated to the care of this patient on the date of this encounter to include pre-visit review of records, face-to-face time with the patient discussing conditions above,  post visit ordering of testing, clinical documentation with the electronic health record, making appropriate referrals as documented, and communicating necessary information to the patient's healthcare team.    Lauraine JULIANNA Lites, NP 10/17/2023  8:23 AM

## 2023-10-19 ENCOUNTER — Ambulatory Visit (INDEPENDENT_AMBULATORY_CARE_PROVIDER_SITE_OTHER): Admitting: Pulmonary Disease

## 2023-10-19 ENCOUNTER — Telehealth: Payer: Self-pay | Admitting: *Deleted

## 2023-10-19 DIAGNOSIS — R911 Solitary pulmonary nodule: Secondary | ICD-10-CM

## 2023-10-19 LAB — PULMONARY FUNCTION TEST
DL/VA % pred: 108 %
DL/VA: 4.43 ml/min/mmHg/L
DLCO unc % pred: 95 %
DLCO unc: 19.92 ml/min/mmHg
FEF 25-75 Post: 3.29 L/s
FEF 25-75 Pre: 2.87 L/s
FEF2575-%Change-Post: 14 %
FEF2575-%Pred-Post: 163 %
FEF2575-%Pred-Pre: 142 %
FEV1-%Change-Post: 5 %
FEV1-%Pred-Post: 99 %
FEV1-%Pred-Pre: 94 %
FEV1-Post: 2.45 L
FEV1-Pre: 2.33 L
FEV1FVC-%Change-Post: 3 %
FEV1FVC-%Pred-Pre: 108 %
FEV6-%Change-Post: 3 %
FEV6-%Pred-Post: 92 %
FEV6-%Pred-Pre: 89 %
FEV6-Post: 2.87 L
FEV6-Pre: 2.78 L
FEV6FVC-%Change-Post: 1 %
FEV6FVC-%Pred-Post: 104 %
FEV6FVC-%Pred-Pre: 102 %
FVC-%Change-Post: 1 %
FVC-%Pred-Post: 88 %
FVC-%Pred-Pre: 87 %
FVC-Post: 2.87 L
FVC-Pre: 2.83 L
Post FEV1/FVC ratio: 85 %
Post FEV6/FVC ratio: 100 %
Pre FEV1/FVC ratio: 82 %
Pre FEV6/FVC Ratio: 98 %
RV % pred: 82 %
RV: 1.9 L
TLC % pred: 95 %
TLC: 5.11 L

## 2023-10-19 NOTE — Telephone Encounter (Addendum)
 Sarah, please see message.  Patient has a follow up visit with you on 11/11/2023 at 10:00 am.  We need to schedule the bronchoscopy.  If needed, you can call patient at [phone].

## 2023-10-19 NOTE — Progress Notes (Signed)
 Full pft performed today

## 2023-10-19 NOTE — Patient Instructions (Signed)
 Full pft performed today

## 2023-10-19 NOTE — Telephone Encounter (Addendum)
 Ruthell Lauraine FALCON, NP to Me  [facility] Triage Pool  [facility] Lung Nodule Pool      10/19/23  3:52 PM She is scheduled for a video visit with me tomorrow to set up her broch     Spoke with patient and she is aware of appointment.  NFN at this time.

## 2023-10-19 NOTE — Telephone Encounter (Signed)
 PT presented to the front for a PFT test today. She said she has decided to move fwd with the bronc and that Ms. Ruthell asked her to let her know and she would set up a video call. Please let Ms. Ruthell know. TY

## 2023-10-20 ENCOUNTER — Telehealth: Payer: Self-pay | Admitting: Acute Care

## 2023-10-20 ENCOUNTER — Encounter: Payer: Self-pay | Admitting: Emergency Medicine

## 2023-10-20 ENCOUNTER — Ambulatory Visit: Admitting: Acute Care

## 2023-10-20 ENCOUNTER — Encounter: Payer: Self-pay | Admitting: Acute Care

## 2023-10-20 DIAGNOSIS — R911 Solitary pulmonary nodule: Secondary | ICD-10-CM | POA: Diagnosis not present

## 2023-10-20 DIAGNOSIS — Z87891 Personal history of nicotine dependence: Secondary | ICD-10-CM | POA: Diagnosis not present

## 2023-10-20 DIAGNOSIS — R918 Other nonspecific abnormal finding of lung field: Secondary | ICD-10-CM | POA: Diagnosis not present

## 2023-10-20 DIAGNOSIS — Z809 Family history of malignant neoplasm, unspecified: Secondary | ICD-10-CM

## 2023-10-20 NOTE — Telephone Encounter (Signed)
 Letter is done Lauraine MATSU is going to mail it to the patient she is aware will send to [facility] to get serbia

## 2023-10-20 NOTE — Progress Notes (Signed)
 Virtual Visit via Video Note  I connected with Holly Riley on 10/20/23 at  9:00 AM EDT by a video enabled telemedicine application and verified that I am speaking with the correct person using two identifiers.  Location: Patient:  At home Provider: [address], [place], [place], [address]    I discussed the limitations of evaluation and management by telemedicine and the availability of in person appointments. The patient expressed understanding and agreed to proceed.  History of Present Illness: Holly Riley is a 68 y.o. 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 She was here for consult 09/2023 after abnormal chest imaging. PET scan was done as follow up to the abnormal LDCT Chest. 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 We recommended a robotic  assisted navigational bronchoscopy with biopsies . She has a strong belief that when cancer is biopsied, it seeds the rest of the body. I offered her an appointment with thoracic surgery to have the nodule removed without biopsy first, and explained that could result in surgery for what may not be a cancer, as we do not know diagnosis without tissue to evaluate. She wanted time to think about these options. I scheduled a 2 week follow up, as I didn't want her to get lost to follow up. She called the office 10/19/2023, and asked to get scheduled for a bronchoscopy with biopsies. She is here by video today to schedule the biopsy. We have reviewed the risks to include bleeding, infection, pneumothorax and adverse reaction to anesthesia. She has given informed consent to move forward with the procedure. She is not on any blood thinners, but does take black seed oil, which she is holding. She knows to be NPO after midnight.She has family to drive her to and from the procedure, and monitor her for 24 hours after.  She does  have a significant family history of Cardiolipen enzyme . Her sister is positive for Factor V. Patient herself was tested about 20 years ago, but was negative. Her sister, maternal uncle, maternal grandfather all had PE's. Uncle and grandfather did not survive.   She is scheduled for 11/01/2023 at 7 am for navigational bronchoscopy with biopsies per Dr. Shelah. She had no further questions at completion of th video visit.She will follow up with me 11/11/2023 to review results.  Observations/Objective: PET scan 10/07/2023 Low level metabolism within a persistent ground-glass right lower. 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 2. Left adrenal adenoma. 3. Aortic atherosclerosis (ICD10-I70.0). Coronary artery calcification.   LDCT 09/12/2023 Lungs/Pleura: No pleural fluid. Mild centrilobular emphysema. Nodule along the right major fissure measures 4.9 mm.   Dependent right lower lobe density, favored to be sub solid or mixed attenuation, measures 13.8 mm. This is characterized as solid on the DynaCAD software.   Upper Abdomen: Normal imaged portions of the liver, spleen, stomach, pancreas, gallbladder, right adrenal gland, kidneys. Left adrenal nodule of 1.8 cm measures -2 HU.   Musculoskeletal: Cervical spine fixation.   IMPRESSION: Lung-RADS 4A, suspicious. Follow up low-dose chest CT without contrast in 3 months (please use the following order, CT CHEST LCS NODULE FOLLOW-UP W/O CM) is recommended. Dependent right lower lobe 13.8 mm density which could represent atelectasis or a mixed attenuation nodule.   Aortic atherosclerosis (ICD10-I70.0) and emphysema (ICD10-J43.9).   Left adrenal adenoma. In the absence of clinically indicated signs/symptoms require(s) no  independent follow-up.    Assessment and Plan: 13.8 mm Right lower lobe pulmonary lung nodule on LDCT Low level PET avidity concerning for indolent stage IA adenocarcinoma. 4.9 mm nodule along  right major fissure Former smoker  Family history of cancer in mother, father,sister Plan I am glad you called back to move forward with biopsy. We have reviewed your scans. I have placed an order for a bronchoscopy with biopsies.  We have discussed the procedure in detail.  We have reviewed the risks and benefits of the procedure. These include bleeding, infection, puncture of the lung, and adverse reaction to anesthesia. You have agreed to proceed with biopsy to evaluate the right lower lobe lung nodule. Your procedure will be done by Dr. Lamar Chris. You will receive a letter today with date time and information pertaining to the procedure. You will need someone to drive you to the procedure, stay with you during the procedure, and stay with you after the procedure. You will also need someone to stay with you for 24 hours after anesthesia to ensure you have cleared and are doing well. You will follow-up with me 1 week after the procedure to review the results and to ensure you are doing well. Call if you need us  prior to the procedure or if you have any questions at all. Please contact office for sooner follow up if symptoms do not improve or worsen or seek emergency care      Follow Up Instructions: See Below for procedure letter, and follow up on 11/11/2023    [facility] [address] [place] [address]   Dear Holly Riley,   The following has been scheduled for you:   Procedure: Bronchoscopy                 Surgeon:  Dr Chris     Procedure date:  11/01/23 at 7:30 am  Arrival Time:  5:30 am Location: [facility] [address], Entrance [address] [place], [place] [address]   Additional Information: Please stop the following medications:  hold black seed oil  Do not eat anything after midnight - ok to take medications on the day of procedure with only sips of water You will need someone to drive you home after this procedure & to be with you for  the next 24 hours You may receive a call from the hospital a day or two prior to procedure to go over additional instructions & medications  NEXT VISIT:  11/11/23 at 10:00 am With Lauraine Lites, NP at    [facility]    [address] [place], [place]  [address]        I discussed the assessment and treatment plan with the patient. The patient was provided an opportunity to ask questions and all were answered. The patient agreed with the plan and demonstrated an understanding of the instructions.   The patient was advised to call back or seek an in-person evaluation if the symptoms worsen or if the condition fails to improve as anticipated.  I provided 25 minutes of video -face-to-face time during this encounter.   Lauraine JULIANNA Lites, NP

## 2023-10-20 NOTE — H&P (View-Only) (Signed)
 Virtual Visit via Video Note  I connected with [name] on 10/20/23 at  9:00 AM EDT by a video enabled telemedicine application and verified that I am speaking with the correct person using two identifiers.  Location: Patient:  At home Provider: [address], [place], [place], [address]    I discussed the limitations of evaluation and management by telemedicine and the availability of in person appointments. The patient expressed understanding and agreed to proceed.  History of Present Illness: Holly Riley is a 68 y.o. 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 She was here for consult 09/2023 after abnormal chest imaging. PET scan was done as follow up to the abnormal LDCT Chest. 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 We recommended a robotic  assisted navigational bronchoscopy with biopsies . She has a strong belief that when cancer is biopsied, it seeds the rest of the body. I offered her an appointment with thoracic surgery to have the nodule removed without biopsy first, and explained that could result in surgery for what may not be a cancer, as we do not know diagnosis without tissue to evaluate. She wanted time to think about these options. I scheduled a 2 week follow up, as I didn't want her to get lost to follow up. She called the office 10/19/2023, and asked to get scheduled for a bronchoscopy with biopsies. She is here by video today to schedule the biopsy. We have reviewed the risks to include bleeding, infection, pneumothorax and adverse reaction to anesthesia. She has given informed consent to move forward with the procedure. She is not on any blood thinners, but does take black seed oil, which she is holding. She knows to be NPO after midnight.She has family to drive her to and from the procedure, and monitor her for 24 hours after.  She does  have a significant family history of Cardiolipen enzyme . Her sister is positive for Factor V. Patient herself was tested about 20 years ago, but was negative. Her sister, maternal uncle, maternal grandfather all had PE's. Uncle and grandfather did not survive.   She is scheduled for 11/01/2023 at 7 am for navigational bronchoscopy with biopsies per Dr. Shelah. She had no further questions at completion of th video visit.She will follow up with me 11/11/2023 to review results.  Observations/Objective: PET scan 10/07/2023 Low level metabolism within a persistent ground-glass right lower. 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 2. Left adrenal adenoma. 3. Aortic atherosclerosis (ICD10-I70.0). Coronary artery calcification.   LDCT 09/12/2023 Lungs/Pleura: No pleural fluid. Mild centrilobular emphysema. Nodule along the right major fissure measures 4.9 mm.   Dependent right lower lobe density, favored to be sub solid or mixed attenuation, measures 13.8 mm. This is characterized as solid on the DynaCAD software.   Upper Abdomen: Normal imaged portions of the liver, spleen, stomach, pancreas, gallbladder, right adrenal gland, kidneys. Left adrenal nodule of 1.8 cm measures -2 HU.   Musculoskeletal: Cervical spine fixation.   IMPRESSION: Lung-RADS 4A, suspicious. Follow up low-dose chest CT without contrast in 3 months (please use the following order, CT CHEST LCS NODULE FOLLOW-UP W/O CM) is recommended. Dependent right lower lobe 13.8 mm density which could represent atelectasis or a mixed attenuation nodule.   Aortic atherosclerosis (ICD10-I70.0) and emphysema (ICD10-J43.9).   Left adrenal adenoma. In the absence of clinically indicated signs/symptoms require(s) no  independent follow-up.    Assessment and Plan: 13.8 mm Right lower lobe pulmonary lung nodule on LDCT Low level PET avidity concerning for indolent stage IA adenocarcinoma. 4.9 mm nodule along  right major fissure Former smoker  Family history of cancer in mother, father,sister Plan I am glad you called back to move forward with biopsy. We have reviewed your scans. I have placed an order for a bronchoscopy with biopsies.  We have discussed the procedure in detail.  We have reviewed the risks and benefits of the procedure. These include bleeding, infection, puncture of the lung, and adverse reaction to anesthesia. You have agreed to proceed with biopsy to evaluate the right lower lobe lung nodule. Your procedure will be done by Dr. Lamar Chris. You will receive a letter today with date time and information pertaining to the procedure. You will need someone to drive you to the procedure, stay with you during the procedure, and stay with you after the procedure. You will also need someone to stay with you for 24 hours after anesthesia to ensure you have cleared and are doing well. You will follow-up with me 1 week after the procedure to review the results and to ensure you are doing well. Call if you need us  prior to the procedure or if you have any questions at all. Please contact office for sooner follow up if symptoms do not improve or worsen or seek emergency care      Follow Up Instructions: See Below for procedure letter, and follow up on 11/11/2023    [facility] [address] [place] [address]   Dear Ms. Lambertson,   The following has been scheduled for you:   Procedure: Bronchoscopy                 Surgeon:  Dr Chris     Procedure date:  11/01/23 at 7:30 am  Arrival Time:  5:30 am Location: [facility] [address], Entr[address] [place], [place] [address]   Additional Information: Please stop the following medications:  hold black seed oil  Do not eat anything after midnight - ok to take medications on the day of procedure with only sips of water You will need someone to drive you home after this procedure & to be with you for  the next 24 hours You may receive a call from the hospital a day or two prior to procedure to go over additional instructions & medications  NEXT VISIT:  11/11/23 at 10:00 am With Lauraine Lites, NP at    [facility]    [address] [place], [place]  [address]        I discussed the assessment and treatment plan with the patient. The patient was provided an opportunity to ask questions and all were answered. The patient agreed with the plan and demonstrated an understanding of the instructions.   The patient was advised to call back or seek an in-person evaluation if the symptoms worsen or if the condition fails to improve as anticipated.  I provided 25 minutes of video -face-to-face time during this encounter.   Lauraine JULIANNA Lites, NP

## 2023-10-20 NOTE — Telephone Encounter (Signed)
 Please schedule the following:  Provider performing procedure: Dr. Shelah Diagnosis:  lung nodule Which side if for nodule / mass?  Right Procedure: Robotic Navigational bronchoscopy with biopsies  Has patient been spoken to by Provider and given informed consent?  Yes, Rustyn Conery , NP Anesthesia:  General  Do you need Fluro? Yes Duration of procedure: 1.5 hours Date: 10/25/2023 Alternate Date: the next week, but the there is a 7 am slot on 8/26  Time: 7 am  Location: [facility] Does patient have OSA? No DM? Pre diabetic , no meds Or Latex allergy? No Medication Restriction/ Anticoagulate/Antiplatelet:  None , she will hold her black seed oil Pre-op Labs Ordered:determined by Anesthesia Imaging request:  Not at present time  (If, SuperDimension CT Chest, please have STAT courier sent to ENDO)

## 2023-10-20 NOTE — Patient Instructions (Signed)
 It is good to see you today. I am glad you called back to move forward with biopsy. We have reviewed your scans. I have placed an order for a bronchoscopy with biopsies.  We have discussed the procedure in detail.  We have reviewed the risks and benefits of the procedure. These include bleeding, infection, puncture of the lung, and adverse reaction to anesthesia. You have agreed to proceed with biopsy to evaluate the right lower lobe lung nodule. Your procedure will be done by Dr. Lamar Chris. You will receive a letter today with date time and information pertaining to the procedure. You will need someone to drive you to the procedure, stay with you during the procedure, and stay with you after the procedure. You will also need someone to stay with you for 24 hours after anesthesia to ensure you have cleared and are doing well. You will follow-up with me 1 week after the procedure to review the results and to ensure you are doing well. Call if you need us  prior to the procedure or if you have any questions at all. Please contact office for sooner follow up if symptoms do not improve or worsen or seek emergency care

## 2023-10-21 NOTE — Progress Notes (Signed)
 I agree with the plans as outlined above.   Lamar Chris, MD, PhD 10/21/2023, 6:30 PM  Pulmonary and Critical Care [phone] or if no answer before 7:00PM call [phone] For any issues after 7:00PM please call eLink [phone]

## 2023-10-28 ENCOUNTER — Other Ambulatory Visit: Payer: Self-pay

## 2023-10-28 ENCOUNTER — Encounter (HOSPITAL_COMMUNITY): Payer: Self-pay | Admitting: Emergency Medicine

## 2023-10-28 NOTE — Progress Notes (Signed)
 PCP - Lavern LITTIE Heck, MD  Chest x-ray - DOS EKG - DOS  Checks Blood Sugar: Does not check. Diet controlled  Anesthesia review: Y  Patient verbally denies any shortness of breath, fever, cough and chest pain during phone call   -------------  SDW INSTRUCTIONS given:  Your procedure is scheduled on Tuesday, Sept 2nd.  Report to [facility] Main Entrance A at 0530 A.M., and check in at the Admitting office.  Call this number if you have problems the morning of surgery:  [phone]   Remember:  Do not eat or drink after midnight the night before your surgery    Take these medicines the morning of surgery with A SIP OF WATER  levothyroxine  (SYNTHROID )   As of today, STOP taking any Aspirin (unless otherwise instructed by your surgeon) Aleve, Naproxen, Ibuprofen, Motrin, Advil, Goody's, BC's, all herbal medications, fish oil, and all vitamins.                      Do not wear jewelry, make up, or nail polish            Do not wear lotions, powders, perfumes/colognes, or deodorant.            Do not shave 48 hours prior to surgery.  Men may shave face and neck.            Do not bring valuables to the hospital.            [facility] is not responsible for any belongings or valuables.  Do NOT Smoke (Tobacco/Vaping) 24 hours prior to your procedure If you use a CPAP at night, you may bring all equipment for your overnight stay.   Contacts, glasses, dentures or bridgework may not be worn into surgery.      For patients admitted to the hospital, discharge time will be determined by your treatment team.   Patients discharged the day of surgery will not be allowed to drive home, and someone needs to stay with them for 24 hours.    Special instructions:   [facility]- Preparing For Surgery  Before surgery, you can play an important role. Because skin is not sterile, your skin needs to be as free of germs as possible. You can reduce the number of germs on your skin by washing  with CHG (chlorahexidine gluconate) Soap before surgery.  CHG is an antiseptic cleaner which kills germs and bonds with the skin to continue killing germs even after washing.    Oral Hygiene is also important to reduce your risk of infection.  Remember - BRUSH YOUR TEETH THE MORNING OF SURGERY WITH YOUR REGULAR TOOTHPASTE  Please do not use if you have an allergy to CHG or antibacterial soaps. If your skin becomes reddened/irritated stop using the CHG.  Do not shave (including legs and underarms) for at least 48 hours prior to first CHG shower. It is OK to shave your face.  Please follow these instructions carefully.   Shower the NIGHT BEFORE SURGERY and the MORNING OF SURGERY with DIAL Soap.   Pat yourself dry with a CLEAN TOWEL.  Wear CLEAN PAJAMAS to bed the night before surgery  Place CLEAN SHEETS on your bed the night of your first shower and DO NOT SLEEP WITH PETS.   Day of Surgery: Please shower morning of surgery  Wear Clean/Comfortable clothing the morning of surgery Do not apply any deodorants/lotions.   Remember to brush your teeth WITH YOUR  REGULAR TOOTHPASTE.   Questions were answered. Patient verbalized understanding of instructions.

## 2023-10-29 ENCOUNTER — Encounter (HOSPITAL_BASED_OUTPATIENT_CLINIC_OR_DEPARTMENT_OTHER): Payer: Self-pay

## 2023-10-29 ENCOUNTER — Ambulatory Visit (HOSPITAL_BASED_OUTPATIENT_CLINIC_OR_DEPARTMENT_OTHER)
Admission: RE | Admit: 2023-10-29 | Discharge: 2023-10-29 | Disposition: A | Discharge status: Home / Self Care | Attending: Physician Assistant | Admitting: Physician Assistant

## 2023-10-29 VITALS — BP 145/84 | HR 93 | Temp 98.0°F | Resp 18

## 2023-10-29 DIAGNOSIS — N3001 Acute cystitis with hematuria: Secondary | ICD-10-CM | POA: Insufficient documentation

## 2023-10-29 LAB — POCT URINE DIPSTICK
Bilirubin, UA: NEGATIVE
Glucose, UA: NEGATIVE mg/dL
Ketones, POC UA: NEGATIVE mg/dL
Nitrite, UA: NEGATIVE
Protein Ur, POC: NEGATIVE mg/dL
Spec Grav, UA: 1.015 (ref 1.010–1.025)
Urobilinogen, UA: 0.2 U/dL
pH, UA: 6 (ref 5.0–8.0)

## 2023-10-29 MED ORDER — CEPHALEXIN 500 MG PO CAPS: 500.0000 mg | ORAL_CAPSULE | Freq: Four times a day (QID) | ORAL | 0 refills | Status: DC

## 2023-10-29 NOTE — ED Provider Notes (Signed)
 [facility]    CSN: [id number] Arrival date & time: 10/29/23  1018      History   Chief Complaint Chief Complaint  Patient presents with   Urinary Frequency    With burning since 0130.  Some blood noted. - Entered by patient    HPI Holly Riley is a 68 y.o. female.   Patient presents today with a several hour history of UTI symptoms including dysuria, hematuria, urgency.  She denies any fever, nausea, vomiting.  She believes that her symptoms were triggered by the long bike ride (27 miles) that she took a few days ago.  She denies any recent antibiotics.  She does have a history of diabetes but does not take an SGLT2 inhibitor.  Denies any recent urogenital procedure or catheterization.  She is anxious to feel better she is scheduled for a bronchoscopy on Tuesday and does not want the infection or antibiotic use to impact this is a scheduled procedure.    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   Family history of colon cancer 12/26/2022   Gastroesophageal reflux disease 12/26/2022   Obesity 12/26/2022   Osteoporosis screening 11/16/2018   Statin intolerance 03/20/2017   Vitamin D  deficiency 03/20/2017   Controlled type 2 diabetes mellitus without complication, without long-term current use of insulin  (HCC) 02/17/2017   Combined hyperlipidemia associated with type 2 diabetes mellitus (HCC) 02/17/2017   Spinal stenosis    Insomnia 09/11/2013   Hypothyroidism (acquired) 03/26/2013   Obesity (BMI  30.0-34.9) 01/22/2013   Osteoarthritis 04/28/2011   Allergic rhinitis 11/25/2009    Past Surgical History:  Procedure Laterality Date   ABDOMINAL HYSTERECTOMY     CARPAL TUNNEL RELEASE Right    CATARACT EXTRACTION Bilateral    03/2023   CERVICAL FUSION     [place]   KNEE SURGERY Left    LUMBAR LAMINECTOMY     x 2, L4, L5, [place]   SPINE SURGERY     TONSILLECTOMY AND ADENOIDECTOMY     TRIGGER FINGER RELEASE Right 09/27/2022   Procedure: RELEASE TRIGGER FINGER/A-1 PULLEY RIGHT THUMB;  Surgeon: Murrell Drivers, MD;  Location: [facility];  Service: Orthopedics;  Laterality: Right;  30 MIN    OB History   No obstetric history on file.      Home Medications    Prior to Admission medications   Medication Sig Start Date End Date Taking? Authorizing Provider  cephALEXin  (KEFLEX ) 500 MG capsule Take 1 capsule (500 mg total) by mouth 4 (four) times daily. 10/29/23  Yes Rooney Gladwin, Rocky POUR, PA-C  Cholecalciferol (VITAMIN D3) 50 MCG (2000 UT) capsule Take 2,000 Units by mouth daily.    Yes [provider]  levothyroxine  (SYNTHROID ) 88 MCG tablet Take 1 tablet (88 mcg total) by mouth daily before breakfast. 07/14/23  Yes Jodie Lavern CROME, MD  lisinopril  (ZESTRIL ) 10 MG tablet Take 10 mg by mouth daily. 08/17/23  Yes [provider]  Coenzyme Q10 (CO Q 10 PO)  08/26/19   [provider]  Flaxseed, Linseed, (FLAX SEED OIL PO) Take 1 tablet by mouth daily.    [provider]  Garlic 10 MG CAPS Take 10 mg by mouth daily.    [provider]  Anselm Frizzle 500 MG CAPS  02/26/22   [provider]  loratadine (CLARITIN) 10 MG tablet Take 10 mg by mouth daily.    [provider]  Methylcobalamin (B12) 5000 MCG SUBL  09/26/23   [provider]  Omega-3 Fatty Acids (FISH OIL CONCENTRATE) 1000 MG CAPS Take 1,000 mg by mouth daily.    [provider]  Red Yeast Rice 600 MG TABS  05/09/20   [provider]     Family History Family History  Problem Relation Age of Onset   Cancer Mother    Hyperlipidemia Mother    Cancer Father    Diabetes Father    Heart disease Father    Hyperlipidemia Father    Hypertension Father    Cancer Sister    Diabetes Sister    Hyperlipidemia Sister    Breast cancer Sister    Hyperlipidemia Sister    Diabetes Brother     Social History Social History   Tobacco Use   Smoking status: Former    Current packs/day: 0.00    Average packs/day: 0.8 packs/day for 40.0 years (30.0 ttl pk-yrs)    Types: Cigarettes    Start date: 03/01/1969    Quit date: 03/01/2009    Years since quitting: 14.6    Passive exposure: Past   Smokeless tobacco: Never  Vaping Use   Vaping status: Never Used  Substance Use Topics   Alcohol use: No   Drug use: No     Allergies   Hydrocodone-acetaminophen , Morphine, Morphine and codeine , Oxycodone -acetaminophen , Ezetimibe , Hydromorphone, Statins, Flonase  [fluticasone ], Percocet [oxycodone -acetaminophen ], Singulair  [montelukast ], and Vicodin [hydrocodone-acetaminophen ]   Review of Systems Review of Systems  Constitutional:  Negative for activity change, appetite change, fatigue and fever.  Gastrointestinal:  Negative for abdominal pain, diarrhea, nausea and vomiting.  Genitourinary:  Positive for dysuria, hematuria and urgency. Negative for flank pain and frequency.  Musculoskeletal:  Negative for arthralgias and myalgias.     Physical Exam Triage Vital Signs ED Triage Vitals  Encounter Vitals Group     BP 10/29/23 1028 (!) 145/84     Girls Systolic BP Percentile --      Girls Diastolic BP Percentile --      Boys Systolic BP Percentile --      Boys Diastolic BP Percentile --      Pulse Rate 10/29/23 1028 93     Resp 10/29/23 1028 18     Temp 10/29/23 1028 98 F (36.7 C)     Temp Source 10/29/23 1028 Oral     SpO2 10/29/23 1028 97 %     Weight --      Height --      Head Circumference --      Peak Flow --       Pain Score 10/29/23 1026 0     Pain Loc --      Pain Education --      Exclude from Growth Chart --    No data found.  Updated Vital Signs BP (!) 145/84 (BP Location: Right Arm)   Pulse 93   Temp 98 F (36.7 C) (Oral)   Resp 18   SpO2 97%  Visual Acuity Right Eye Distance:   Left Eye Distance:   Bilateral Distance:    Right Eye Near:   Left Eye Near:    Bilateral Near:     Physical Exam Vitals reviewed.  Constitutional:      General: She is awake. She is not in acute distress.    Appearance: Normal appearance. She is well-developed. She is not ill-appearing.     Comments: Very pleasant female appears stated age no acute distress sitting comfortably in exam room  HENT:     Head: Normocephalic and atraumatic.  Cardiovascular:     Rate and Rhythm: Normal rate and regular rhythm.     Heart sounds: Normal heart sounds, S1 normal and S2 normal. No murmur heard. Pulmonary:     Effort: Pulmonary effort is normal.     Breath sounds: Normal breath sounds. No wheezing, rhonchi or rales.     Comments: Clear to auscultation bilaterally Abdominal:     Tenderness: There is no abdominal tenderness. There is no right CVA tenderness, left CVA tenderness, guarding or rebound.  Psychiatric:        Behavior: Behavior is cooperative.      UC Treatments / Results  Labs (all labs ordered are listed, but only abnormal results are displayed) Labs Reviewed  POCT URINE DIPSTICK - Abnormal; Notable for the following components:      Result Value   Blood, UA large (*)    Leukocytes, UA Large (3+) (*)    All other components within normal limits  URINE CULTURE    EKG   Radiology No results found.  Procedures Procedures (including critical care time)  Medications Ordered in UC Medications - No data to display  Initial Impression / Assessment and Plan / UC Course  I have reviewed the triage vital signs and the nursing notes.  Pertinent labs & imaging results that were  available during my care of the patient were reviewed by me and considered in my medical decision making (see chart for details).     Patient is well-appearing, afebrile, nontoxic, nontachycardic.  UA consistent with UTI.  Will start cephalexin  4 times daily for 5 days.  No indication for dose adjustment based on metabolic panel from 11/22/2022 with a creatinine of 0.82 and catheter creatinine clearance of 88 mL/min.  Will send her urine for culture and contact her if we need to adjust her medication based on bacterial susceptibilities noted on culture.  She was encouraged to push fluids.  Recommend she follow-up with her primary care to ensure that the blood noted today resolves with clearing of infection.  I did encourage her to contact her surgeon or preoperative services to determine if we need to adjust her medication or time of the procedure based having a urinary tract infection and requiring antibiotics.  We discussed that if anything worsens such as fever, hematuria, nausea/vomiting, weakness she needs to be seen immediately.  Strict return precautions given.  All questions answered patient satisfaction.  Final Clinical Impressions(s) / UC Diagnoses   Final diagnoses:  Acute cystitis with hematuria     Discharge Instructions      We are treating you for urinary tract infection.  Take cephalexin  4 times daily for 5 days.  Make sure you rest and drink plenty of fluid.  Contact your surgeons office to see if your procedure needs to be adjusted based upon UTI.  I will contact you if need to stop or change your antibiotics based on your culture results.  There was some blood noted in your urinalysis and so I recommend that you follow-up with your primary care in 2 to 4 weeks to have this repeated and ensure that it goes away once we have treated the infection. If anything worsens and you have abdominal pain, blood in your urine, nausea, vomiting, fever, weakness, persistent or worsening symptoms  you should be seen immediately. .     ED Prescriptions     Medication Sig Dispense Auth. Provider   cephALEXin  (KEFLEX ) 500 MG capsule Take 1 capsule (500 mg total) by mouth 4 (four) times daily. 20 capsule Oseph Imburgia K, PA-C      PDMP not reviewed this encounter.   Sherrell Rocky POUR, PA-C 10/29/23 1107

## 2023-10-29 NOTE — Discharge Instructions (Signed)
 We are treating you for urinary tract infection.  Take cephalexin  4 times daily for 5 days.  Make sure you rest and drink plenty of fluid.  Contact your surgeons office to see if your procedure needs to be adjusted based upon UTI.  I will contact you if need to stop or change your antibiotics based on your culture results. There was some blood noted in your urinalysis and so I recommend that you follow-up with your primary care in 2 to 4 weeks to have this repeated and ensure that it goes away once we have treated the infection. If anything worsens and you have abdominal pain, blood in your urine, nausea, vomiting, fever, weakness, persistent or worsening symptoms you should be seen immediately. SABRA

## 2023-10-29 NOTE — ED Triage Notes (Signed)
 Pt c/o urinary frequency and burning started this morning she recently bicycled on Thursday, she does report this morning she had blood on the tissue,  she has a bronchoscopy scheduled on Thursday.

## 2023-10-31 LAB — URINE CULTURE: Culture: 70000 — AB

## 2023-11-01 ENCOUNTER — Ambulatory Visit (HOSPITAL_COMMUNITY)

## 2023-11-01 ENCOUNTER — Other Ambulatory Visit: Payer: Self-pay

## 2023-11-01 ENCOUNTER — Ambulatory Visit (HOSPITAL_COMMUNITY): Payer: Self-pay

## 2023-11-01 ENCOUNTER — Ambulatory Visit (HOSPITAL_COMMUNITY): Payer: Self-pay | Admitting: Physician Assistant

## 2023-11-01 ENCOUNTER — Ambulatory Visit (HOSPITAL_COMMUNITY)
Admission: RE | Admit: 2023-11-01 | Discharge: 2023-11-01 | Disposition: A | Discharge status: Home / Self Care | Attending: Emergency Medicine | Admitting: Emergency Medicine

## 2023-11-01 ENCOUNTER — Encounter (HOSPITAL_COMMUNITY): Payer: Self-pay | Admitting: Emergency Medicine

## 2023-11-01 ENCOUNTER — Encounter (HOSPITAL_COMMUNITY)
Admission: RE | Disposition: A | Discharge status: Home / Self Care | Payer: Self-pay | Source: Home / Self Care | Attending: Emergency Medicine

## 2023-11-01 DIAGNOSIS — E119 Type 2 diabetes mellitus without complications: Secondary | ICD-10-CM | POA: Insufficient documentation

## 2023-11-01 DIAGNOSIS — I10 Essential (primary) hypertension: Secondary | ICD-10-CM | POA: Diagnosis not present

## 2023-11-01 DIAGNOSIS — Z79899 Other long term (current) drug therapy: Secondary | ICD-10-CM | POA: Insufficient documentation

## 2023-11-01 DIAGNOSIS — R911 Solitary pulmonary nodule: Secondary | ICD-10-CM

## 2023-11-01 DIAGNOSIS — Z87891 Personal history of nicotine dependence: Secondary | ICD-10-CM

## 2023-11-01 DIAGNOSIS — E039 Hypothyroidism, unspecified: Secondary | ICD-10-CM

## 2023-11-01 DIAGNOSIS — Z48813 Encounter for surgical aftercare following surgery on the respiratory system: Secondary | ICD-10-CM | POA: Diagnosis not present

## 2023-11-01 DIAGNOSIS — Z8489 Family history of other specified conditions: Secondary | ICD-10-CM | POA: Diagnosis not present

## 2023-11-01 DIAGNOSIS — J984 Other disorders of lung: Secondary | ICD-10-CM | POA: Diagnosis not present

## 2023-11-01 HISTORY — PX: VIDEO BRONCHOSCOPY WITH ENDOBRONCHIAL NAVIGATION: SHX6175

## 2023-11-01 HISTORY — DX: Hypothyroidism, unspecified: E03.9

## 2023-11-01 LAB — CBC
HCT: 46.1 % — ABNORMAL HIGH (ref 36.0–46.0)
Hemoglobin: 15.2 g/dL — ABNORMAL HIGH (ref 12.0–15.0)
MCH: 32.2 pg (ref 26.0–34.0)
MCHC: 33 g/dL (ref 30.0–36.0)
MCV: 97.7 fL (ref 80.0–100.0)
Platelets: 259 K/uL (ref 150–400)
RBC: 4.72 MIL/uL (ref 3.87–5.11)
RDW: 12.2 % (ref 11.5–15.5)
WBC: 7.9 K/uL (ref 4.0–10.5)
nRBC: 0 % (ref 0.0–0.2)

## 2023-11-01 LAB — BASIC METABOLIC PANEL WITH GFR
Anion gap: 17 — ABNORMAL HIGH (ref 5–15)
BUN: 12 mg/dL (ref 8–23)
CO2: 22 mmol/L (ref 22–32)
Calcium: 9.2 mg/dL (ref 8.9–10.3)
Chloride: 104 mmol/L (ref 98–111)
Creatinine, Ser: 0.99 mg/dL (ref 0.44–1.00)
GFR, Estimated: >60 mL/min (ref >60)
Glucose, Bld: 167 mg/dL — ABNORMAL HIGH (ref 70–99)
Potassium: 4.2 mmol/L (ref 3.5–5.1)
Sodium: 143 mmol/L (ref 135–145)

## 2023-11-01 LAB — GLUCOSE, CAPILLARY
Glucose-Capillary: 175 mg/dL — ABNORMAL HIGH (ref 70–99)
Glucose-Capillary: 170 mg/dL — ABNORMAL HIGH (ref 70–99)

## 2023-11-01 SURGERY — VIDEO BRONCHOSCOPY WITH ENDOBRONCHIAL NAVIGATION
Anesthesia: General | Laterality: Right

## 2023-11-01 MED ORDER — FENTANYL CITRATE (PF) 100 MCG/2ML IJ SOLN: 25.0000 ug | INTRAMUSCULAR | Status: DC | PRN

## 2023-11-01 MED ORDER — INSULIN ASPART 100 UNIT/ML IJ SOLN: 0.0000 [IU] | INTRAMUSCULAR | Status: DC | PRN

## 2023-11-01 MED ORDER — ONDANSETRON HCL 4 MG/2ML IJ SOLN: 4.0000 mg | Freq: Once | INTRAMUSCULAR | Status: DC | PRN

## 2023-11-01 MED ORDER — DEXAMETHASONE SODIUM PHOSPHATE 10 MG/ML IJ SOLN
INTRAMUSCULAR | Status: DC | PRN
  Administered 2023-11-01: 8 mg via INTRAVENOUS

## 2023-11-01 MED ORDER — ACETAMINOPHEN 500 MG PO TABS
1000.0000 mg | ORAL_TABLET | Freq: Once | ORAL | Status: AC
  Administered 2023-11-01: 1000 mg via ORAL
  Filled 2023-11-01: qty 2

## 2023-11-01 MED ORDER — DEXMEDETOMIDINE HCL IN NACL 80 MCG/20ML IV SOLN
INTRAVENOUS | Status: DC | PRN
  Administered 2023-11-01: 12 ug via INTRAVENOUS

## 2023-11-01 MED ORDER — LACTATED RINGERS IV SOLN: INTRAVENOUS | Status: DC

## 2023-11-01 MED ORDER — ROCURONIUM BROMIDE 10 MG/ML (PF) SYRINGE
PREFILLED_SYRINGE | INTRAVENOUS | Status: DC | PRN
Start: 2023-11-01 — End: 2023-11-01
  Administered 2023-11-01: 50 mg via INTRAVENOUS
  Administered 2023-11-01 (×2): 10 mg via INTRAVENOUS

## 2023-11-01 MED ORDER — PROPOFOL 10 MG/ML IV BOLUS
INTRAVENOUS | Status: DC | PRN
  Administered 2023-11-01: 200 mg via INTRAVENOUS
  Administered 2023-11-01: 30 mg via INTRAVENOUS

## 2023-11-01 MED ORDER — PROPOFOL 500 MG/50ML IV EMUL
INTRAVENOUS | Status: DC | PRN
  Administered 2023-11-01: 125 ug/kg/min via INTRAVENOUS

## 2023-11-01 MED ORDER — SUGAMMADEX SODIUM 200 MG/2ML IV SOLN
INTRAVENOUS | Status: DC | PRN
  Administered 2023-11-01: 160 mg via INTRAVENOUS

## 2023-11-01 MED ORDER — LIDOCAINE 2% (20 MG/ML) 5 ML SYRINGE
INTRAMUSCULAR | Status: DC | PRN
  Administered 2023-11-01: 60 mg via INTRAVENOUS

## 2023-11-01 MED ORDER — ONDANSETRON HCL 4 MG/2ML IJ SOLN
INTRAMUSCULAR | Status: DC | PRN
  Administered 2023-11-01: 4 mg via INTRAVENOUS

## 2023-11-01 SURGICAL SUPPLY — 37 items
ADAPTER BRONCHOSCOPE OLYMPUS (ADAPTER) ×1 IMPLANT
ADAPTER VALVE BIOPSY EBUS (MISCELLANEOUS) IMPLANT
BAG COUNTER SPONGE SURGICOUNT (BAG) ×1 IMPLANT
BRUSH CYTOL CELLEBRITY 1.5X140 (MISCELLANEOUS) ×1 IMPLANT
BRUSH SUPERTRAX BIOPSY (INSTRUMENTS) IMPLANT
BRUSH SUPERTRAX NDL-TIP CYTO (INSTRUMENTS) ×1 IMPLANT
CANISTER SUCTION 3000ML PPV (SUCTIONS) ×1 IMPLANT
CNTNR URN SCR LID CUP LEK RST (MISCELLANEOUS) ×1 IMPLANT
COVER BACK TABLE 60X90IN (DRAPES) ×1 IMPLANT
FILTER STRAW FLUID ASPIR (MISCELLANEOUS) IMPLANT
FORCEPS BIOP 1.5 SINGLE USE (MISCELLANEOUS) ×1 IMPLANT
FORCEPS BIOP SUPERTRX PREMAR (INSTRUMENTS) ×1 IMPLANT
GAUZE SPONGE 4X4 12PLY STRL (GAUZE/BANDAGES/DRESSINGS) ×1 IMPLANT
GLOVE BIO SURGEON STRL SZ7.5 (GLOVE) ×2 IMPLANT
GOWN STRL REUS W/ TWL LRG LVL3 (GOWN DISPOSABLE) ×2 IMPLANT
KIT CLEAN ENDO COMPLIANCE (KITS) ×1 IMPLANT
KIT LOCATABLE GUIDE (CANNULA) IMPLANT
KIT MARKER FIDUCIAL DELIVERY (KITS) IMPLANT
KIT TURNOVER KIT B (KITS) ×1 IMPLANT
MARKER SKIN DUAL TIP RULER LAB (MISCELLANEOUS) ×1 IMPLANT
NDL SUPERTRX PREMARK BIOPSY (NEEDLE) ×1 IMPLANT
NEEDLE SUPERTRX PREMARK BIOPSY (NEEDLE) ×1 IMPLANT
NS IRRIG 1000ML POUR BTL (IV SOLUTION) ×1 IMPLANT
OIL SILICONE PENTAX (PARTS (SERVICE/REPAIRS)) ×1 IMPLANT
PAD ARMBOARD POSITIONER FOAM (MISCELLANEOUS) ×2 IMPLANT
PATCHES PATIENT (LABEL) ×3 IMPLANT
SYR 20ML ECCENTRIC (SYRINGE) ×1 IMPLANT
SYR 20ML LL LF (SYRINGE) ×1 IMPLANT
SYR 50ML SLIP (SYRINGE) ×1 IMPLANT
SuperLock Fiducial Marker IMPLANT
TOWEL GREEN STERILE FF (TOWEL DISPOSABLE) ×1 IMPLANT
TRAP SPECIMEN MUCUS 40CC (MISCELLANEOUS) IMPLANT
TUBE CONNECTING 20X1/4 (TUBING) ×1 IMPLANT
UNDERPAD 30X36 HEAVY ABSORB (UNDERPADS AND DIAPERS) ×1 IMPLANT
VALVE BIOPSY SINGLE USE (MISCELLANEOUS) ×1 IMPLANT
VALVE SUCTION BRONCHIO DISP (MISCELLANEOUS) ×1 IMPLANT
WATER STERILE IRR 1000ML POUR (IV SOLUTION) ×1 IMPLANT

## 2023-11-01 NOTE — Op Note (Signed)
 Video Bronchoscopy with Robotic Assisted Bronchoscopic Navigation   Date of Operation: 11/01/2023   Pre-op Diagnosis: Right lower lobe groundglass pulmonary nodule  Post-op Diagnosis: Same  Surgeon: Lamar Chris  Assistants: None  Anesthesia: General endotracheal anesthesia  Operation: Flexible video fiberoptic bronchoscopy with robotic assistance and biopsies.  Estimated Blood Loss: Minimal  Complications: None  Indications and History: Holly Riley is a 68 y.o. female with history of former tobacco use with slight interval change in a groundglass right lower lobe pulmonary nodule on serial imaging.  Recommendation made to achieve a tissue diagnosis via robotic assisted navigational bronchoscopy.  The risks, benefits, complications, treatment options and expected outcomes were discussed with the patient.  The possibilities of pneumothorax, pneumonia, reaction to medication, pulmonary aspiration, perforation of a viscus, bleeding, failure to diagnose a condition and creating a complication requiring transfusion or operation were discussed with the patient who freely signed the consent.    Description of Procedure: The patient was seen in the Preoperative Area, was examined and was deemed appropriate to proceed.  The patient was taken to [facility] Endoscopy room 3, identified as [name] and the procedure verified as Flexible Video Fiberoptic Bronchoscopy.  A Time Out was held and the above information confirmed.   Prior to the date of the procedure a high-resolution CT scan of the chest was performed. Utilizing ION software program a virtual tracheobronchial tree was generated to allow the creation of distinct navigation pathways to the patient's parenchymal abnormalities. After being taken to the operating room general anesthesia was initiated and the patient  was orally intubated.  The patient was placed in left lateral decubitus position.  The video fiberoptic bronchoscope was  introduced via the endotracheal tube and a general inspection was performed which showed normal right and left lung anatomy. Aspiration of the bilateral mainstems was completed to remove any remaining secretions. Robotic catheter inserted into patient's endotracheal tube.   Target #1 right lower lobe nodule: The distinct navigation pathways prepared prior to this procedure were then utilized to navigate to patient's lesion identified on CT scan. The robotic catheter was secured into place and the vision probe was withdrawn.  Lesion location was approximated using fluoroscopy.  Local registration and targeting was performed using [employer] Cios mobile C-arm three-dimensional imaging. Under fluoroscopic guidance transbronchial needle biopsies and transbronchial cryoprobe biopsies were performed to be sent for cytology and pathology.  Needle-in-lesion was confirmed using Cios mobile C-arm.  Under fluoroscopic guidance a single fiducial marker was placed adjacent to the nodule.   At the end of the procedure a general airway inspection was performed and there was no evidence of active bleeding. The bronchoscope was removed.  The patient tolerated the procedure well. There was no significant blood loss and there were no obvious complications. A post-procedural chest x-ray is pending.  Samples Target #1: 1. Transbronchial Wang needle biopsies from right lower lobe nodule 2. Transbronchial cryoprobe biopsies from right lower lobe nodule   Plans:  The patient will be discharged from the PACU to home when recovered from anesthesia and after chest x-ray is reviewed. We will review the cytology, pathology and microbiology results with the patient when they become available. Outpatient followup will be with CANDIE Lites, NP and Dr Chris.    Lamar Chris, MD, PhD 11/01/2023, 9:04 AM [facility] [phone] or if no answer before 7:00PM call [phone] For any issues after  7:00PM please call eLink [phone]

## 2023-11-01 NOTE — Discharge Instructions (Addendum)

## 2023-11-01 NOTE — Interval H&P Note (Signed)
 History and Physical Interval Note:  11/01/2023 7:28 AM  Holly Riley  has presented today for surgery, with the diagnosis of lung nodule.  The various methods of treatment have been discussed with the patient and family. After consideration of risks, benefits and other options for treatment, the patient has consented to  Procedure(s): VIDEO BRONCHOSCOPY WITH ENDOBRONCHIAL NAVIGATION (Right) as a surgical intervention.  The patient's history has been reviewed, patient examined, no change in status, stable for surgery.  I have reviewed the patient's chart and labs.  Questions were answered to the patient's satisfaction.     Lamar GORMAN Chris

## 2023-11-01 NOTE — Op Note (Signed)
 Procedure Note  Patient: Holly Riley  [employer] Cios mobile C-arm was utilized to identify and biopsy right lower lobe groundglass pulmonary nodule.  Needle-in-lesion was confirmed using real-time Cios imaging, and images were uploaded to PACS.       Lamar Chris, MD, PhD 11/01/2023, 9:13 AM [facility] [phone] or if no answer before 7:00PM call [phone] For any issues after 7:00PM please call eLink [phone]

## 2023-11-01 NOTE — Anesthesia Procedure Notes (Signed)
 Procedure Name: Intubation Date/Time: 11/01/2023 7:50 AM  Performed by: Vera Rochele PARAS, CRNAPre-anesthesia Checklist: Patient identified, Emergency Drugs available, Suction available and Patient being monitored Patient Re-evaluated:Patient Re-evaluated prior to induction Oxygen Delivery Method: Circle System Utilized Preoxygenation: Pre-oxygenation with 100% oxygen Induction Type: IV induction Ventilation: Mask ventilation without difficulty Laryngoscope Size: Glidescope and 3 Grade View: Grade I Tube type: Oral Tube size: 8.5 mm Number of attempts: 1 Airway Equipment and Method: Stylet and Oral airway Placement Confirmation: ETT inserted through vocal cords under direct vision, positive ETCO2 and breath sounds checked- equal and bilateral Secured at: 21 cm Tube secured with: Tape Dental Injury: Teeth and Oropharynx as per pre-operative assessment

## 2023-11-01 NOTE — Transfer of Care (Signed)
 Immediate Anesthesia Transfer of Care Note  Patient: Holly Riley  Procedure(s) Performed: VIDEO BRONCHOSCOPY WITH ENDOBRONCHIAL NAVIGATION (Right)  Patient Location: PACU  Anesthesia Type:General  Level of Consciousness: awake, alert , and oriented  Airway & Oxygen Therapy: Patient Spontanous Breathing and Patient connected to face mask oxygen  Post-op Assessment: Report given to RN and Post -op Vital signs reviewed and stable  Post vital signs: Reviewed and stable  Last Vitals:  Vitals Value Taken Time  BP 122/65 11/01/23 09:15  Temp 36.7 C 11/01/23 09:15  Pulse 84 11/01/23 09:18  Resp 21 11/01/23 09:18  SpO2 96 % 11/01/23 09:18  Vitals shown include unfiled device data.  Last Pain:  Vitals:   11/01/23 0700  TempSrc:   PainSc: 0-No pain         Complications: No notable events documented.

## 2023-11-01 NOTE — Anesthesia Postprocedure Evaluation (Signed)
 Anesthesia Post Note  Patient: Holly Riley  Procedure(s) Performed: VIDEO BRONCHOSCOPY WITH ENDOBRONCHIAL NAVIGATION (Right)     Patient location during evaluation: PACU Anesthesia Type: General Level of consciousness: awake and alert, oriented and patient cooperative Pain management: pain level controlled Vital Signs Assessment: post-procedure vital signs reviewed and stable Respiratory status: spontaneous breathing, nonlabored ventilation and respiratory function stable Cardiovascular status: blood pressure returned to baseline and stable Postop Assessment: no apparent nausea or vomiting Anesthetic complications: no   No notable events documented.  Last Vitals:  Vitals:   11/01/23 0930 11/01/23 0945  BP: (!) 120/48 112/73  Pulse: 81 80  Resp: 20 (!) 22  Temp:  36.7 C  SpO2: 95% 96%    Last Pain:  Vitals:   11/01/23 0945  TempSrc:   PainSc: 0-No pain                 [name]

## 2023-11-01 NOTE — Anesthesia Preprocedure Evaluation (Addendum)
 Anesthesia Evaluation  Patient identified by MRN, date of birth, ID band Patient awake    Reviewed: Allergy & Precautions, H&P , NPO status , Patient's Chart, lab work & pertinent test results  Airway Mallampati: IV  TM Distance: >3 FB Neck ROM: Full    Dental  (+) Teeth Intact, Dental Advisory Given   Pulmonary former smoker Former smoker 30 pack year history    Pulmonary exam normal breath sounds clear to auscultation       Cardiovascular METS: > 9 Mets hypertension (157/75 preop), Pt. on medications Normal cardiovascular exam Rhythm:Regular Rate:Normal     Neuro/Psych  negative psych ROS   GI/Hepatic Neg liver ROS,GERD  Controlled,,  Endo/Other  diabetesHypothyroidism    Renal/GU negative Renal ROS  negative genitourinary   Musculoskeletal  (+) Arthritis , Osteoarthritis,    Abdominal   Peds negative pediatric ROS (+)  Hematology negative hematology ROS (+)   Anesthesia Other Findings   Reproductive/Obstetrics negative OB ROS                              Anesthesia Physical Anesthesia Plan  ASA: 3  Anesthesia Plan: General   Post-op Pain Management: Tylenol  PO (pre-op)*   Induction: Intravenous  PONV Risk Score and Plan: 3 and Ondansetron , Dexamethasone , Midazolam  and Treatment may vary due to age or medical condition  Airway Management Planned: Oral ETT  Additional Equipment: None  Intra-op Plan:   Post-operative Plan: Extubation in OR  Informed Consent: I have reviewed the patients History and Physical, chart, labs and discussed the procedure including the risks, benefits and alternatives for the proposed anesthesia with the patient or authorized representative who has indicated his/her understanding and acceptance.     Dental advisory given  Plan Discussed with: CRNA  Anesthesia Plan Comments:          Anesthesia Quick Evaluation

## 2023-11-02 LAB — CYTOLOGY - NON PAP

## 2023-11-03 ENCOUNTER — Encounter (HOSPITAL_COMMUNITY): Payer: Self-pay | Admitting: Emergency Medicine

## 2023-11-08 ENCOUNTER — Ambulatory Visit (INDEPENDENT_AMBULATORY_CARE_PROVIDER_SITE_OTHER): Admitting: Family Medicine

## 2023-11-08 ENCOUNTER — Encounter: Payer: Self-pay | Admitting: Family Medicine

## 2023-11-08 VITALS — BP 145/83 | HR 78 | Temp 97.9°F | Ht 66.0 in | Wt 195.0 lb

## 2023-11-08 DIAGNOSIS — I10 Essential (primary) hypertension: Secondary | ICD-10-CM | POA: Diagnosis not present

## 2023-11-08 DIAGNOSIS — E119 Type 2 diabetes mellitus without complications: Secondary | ICD-10-CM | POA: Diagnosis not present

## 2023-11-08 DIAGNOSIS — Z9889 Other specified postprocedural states: Secondary | ICD-10-CM | POA: Diagnosis not present

## 2023-11-08 DIAGNOSIS — R300 Dysuria: Secondary | ICD-10-CM | POA: Diagnosis not present

## 2023-11-08 LAB — POCT URINALYSIS DIPSTICK
Bilirubin, UA: NEGATIVE
Blood, UA: NEGATIVE
Glucose, UA: NEGATIVE
Ketones, UA: NEGATIVE
Leukocytes, UA: NEGATIVE
Nitrite, UA: NEGATIVE
Protein, UA: NEGATIVE
Spec Grav, UA: 1.015 (ref 1.010–1.025)
Urobilinogen, UA: 0.2 U/dL
pH, UA: 6 (ref 5.0–8.0)

## 2023-11-08 NOTE — Progress Notes (Signed)
 Subjective  CC:  Chief Complaint  Patient presents with   Burning with urination    Burning with urination for the past week     HPI: Holly Riley is a 68 y.o. female who presents to the office today to address the problems listed above in the chief complaint. 68 year old female complains of 2 days of dysuria.  Recently treated for E. coli UTI with Keflex , August 30.  I reviewed urgent care notes and urine culture.  Symptoms were resolved after that treatment.  In the interim, she had bronchoscopy for an abnormal chest CT and PET scan.  Cytology revealed atypical adenomatous hyperplasia.  She has follow-up for that on Friday.  She has been very stressed appropriately.  It is likely that she had a Foley catheter during her procedure.  She has no vaginal discharge or vaginal itching.  No redness or swelling.  No systemic symptoms. Type 2 diabetes: Has been well-controlled.  She does report that her sugars have been elevated likely stress-induced. Assessment  1. Burning with urination   2. History of lung biopsy   3. Controlled type 2 diabetes mellitus without complication, without long-term current use of insulin  (HCC)   4. White coat syndrome with diagnosis of hypertension      Plan  Dysuria: Urine dipstick is completely normal today.  Will check culture to ensure no infection.  Dysuria could be coming from soreness after Foley catheterization.  Patient will monitor. Abnormal lung biopsy showing adenomatous hyperplasia: Has follow-up on Friday.  Discussed possible treatment options versus surveillance Type 2 diabetes: Mildly elevated blood sugars.  Monitor diet.  Check sugars.  Has follow-up at the end of this month Elevated blood pressure: Likely stress-induced.  Will monitor  Follow up: As scheduled 11/29/2023  Orders Placed This Encounter  Procedures   Urine Culture   POCT Urinalysis Dipstick   No orders of the defined types were placed in this encounter.     I reviewed  the patients updated PMH, FH, and SocHx.    Patient Active Problem List   Diagnosis Date Noted   White coat syndrome with diagnosis of hypertension 07/27/2023    Priority: High   History of colonic polyps 12/26/2022    Priority: High   Family history of colon cancer 12/26/2022    Priority: High   Statin intolerance 03/20/2017    Priority: High   Controlled type 2 diabetes mellitus without complication, without long-term current use of insulin  (HCC) 02/17/2017    Priority: High   Combined hyperlipidemia associated with type 2 diabetes mellitus (HCC) 02/17/2017    Priority: High   Spinal stenosis     Priority: High   Hypothyroidism (acquired) 03/26/2013    Priority: High   Obesity (BMI 30.0-34.9) 01/22/2013    Priority: High   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  Coenzyme Q10 (CO Q 10 PO)    Flaxseed, Linseed, (FLAX SEED OIL PO) Take 1 tablet by mouth daily.   Garlic 10 MG CAPS Take 10 mg by mouth daily.   Krill Oil 500 MG CAPS    levothyroxine  (SYNTHROID ) 88 MCG tablet Take 1 tablet (88 mcg total) by mouth daily before breakfast.   lisinopril  (ZESTRIL ) 10 MG tablet Take 10 mg by mouth daily.   loratadine (CLARITIN) 10 MG tablet Take 10 mg by mouth daily.   Methylcobalamin (B12) 5000 MCG SUBL    Omega-3 Fatty Acids (FISH OIL CONCENTRATE) 1000 MG CAPS Take 1,000 mg by mouth daily.   Red Yeast Rice 600 MG TABS      Allergies: Patient is allergic to hydrocodone-acetaminophen , morphine, morphine and codeine , oxycodone -acetaminophen , ezetimibe , hydromorphone, statins, flonase  [fluticasone ], percocet [oxycodone -acetaminophen ], singulair  [montelukast ], and vicodin [hydrocodone-acetaminophen ]. Family History: Patient family history includes Breast cancer in her sister; Cancer in her father, mother, and sister; Diabetes in her brother, father, and sister; Heart disease in her father; Hyperlipidemia in her father, mother, sister, and sister; Hypertension in her father. Social History:  Patient  reports that she quit smoking about 14 years ago. Her smoking use included cigarettes. She started smoking about 54 years ago. She has a 30 pack-year smoking history. She has been exposed to tobacco smoke. She has never used smokeless tobacco. She reports that she does not drink alcohol and does not use drugs.  Review of Systems: Constitutional: Negative for fever malaise or anorexia Cardiovascular: negative for chest pain Respiratory: negative for SOB or persistent cough Gastrointestinal: negative for abdominal pain  Objective  Vitals: BP (!) 145/83 Comment: Stress induced  Pulse 78   Temp 97.9 F (36.6 C)   Ht 5' 6 (1.676 m)   Wt 195 lb (88.5 kg)   SpO2 97%   BMI 31.47 kg/m  General: no acute distress , A&Ox3  Office Visit on 11/08/2023  Component Date Value Ref Range Status   Color, UA 11/08/2023 yellow   Final   Clarity, UA 11/08/2023 clear   Final   Glucose, UA 11/08/2023 Negative  Negative Final   Bilirubin, UA 11/08/2023 negative   Final   Ketones, UA 11/08/2023 negative   Final   Spec Grav, UA 11/08/2023 1.015  1.010 - 1.025 Final   Blood, UA 11/08/2023 negative   Final   pH, UA 11/08/2023 6.0  5.0 - 8.0 Final   Protein, UA 11/08/2023 Negative  Negative Final   Urobilinogen, UA 11/08/2023 0.2  0.2 or 1.0 E.U./dL Final   Nitrite, UA 90/90/7974 negative   Final   Leukocytes, UA 11/08/2023  Negative  Negative Final     Commons side effects, risks, benefits, and alternatives for medications and treatment plan prescribed today were discussed, and the patient expressed understanding of the given instructions. Patient is instructed to call or message via MyChart if he/she has any questions or concerns regarding our treatment plan. No barriers to understanding were identified. We discussed Red Flag symptoms and signs in detail. Patient expressed understanding regarding what to do in case of urgent or emergency type symptoms.  Medication list was reconciled, printed and provided to the patient in AVS. Patient instructions and summary information was reviewed with the patient as documented in the AVS. This note was prepared with assistance of Dragon voice recognition software. Occasional wrong-word or sound-a-like substitutions may have occurred due to the inherent limitations of voice recognition software

## 2023-11-09 LAB — URINE CULTURE
MICRO NUMBER:: 16942988
Result:: NO GROWTH
SPECIMEN QUALITY:: ADEQUATE

## 2023-11-10 ENCOUNTER — Ambulatory Visit: Payer: Self-pay | Admitting: Family

## 2023-11-11 ENCOUNTER — Ambulatory Visit: Admitting: Acute Care

## 2023-11-11 ENCOUNTER — Encounter: Payer: Self-pay | Admitting: Acute Care

## 2023-11-11 VITALS — BP 142/68 | HR 92 | Temp 97.8°F | Ht 66.0 in | Wt 195.6 lb

## 2023-11-11 DIAGNOSIS — Z87891 Personal history of nicotine dependence: Secondary | ICD-10-CM | POA: Diagnosis not present

## 2023-11-11 DIAGNOSIS — R942 Abnormal results of pulmonary function studies: Secondary | ICD-10-CM

## 2023-11-11 DIAGNOSIS — Z9889 Other specified postprocedural states: Secondary | ICD-10-CM

## 2023-11-11 DIAGNOSIS — R911 Solitary pulmonary nodule: Secondary | ICD-10-CM | POA: Diagnosis not present

## 2023-11-11 DIAGNOSIS — R9389 Abnormal findings on diagnostic imaging of other specified body structures: Secondary | ICD-10-CM

## 2023-11-11 NOTE — Progress Notes (Signed)
 History of Present Illness Holly Riley is a 68 y.o. female former smoker followed through the lung cancer screening program referred for an abnormal pulmonary lung nodule.    11/11/2023 Discussed the use of AI scribe software for clinical note transcription with the patient, who gave verbal consent to proceed.  Synopsis 68 y.o. 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 She was here for consult 09/2023 after abnormal chest imaging. PET scan was done as follow up to the abnormal LDCT Chest. 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 We recommended a robotic  assisted navigational bronchoscopy with biopsies . She has a strong belief that when cancer is biopsied, it seeds the rest of the body. She underwent the procedure 11/01/2023. She is here today for follow up.   History of Present Illness Pt. Presents for follow up after bronchoscopy with biopsies on 11/01/2023.  She states she has done well postprocedure.  No hemoptysis, dyspnea, discolored secretions, fever, worsening shortness of breath, or adverse reaction to anesthesia.  We have reviewed the results of her biopsy.  The right lower lobe biopsy came back as atypical cells.  Per the pathology note, a definite in situ malignancy has not been identified.  They recommend continued surveillance.  Plan will be for 66-month follow-up CT chest to monitor the lung nodule for growth or changes.  I have asked the patient to call to be seen sooner for incidental weight loss or for hemoptysis.  Patient verbalized understanding of the above, she is in agreement with a 76-month follow-up CT scan and then follow-up in the office with me to review the results.     Test Results: 11/01/2023 cytology FINAL MICROSCOPIC DIAGNOSIS:  A. LUNG, RLL, FINE NEEDLE ASPIRATION  BIOPSY:  - Atypical.   COMMENT:   The cell block sections  contain lung tissue fragments with  peribronchiolar metaplasia and evidence of pneumocyte hyperplasia with  atypia (atypical adenomatous hyperplasia).   Definite in situ malignancy  is not identified.  Surveillance is suggested.      Latest Ref Rng & Units 11/01/2023    6:45 AM 11/22/2022    9:12 AM 10/19/2021    9:41 AM  CBC  WBC 4.0 - 10.5 K/uL 7.9  6.7  7.3   Hemoglobin 12.0 - 15.0 g/dL 84.7  85.0  85.1   Hematocrit 36.0 - 46.0 % 46.1  45.1  44.7   Platelets 150 - 400 K/uL 259  276.0  258.0        Latest Ref Rng & Units 11/01/2023    6:45 AM 11/22/2022    9:12 AM 10/19/2021    9:41 AM  BMP  Glucose 70 - 99 mg/dL 832  865  874   BUN 8 - 23 mg/dL 12  17  12    Creatinine 0.44 - 1.00 mg/dL 9.00  9.17  9.17   Sodium 135 - 145 mmol/L 143  140  141   Potassium 3.5 - 5.1 mmol/L 4.2  4.0  3.8   Chloride 98 - 111 mmol/L 104  106  104   CO2 22 - 32 mmol/L 22  26  27    Calcium  8.9 - 10.3 mg/dL 9.2  9.2  9.1     BNP No results found for: BNP  ProBNP No results found for: PROBNP  PFT    Component Value Date/Time   FEV1PRE 2.33 10/19/2023 1044  FEV1POST 2.45 10/19/2023 1044   FVCPRE 2.83 10/19/2023 1044   FVCPOST 2.87 10/19/2023 1044   TLC 5.11 10/19/2023 1044   DLCOUNC 19.92 10/19/2023 1044   PREFEV1FVCRT 82 10/19/2023 1044   PSTFEV1FVCRT 85 10/19/2023 1044    DG Chest Port 1 View Result Date: 11/01/2023 CLINICAL DATA:  Status post bronchoscopy with biopsy. EXAM: PORTABLE CHEST 1 VIEW COMPARISON:  October 07, 2023. FINDINGS: The heart size and mediastinal contours are within normal limits. Both lungs are clear. No pneumothorax or pleural effusion is noted. The visualized skeletal structures are unremarkable. IMPRESSION: No active disease. Electronically Signed   By: Lynwood Landy Raddle M.D.   On: 11/01/2023 10:24   DG C-ARM BRONCHOSCOPY Result Date: 11/01/2023 C-ARM BRONCHOSCOPY: Fluoroscopy was utilized by the requesting physician.  No radiographic interpretation.   DG C-Arm 1-60  Min-No Report Result Date: 11/01/2023 Fluoroscopy was utilized by the requesting physician.  No radiographic interpretation.     Past medical hx Past Medical History:  Diagnosis Date   Allergy    seasonal and cats   Arthritis    Cataract    Diabetes mellitus without complication (HCC)    HTN (hypertension)    Hyperlipidemia    Hypothyroidism    Spinal stenosis    NS - early, MRI   Thyroid  disease      Social History   Tobacco Use   Smoking status: Former    Current packs/day: 0.00    Average packs/day: 0.8 packs/day for 40.0 years (30.0 ttl pk-yrs)    Types: Cigarettes    Start date: 03/01/1969    Quit date: 03/01/2009    Years since quitting: 14.7    Passive exposure: Past   Smokeless tobacco: Never  Vaping Use   Vaping status: Never Used  Substance Use Topics   Alcohol use: No   Drug use: No    Holly Riley reports that she quit smoking about 14 years ago. Her smoking use included cigarettes. She started smoking about 54 years ago. She has a 30 pack-year smoking history. She has been exposed to tobacco smoke. She has never used smokeless tobacco. She reports that she does not drink alcohol and does not use drugs.  Tobacco Cessation: Counseling given: Not Answered Former smoker with a 30-pack-year smoking history, quit 2011  Past surgical hx, Family hx, Social hx all reviewed.  Current Outpatient Medications on File Prior to Visit  Medication Sig   Cholecalciferol (VITAMIN D3) 50 MCG (2000 UT) capsule Take 2,000 Units by mouth daily.    Coenzyme Q10 (CO Q 10 PO)    Flaxseed, Linseed, (FLAX SEED OIL PO) Take 1 tablet by mouth daily.   Garlic 10 MG CAPS Take 10 mg by mouth daily.   Krill Oil 500 MG CAPS    levothyroxine  (SYNTHROID ) 88 MCG tablet Take 1 tablet (88 mcg total) by mouth daily before breakfast.   lisinopril  (ZESTRIL ) 10 MG tablet Take 10 mg by mouth daily.   loratadine (CLARITIN) 10 MG tablet Take 10 mg by mouth daily.   Methylcobalamin (B12) 5000 MCG SUBL     Omega-3 Fatty Acids (FISH OIL CONCENTRATE) 1000 MG CAPS Take 1,000 mg by mouth daily.   Red Yeast Rice 600 MG TABS    No current facility-administered medications on file prior to visit.     Allergies  Allergen Reactions   Hydrocodone-Acetaminophen  Itching    Other Reaction(s): Unknown   Morphine     Other Reaction(s): Other, Unknown  hallucinations, hallucinations, hallucinations  Other Reaction(s): Unknown    hallucinations hallucinations hallucinations  hallucinations, hallucinations, hallucinations  Other Reaction(s): Unknown  hallucinations hallucinations hallucinations    hallucinations hallucinations hallucinations   Morphine And Codeine  Other (See Comments)    hallucinations hallucinations   Oxycodone -Acetaminophen  Itching    Other Reaction(s): Other, Unknown  tachycardia    Other Reaction(s): Unknown  tachycardia  Other Reaction(s): Unknown    tachycardia   Ezetimibe  Other (See Comments)    myalgias  Other Reaction(s): Other   Hydromorphone     hallucentations  Other Reaction(s): Hallucinations, Unknown  Other Reaction(s): Unknown    hallucentations  Other Reaction(s): Unknown  hallucentations    hallucentations   Statins Other (See Comments)    myalgis  Other Reaction(s): Unknown  Other Reaction(s): Other  myalgis, myalgis    Other Reaction(s): Other (See Comments)    myalgis    myalgis myalgis  myalgis, myalgis  Other Reaction(s): Other (See Comments)  myalgis  myalgis myalgis    myalgis myalgis   Flonase  [Fluticasone ] Other (See Comments)    Causes nose bleeds   Percocet [Oxycodone -Acetaminophen ] Itching and Other (See Comments)    tachycardia   Singulair  [Montelukast ] Other (See Comments)    Insomnia    Vicodin [Hydrocodone-Acetaminophen ]     Review Of Systems:  Constitutional:   No  weight loss, night sweats,  Fevers, chills, fatigue, or  lassitude.  HEENT:   No headaches,  Difficulty swallowing,  Tooth/dental  problems, or  Sore throat,                No sneezing, itching, ear ache, nasal congestion, post nasal drip,   CV:  No chest pain,  Orthopnea, PND, swelling in lower extremities, anasarca, dizziness, palpitations, syncope.   GI  No heartburn, indigestion, abdominal pain, nausea, vomiting, diarrhea, change in bowel habits, loss of appetite, bloody stools.   Resp: No shortness of breath with exertion or at rest.  No excess mucus, no productive cough,  No non-productive cough,  No coughing up of blood.  No change in color of mucus.  No wheezing.  No chest wall deformity  Skin: no rash or lesions.  GU: no dysuria, change in color of urine, no urgency or frequency.  No flank pain, no hematuria   MS:  No joint pain or swelling.  No decreased range of motion.  No back pain.  Psych:  No change in mood or affect. No depression or anxiety.  No memory loss.   Vital Signs BP (!) 142/68   Pulse 92   Temp 97.8 F (36.6 C) (Oral)   Ht 5' 6 (1.676 m)   Wt 195 lb 9.6 oz (88.7 kg)   SpO2 97%   BMI 31.57 kg/m    Physical Exam:  General- No distress,  A&Ox3, pleasant and appropriate ENT: No sinus tenderness, TM clear, pale nasal mucosa, no oral exudate,no post nasal drip, no LAN Cardiac: S1, S2, regular rate and rhythm, no murmur Chest: No wheeze/ rales/ dullness; no accessory muscle use, no nasal flaring, no sternal retractions, good bilateral breath sounds throughout Abd.: Soft Non-tender, nondistended, bowel sounds positive,Body mass index is 31.57 kg/m.  Ext: No clubbing cyanosis, edema, no obvious deformities Neuro:  normal strength, moving all extremities x 4, alert and oriented x 3, appropriate Skin: No rashes, warm and dry, no obvious skin lesions Psych: normal mood and behavior    Assessment & Plan Post bronchoscopy with biopsies online to Former smoker Abnormal lung nodule on chest imaging Plan I  am glad you did well after the procedure. We have reviewed the results of the  biopsy . This showed atypical cells. These are not normal cells. We will do a 3 month follow up CT chest to monitor for growth. This will be due December 2025. You will get a call to get this scheduled.  Follow up with Lauraine NP 1-2 weeks after the scan and we will review the results together. Call for any unintentional weight loss or blood in sputum to be seen sooner.  Please contact office for sooner follow up if symptoms do not improve or worsen or seek emergency care    I spent 25 minutes dedicated to the care of this patient on the date of this encounter to include pre-visit review of records, face-to-face time with the patient discussing conditions above, post visit ordering of testing, clinical documentation with the electronic health record, making appropriate referrals as documented, and communicating necessary information to the patient's healthcare team.      Lauraine JULIANNA Lites, NP 11/11/2023  10:12 AM

## 2023-11-11 NOTE — Patient Instructions (Signed)
 It is good to see you today.  I am glad you did well after the procedure. We have reviewed the results of the biopsy . This showed atypical cells. These are not normal cells. We will do a 3 month follow up CT chest to monitor for growth. This will be due December 2025. You will get a call to get this scheduled.  Follow up with Lauraine NP 1-2 weeks after the scan and we will review the results together. Call for any unintentional weight loss or blood in sputum to be seen sooner.  Please contact office for sooner follow up if symptoms do not improve or worsen or seek emergency care

## 2023-11-29 ENCOUNTER — Ambulatory Visit: Admitting: Family Medicine

## 2023-11-29 ENCOUNTER — Encounter: Payer: Self-pay | Admitting: Family Medicine

## 2023-11-29 VITALS — BP 127/79 | HR 75 | Temp 97.7°F | Ht 66.0 in | Wt 198.2 lb

## 2023-11-29 DIAGNOSIS — M48061 Spinal stenosis, lumbar region without neurogenic claudication: Secondary | ICD-10-CM

## 2023-11-29 DIAGNOSIS — E039 Hypothyroidism, unspecified: Secondary | ICD-10-CM | POA: Diagnosis not present

## 2023-11-29 DIAGNOSIS — Z789 Other specified health status: Secondary | ICD-10-CM

## 2023-11-29 DIAGNOSIS — Z800 Family history of malignant neoplasm of digestive organs: Secondary | ICD-10-CM

## 2023-11-29 DIAGNOSIS — Z0000 Encounter for general adult medical examination without abnormal findings: Secondary | ICD-10-CM | POA: Diagnosis not present

## 2023-11-29 DIAGNOSIS — E559 Vitamin D deficiency, unspecified: Secondary | ICD-10-CM | POA: Diagnosis not present

## 2023-11-29 DIAGNOSIS — E1169 Type 2 diabetes mellitus with other specified complication: Secondary | ICD-10-CM | POA: Diagnosis not present

## 2023-11-29 DIAGNOSIS — E782 Mixed hyperlipidemia: Secondary | ICD-10-CM | POA: Diagnosis not present

## 2023-11-29 DIAGNOSIS — I10 Essential (primary) hypertension: Secondary | ICD-10-CM

## 2023-11-29 DIAGNOSIS — Z0001 Encounter for general adult medical examination with abnormal findings: Secondary | ICD-10-CM

## 2023-11-29 DIAGNOSIS — E119 Type 2 diabetes mellitus without complications: Secondary | ICD-10-CM

## 2023-11-29 MED ORDER — PROGESTERONE MICRONIZED 100 MG PO CAPS: 100.0000 mg | ORAL_CAPSULE | Freq: Every day | ORAL | 3 refills | Status: DC

## 2023-11-29 NOTE — Patient Instructions (Signed)
 Please return in 12 months for your annual complete physical; please come fasting.  ? ?If you have any questions or concerns, please don't hesitate to send me a message via MyChart or call the office at [phone]. Thank you for visiting with [place] today! It's our pleasure caring for you.  ?

## 2023-11-29 NOTE — Progress Notes (Signed)
 Subjective  Chief Complaint  Patient presents with   Annual Exam    Pt here for Annual Exam and is currently fasting. DEXA and Mammo is scheduled for 10/9.   Diabetes    HPI: Holly Riley is a 68 y.o. female who presents to [facility] [place] today for a Female Wellness Visit. She also has the concerns and/or needs as listed above in the chief complaint. These will be addressed in addition to the Health Maintenance Visit.   Wellness Visit: annual visit with health maintenance review and exam  HM: having mammo and dexa next week. CRC is current. Feels well overall. Had complete blood panel done with [facility] for my review. See scanned in labs.  Chronic disease f/u and/or acute problem visit: (deemed necessary to be done in addition to the wellness visit): Longevity and wellness: High cortisol level, low DHEA, low estradiol  progesterone and testosterone as expected in this postmenopausal female.  Normal thyroid  function, negative thyroid  antibodies.  Vitamin D  49.  Lipid levels elevated above goal but intolerant to statin and Zetia .  Diabetic with A1c of 6.  3.  Diet controlled.  No symptoms of hyper per glycemia. Osteoarthritis and mild pain.  No changes [place] hypertension with normal blood pressures at home.  Assessment  1. Encounter for well adult exam with abnormal findings   2. Controlled type 2 diabetes mellitus without complication, without long-term current use of insulin  (HCC)   3. Combined hyperlipidemia associated with type 2 diabetes mellitus (HCC)   4. Family history of colon cancer   5. Hypothyroidism (acquired)   6. Spinal stenosis of lumbar region without neurogenic claudication   7. Statin intolerance   8. White coat syndrome with diagnosis of hypertension   9. Vitamin D  deficiency      Plan  Female Wellness Visit: Age appropriate Health Maintenance and Prevention measures were discussed with patient. Included topics are cancer  screening recommendations, ways to keep healthy (see AVS) including dietary and exercise recommendations, regular eye and dental care, use of seat belts, and avoidance of moderate alcohol use and tobacco use.  Screens are scheduled. BMI: discussed patient's BMI and encouraged positive lifestyle modifications to help get to or maintain a target BMI. HM needs and immunizations were addressed and ordered. See below for orders. See HM and immunization section for updates. Routine labs and screening tests ordered including cmp, cbc and lipids where appropriate. Discussed recommendations regarding Vit D and calcium  supplementation (see AVS)  Chronic disease management visit and/or acute problem visit: Diabetes is diet controlled.  Educated on possible use of Mounjaro for diabetic control, metabolic improvement and weight loss.  Patient to consider.  Needs urine nephropathy screen. Blood pressure is controlled by home readings.  She has risk factors for heart disease including family history.  Intolerant to statins.  Check cardiac calcium  score for further restratification.  Will help us  with lipid management. Increase vitamin D  on weekends to get levels above 50. Discussed stress management. Poor sleep, start progesterone therapy.  100 mg micronized progesterone nightly for sleep.  Consider HRT.  Follow up: 1 year for complete physical Orders Placed This Encounter  Procedures   CT CARDIAC SCORING   No orders of the defined types were placed in this encounter.     Body mass index is 31.99 kg/m. Wt Readings from Last 3 Encounters:  11/29/23 198 lb 3.2 oz (89.9 kg)  11/11/23 195 lb 9.6 oz (88.7 kg)  11/08/23 195 lb (  88.5 kg)     Patient Active Problem List   Diagnosis Date Noted   White coat syndrome with diagnosis of hypertension 07/27/2023    Priority: High   History of colonic polyps 12/26/2022    Priority: High   Family history of colon cancer 12/26/2022    Priority: High   Statin  intolerance 03/20/2017    Priority: High    Myalgias, simvistatin and crestor     Controlled type 2 diabetes mellitus without complication, without long-term current use of insulin  (HCC) 02/17/2017    Priority: High    Diet controlled    Combined hyperlipidemia associated with type 2 diabetes mellitus (HCC) 02/17/2017    Priority: High    Statin intolerant; zetia  intolerant.  Myalgias with simvastatin and atrovastatin and zetia ; pt declines further medications.  Takes red yeast rice and diet    Spinal stenosis     Priority: High    NS - early, MRI    Hypothyroidism (acquired) 03/26/2013    Priority: High   Obesity (BMI 30.0-34.9) 01/22/2013    Priority: High   Diverticular disease of colon 12/26/2022    Priority: Medium    Gastroesophageal reflux disease 12/26/2022    Priority: Medium    Obesity 12/26/2022    Priority: Medium    Insomnia 09/11/2013    Priority: Medium    Osteoarthritis 04/28/2011    Priority: Medium    Osteoporosis screening 11/16/2018    Priority: Low    Normal bone density 2020; recheck 2025    Vitamin D  deficiency 03/20/2017    Priority: Low   Allergic rhinitis 11/25/2009    Priority: Low   Lung nodule seen on imaging study 10/20/2023   Health Maintenance  Topic Date Due   DEXA SCAN  11/10/2023   HEMOGLOBIN A1C  11/23/2023   Mammogram  12/02/2023   COVID-19 Vaccine (1 - 2024-25 season) 12/15/2023 (Originally 10/31/2023)   OPHTHALMOLOGY EXAM  01/19/2024   Diabetic kidney evaluation - Urine ACR  05/22/2024   Medicare Annual Wellness (AWV)  06/05/2024   Lung Cancer Screening  09/06/2024   DTaP/Tdap/Td (3 - Td or Tdap) 10/18/2024   Diabetic kidney evaluation - eGFR measurement  10/31/2024   FOOT EXAM  11/28/2024   Colonoscopy  01/27/2031   Pneumococcal Vaccine: 50+ Years  Completed   Hepatitis C Screening  Completed   Zoster Vaccines- Shingrix   Completed   HPV VACCINES  Aged Out   Meningococcal B Vaccine  Aged Out   Influenza Vaccine   Discontinued   Hepatitis B Vaccines 19-59 Average Risk  Discontinued   Immunization History  Administered Date(s) Administered   DTaP 04/27/2006   Fluad Quad(high Dose 65+) 12/29/2020   Hepatitis B, ADULT 04/28/1991   Hepatitis B, PED/ADOLESCENT 04/28/1991   Influenza Split 12/26/2007   Influenza, Quadrivalent, Recombinant, Inj, Pf 01/22/2013   Influenza, Seasonal, Injecte, Preservative Fre 12/13/2013, 11/20/2014   Influenza,inj,Quad PF,6+ Mos 12/17/2015, 01/07/2019   Influenza-Unspecified 12/14/2016, 02/08/2020   Pneumococcal Conjugate-13 11/23/2018   Pneumococcal Polysaccharide-23 08/27/2014, 07/04/2020   Tdap 10/19/2014   Zoster Recombinant(Shingrix ) 11/25/2020, 05/12/2021   We updated and reviewed the patient's past history in detail and it is documented below. Allergies: Patient is allergic to hydrocodone-acetaminophen , morphine, morphine and codeine , oxycodone -acetaminophen , ezetimibe , hydromorphone, statins, flonase  [fluticasone ], percocet [oxycodone -acetaminophen ], singulair  [montelukast ], and vicodin [hydrocodone-acetaminophen ]. Past Medical History Patient  has a past medical history of Allergy, Arthritis, Cataract, Diabetes mellitus without complication (HCC), HTN (hypertension), Hyperlipidemia, Hypothyroidism, Spinal stenosis, and Thyroid  disease. Past Surgical History Patient  has a past surgical history that includes Abdominal hysterectomy; Carpal tunnel release (Right); Knee surgery (Left); Tonsillectomy and adenoidectomy; Spine surgery; Cervical fusion; Lumbar laminectomy; Trigger finger release (Right, 09/27/2022); Cataract extraction (Bilateral); and Video bronchoscopy with endobronchial navigation (Right, 11/01/2023). Family History: Patient family history includes Breast cancer in her sister; Cancer in her father, mother, and sister; Diabetes in her brother, father, and sister; Heart disease in her father; Hyperlipidemia in her father, mother, sister, and sister;  Hypertension in her father. Social History:  Patient  reports that she quit smoking about 14 years ago. Her smoking use included cigarettes. She started smoking about 54 years ago. She has a 30 pack-year smoking history. She has been exposed to tobacco smoke. She has never used smokeless tobacco. She reports that she does not drink alcohol and does not use drugs.  Review of Systems: Constitutional: negative for fever or malaise Ophthalmic: negative for photophobia, double vision or loss of vision Cardiovascular: negative for chest pain, dyspnea on exertion, or new LE swelling Respiratory: negative for SOB or persistent cough Gastrointestinal: negative for abdominal pain, change in bowel habits or melena Genitourinary: negative for dysuria or gross hematuria, no abnormal uterine bleeding or disharge Musculoskeletal: negative for new gait disturbance or muscular weakness Integumentary: negative for new or persistent rashes, no breast lumps Neurological: negative for TIA or stroke symptoms Psychiatric: negative for SI or delusions Allergic/Immunologic: negative for hives  Patient Care Team    Relationship Specialty Notifications Start End  Jodie Lavern CROME, MD PCP - General Family Medicine  12/20/14   Gillie Duncans, MD Consulting Physician Neurosurgery  02/17/17   Patrcia Sharper, MD Consulting Physician Ophthalmology  05/17/17   Nicholaus Sherlean CROME, [facility] (Inactive)  Pharmacist  12/22/20    Comment: [phone]  Mammography, [facility]  Diagnostic Radiology  04/26/23     Objective  Vitals: BP 127/79   Pulse 75   Temp 97.7 F (36.5 C)   Ht 5' 6 (1.676 m)   Wt 198 lb 3.2 oz (89.9 kg)   SpO2 98%   BMI 31.99 kg/m  General:  Well developed, well nourished, no acute distress  Psych:  Alert and orientedx3,normal mood and affect HEENT:  Normocephalic, atraumatic, non-icteric sclera,  supple neck without adenopathy, mass or thyromegaly Cardiovascular:  Normal S1, S2, RRR without gallop, rub or  murmur Respiratory:  Good breath sounds bilaterally, CTAB with normal respiratory effort Gastrointestinal: normal bowel sounds, soft, non-tender, no noted masses. No HSM MSK: extremities without edema, joints without erythema or swelling Neurologic:    Mental status is normal.  Gross motor and sensory exams are normal.  No tremor  Commons side effects, risks, benefits, and alternatives for medications and treatment plan prescribed today were discussed, and the patient expressed understanding of the given instructions. Patient is instructed to call or message via MyChart if he/she has any questions or concerns regarding our treatment plan. No barriers to understanding were identified. We discussed Red Flag symptoms and signs in detail. Patient expressed understanding regarding what to do in case of urgent or emergency type symptoms.  Medication list was reconciled, printed and provided to the patient in AVS. Patient instructions and summary information was reviewed with the patient as documented in the AVS. This note was prepared with assistance of Dragon voice recognition software. Occasional wrong-word or sound-a-like substitutions may have occurred due to the inherent limitations of voice recognition software

## 2023-11-30 ENCOUNTER — Ambulatory Visit (HOSPITAL_COMMUNITY)
Admission: RE | Admit: 2023-11-30 | Discharge: 2023-11-30 | Disposition: A | Discharge status: Home / Self Care | Payer: Self-pay | Source: Ambulatory Visit | Attending: Cardiovascular Disease | Admitting: Cardiovascular Disease

## 2023-11-30 DIAGNOSIS — I10 Essential (primary) hypertension: Secondary | ICD-10-CM | POA: Insufficient documentation

## 2023-11-30 DIAGNOSIS — E782 Mixed hyperlipidemia: Secondary | ICD-10-CM | POA: Insufficient documentation

## 2023-11-30 DIAGNOSIS — E1169 Type 2 diabetes mellitus with other specified complication: Secondary | ICD-10-CM | POA: Insufficient documentation

## 2023-11-30 DIAGNOSIS — Z789 Other specified health status: Secondary | ICD-10-CM | POA: Insufficient documentation

## 2023-12-04 ENCOUNTER — Ambulatory Visit: Payer: Self-pay | Admitting: Family Medicine

## 2023-12-04 NOTE — Progress Notes (Signed)
 Please pull her labs that she brought in for my review and they need to be abstracted.

## 2023-12-05 ENCOUNTER — Encounter: Payer: Self-pay | Admitting: Family Medicine

## 2023-12-05 NOTE — Progress Notes (Signed)
Labs has been abstracted

## 2023-12-08 DIAGNOSIS — Z780 Asymptomatic menopausal state: Secondary | ICD-10-CM | POA: Diagnosis not present

## 2023-12-08 DIAGNOSIS — Z1231 Encounter for screening mammogram for malignant neoplasm of breast: Secondary | ICD-10-CM | POA: Diagnosis not present

## 2023-12-08 LAB — HM DEXA SCAN: HM Dexa Scan: NORMAL

## 2023-12-08 LAB — HM MAMMOGRAPHY

## 2024-01-15 NOTE — Progress Notes (Signed)
 Pharmacy Quality Measure Review  This patient is appearing on a report for being at risk of failing the adherence measure for hypertension (ACEi/ARB) medications this calendar year.   Medication: lisinopril  20 mg Last fill date: 10/22/23 for 90 day supply  Per PCP/patient message from 08/05/23, PCP advised patient to reduce dose to 10 mg. Will collaborate with embedded pharmacist to send in an updated prescription.  Jenkins Graces, PharmD PGY1 Pharmacy Resident [phone]

## 2024-01-30 ENCOUNTER — Other Ambulatory Visit: Payer: Self-pay | Admitting: Pharmacist

## 2024-02-02 MED ORDER — LISINOPRIL 10 MG PO TABS: 10.0000 mg | ORAL_TABLET | Freq: Every day | ORAL | 3 refills | Status: AC

## 2024-02-10 ENCOUNTER — Ambulatory Visit
Admission: RE | Admit: 2024-02-10 | Discharge: 2024-02-10 | Disposition: A | Source: Ambulatory Visit | Attending: Acute Care | Admitting: Acute Care

## 2024-02-10 DIAGNOSIS — R911 Solitary pulmonary nodule: Secondary | ICD-10-CM

## 2024-02-17 ENCOUNTER — Ambulatory Visit: Admitting: Acute Care

## 2024-02-17 ENCOUNTER — Encounter: Payer: Self-pay | Admitting: Emergency Medicine

## 2024-02-17 ENCOUNTER — Telehealth: Payer: Self-pay | Admitting: Acute Care

## 2024-02-17 ENCOUNTER — Encounter: Payer: Self-pay | Admitting: Acute Care

## 2024-02-17 VITALS — BP 129/77 | HR 100 | Temp 97.5°F | Ht 65.5 in | Wt 204.0 lb

## 2024-02-17 DIAGNOSIS — R942 Abnormal results of pulmonary function studies: Secondary | ICD-10-CM

## 2024-02-17 DIAGNOSIS — J449 Chronic obstructive pulmonary disease, unspecified: Secondary | ICD-10-CM

## 2024-02-17 DIAGNOSIS — R9389 Abnormal findings on diagnostic imaging of other specified body structures: Secondary | ICD-10-CM

## 2024-02-17 DIAGNOSIS — R911 Solitary pulmonary nodule: Secondary | ICD-10-CM | POA: Diagnosis not present

## 2024-02-17 DIAGNOSIS — Z87891 Personal history of nicotine dependence: Secondary | ICD-10-CM

## 2024-02-17 DIAGNOSIS — K2960 Other gastritis without bleeding: Secondary | ICD-10-CM | POA: Diagnosis not present

## 2024-02-17 MED ORDER — SUCRALFATE 1 GM/10ML PO SUSP: 1.0000 g | Freq: Two times a day (BID) | ORAL | 0 refills | Status: DC

## 2024-02-17 NOTE — Patient Instructions (Addendum)
 It is good to see you today. We have reviewed your CT Chest. This shows the nodule we have been watching has grown in the 3 months since the biopsy. We discussed the best option moving forward is to repeat the biopsy . I have placed an order for a bronchoscopy with biopsies.  We have discussed the procedure in detail.  We have reviewed the risks and benefits of the procedure. These include bleeding, infection, puncture of the lung, and adverse reaction to anesthesia. You have agreed to proceed with biopsy to evaluate the left upper lobe nodule. Your procedure will be done by Dr. Lamar Chris. You will receive a letter today with date time and information pertaining to the procedure. You will need someone to drive you to the procedure, stay with you during the procedure, and stay with you after the procedure. You will also need someone to stay with you for 24 hours after anesthesia to ensure you have cleared and are doing well. You will follow-up with me 1 week after the procedure to review the results and to ensure you are doing well. Call if you need us  prior to the procedure or if you have any questions at all. I have sent in a prescription for Carafate  . Take 5 cc's twice daily for GI upset. Please contact office for sooner follow up if symptoms do not improve or worsen or seek emergency care

## 2024-02-17 NOTE — Telephone Encounter (Signed)
 Please schedule the following:  Provider performing procedure: Byrum Diagnosis:  growing lung nodule Which side if for nodule / mass?  Right  Procedure:  Navigational bronchoscopy with biopsy  Has patient been spoken to by Provider and given informed consent? Chaney Domino NP on 02/17/2024 Anesthesia:  General Do you need Fluro?  Yes Duration of procedure:  1.0 Date: 03/06/2024 Alternate Date: There are several openings  Time: Would like the second case of the day, the 9 am case with the 7:am arrival time Location:  [facility] End[facility] Does patient have OSA? No DM? Pre Diabetic Or Latex allergy? No Medication Restriction/ Anticoagulate/Antiplatelet: None Pre-op Labs Ordered:determined by Anesthesia Imaging request: Recent CT Chest 01/2024  (If, SuperDimension CT Chest, please have STAT courier sent to ENDO)

## 2024-02-17 NOTE — H&P (View-Only) (Signed)
 "  History of Present Illness Holly Riley is a 68 y.o. female former smoker followed through the lung cancer screening program referred for an abnormal pulmonary lung nodule.     02/17/2024 Discussed the use of AI scribe software for clinical note transcription with the patient, who gave verbal consent to proceed.  Synopsis 68 y.o. female former smoker retired Holly Riley followed by the lung cancer screening program. She was  referred for consult 09/2023 after abnormal chest imaging. PET scan was done as follow up to the abnormal LDCT Chest. PET scan showed low level hypermetabolism within a persistent ground glass right lower lobe nodule, concerning for Stage 1 A adenocarcinoma. We recommended a robotic  assisted navigational bronchoscopy with biopsies . She has a strong belief that when cancer is biopsied, it seeds the rest of the body. She underwent the procedure 11/01/2023. Biopsy of the RLL showed atypical cells. Plan was for a 3 month follow up Ct Chest. She is here today to review results.  History of Present Illness Holly Riley is a 68 year old female with a history of lung nodules who presents for evaluation of a growing pulmonary nodule.  We have  been monitoring a pulmonary nodule located along the right fissure, which has increased in size over the past three months from 12 x 13 mm to 18 x 14 mm. A prior biopsy resulted as  atypical cells, providing inconclusive results, which has been frustrating for the patient as well as the pulmonary team.. She is frustrated with the lack of a definitive diagnosis and is anxious about the implications of the nodule's growth.  Patient is here today with her husband and her daughter who is also an Holly Riley.  We discussed options to include PET scan now and evaluate results before moving forward with a biopsy.  The other option is moving forward with a biopsy now for definitive tissue diagnosis.  The patient states she is not looking forward to undergoing the  procedure again.  We did discuss the option of referring her to radiation oncology to see if they would consider treatment without tissue diagnosis.  We discussed that the downside of doing this is that you never have a confirmation of the diagnosis, and therefore do not know if you may be a candidate for targeted therapies.  Patient's daughter encouraged the patient to move forward with the biopsy now.  If this biopsy also comes back as atypical cells and inconclusive we can consider moving forward with a referral to radiation oncology for consideration of treatment without tissue diagnosis.  She would most likely need a PET scan to evaluate for hypermetabolic activity where they would give consideration of treatment without diagnosis.  [name] has no respiratory symptoms such as coughing or shortness of breath. She has a history of COPD but is currently breathing well. She is concerned about the potential for the nodule to be malignant given its growth pattern.  She experiences significant anxiety related to the uncertainty of her diagnosis, which she believes has contributed to gastrointestinal symptoms, including stomach pain suggestive of an ulcer. She has been taking Nexium for two weeks to manage these symptoms.  She recalls significant discomfort following her previous bronchoscopy, including severe back pain that lasted for two weeks, which she attributes to her history of multiple back surgeries and fusions. She is apprehensive about undergoing another procedure due to these past experiences.     Test Results: CT Chest 02/10/2024 Peripheral ground glass nodule in the  right lower lobe measures 18 x 14 mm, increased from 12 x 13 mm on CT 09/07/2023. Clip adjacent to the nodule. No new pulmonary nodules. No focal consolidation or pulmonary edema. No pleural effusion or pneumothorax. Interval enlargement of right lower lobe ground glass nodule concerning for low-grade adenocarcinoma.  CT  cardiac scoring 11/30/2023 Stable appearance of known ground-glass right lower lobe pulmonary nodule. Aortic Atherosclerosis  Right lower lobe ground-glass nodule with adjacent fiducial marker is again seen  11/01/2023 cytology FINAL MICROSCOPIC DIAGNOSIS:  A. LUNG, RLL, FINE NEEDLE ASPIRATION  BIOPSY:  - Atypical.   COMMENT:   The cell block sections contain lung tissue fragments with  peribronchiolar metaplasia and evidence of pneumocyte hyperplasia with  atypia (atypical adenomatous hyperplasia).   Definite in situ malignancy  is not identified.  Surveillance is suggested.   Nuclear med pet imaging 10/07/2023 Persistent ground-glass nodule in the posterior right lower lobe, 1.4 x 1.5 cm (7/44), SUV max 2.7. No additional abnormal hypermetabolism. 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 Left adrenal adenoma. Aortic atherosclerosis (ICD10-I70.0). Coronary artery calcification.  CT chest lung cancer screening 09/07/2023 Nodule along the right major fissure measures 4.9 mm. Dependent right lower lobe density, favored to be sub solid or mixed attenuation, measures 13.8 mm. This is characterized as solid on the DynaCAD software. Lung-RADS 4A, suspicious. Follow up low-dose chest CT without contrast in 3 months  Dependent right lower lobe 13.8 mm density which could represent atelectasis or a mixed attenuation nodule.    Latest Ref Rng & Units 11/01/2023    6:45 AM 09/23/2023   12:00 AM 11/22/2022    9:12 AM  CBC  WBC 4.0 - 10.5 K/uL 7.9  6.6     6.7   Hemoglobin 12.0 - 15.0 g/dL 84.7  85.1     85.0   Hematocrit 36.0 - 46.0 % 46.1  44     45.1   Platelets 150 - 400 K/uL 259  275     276.0      This result is from an external source.       Latest Ref Rng & Units 11/01/2023    6:45 AM 09/23/2023   12:00 AM 11/22/2022    9:12 AM  BMP  Glucose 70 - 99 mg/dL 832   865   BUN 8 - 23 mg/dL 12    17   Creatinine 9.55 - 1.00 mg/dL 9.00  0.8     9.17   Sodium 135 - 145 mmol/L 143  141     140   Potassium 3.5 - 5.1 mmol/L 4.2  4.1     4.0   Chloride 98 - 111 mmol/L 104   106   CO2 22 - 32 mmol/L 22   26   Calcium  8.9 - 10.3 mg/dL 9.2   9.2      This result is from an external source.    BNP No results found for: BNP  ProBNP No results found for: PROBNP  PFT    Component Value Date/Time   FEV1PRE 2.33 10/19/2023 1044   FEV1POST 2.45 10/19/2023 1044   FVCPRE 2.83 10/19/2023 1044   FVCPOST 2.87 10/19/2023 1044   TLC 5.11 10/19/2023 1044   DLCOUNC 19.92 10/19/2023 1044   PREFEV1FVCRT 82 10/19/2023 1044   PSTFEV1FVCRT 85 10/19/2023 1044    CT CHEST WO CONTRAST Result Date: 02/13/2024 EXAM: CT CHEST WITHOUT CONTRAST 02/10/2024 10:58:00 AM TECHNIQUE: CT of  the chest was performed without the administration of intravenous contrast. Multiplanar reformatted images are provided for review. Automated exposure control, iterative reconstruction, and/or weight based adjustment of the mA/kV was utilized to reduce the radiation dose to as low as reasonably achievable. COMPARISON: PET CT for 09/30/2023. Lung cancer screening exam 09/07/2023. CLINICAL HISTORY: Lung nodule, > 8mm. No hx of smoker. Hx dm, htn, bronchoscopy. * Tracking Code: BO * FINDINGS: MEDIASTINUM: Heart and pericardium are unremarkable. The central airways are clear. LYMPH NODES: No mediastinal, hilar or axillary lymphadenopathy. LUNGS AND PLEURA: Peripheral ground glass nodule in the right lower lobe measures 18 x 14 mm, increased from 12 x 13 mm on CT 09/07/2023. Clip adjacent to the nodule. No new pulmonary nodules. No focal consolidation or pulmonary edema. No pleural effusion or pneumothorax. SOFT TISSUES/BONES: No acute abnormality of the bones or soft tissues. UPPER ABDOMEN: Limited images of the upper abdomen demonstrates no acute abnormality. IMPRESSION: 1. Interval enlargement of right lower lobe ground glass nodule  concerning for low-grade adenocarcinoma. Electronically signed by: Norleen Boxer MD 02/13/2024 04:57 PM EST RP Workstation: [id number]     Past medical hx Past Medical History:  Diagnosis Date   Allergy    seasonal and cats   Arthritis    Cataract    Diabetes mellitus without complication (HCC)    HTN (hypertension)    Hyperlipidemia    Hypothyroidism    Spinal stenosis    NS - early, MRI   Thyroid  disease      Social History[1]  Ms.Lorentz reports that she quit smoking about 14 years ago. Her smoking use included cigarettes. She started smoking about 55 years ago. She has a 30 pack-year smoking history. She has been exposed to tobacco smoke. She has never used smokeless tobacco. She reports that she does not drink alcohol and does not use drugs.  Tobacco Cessation: Counseling given: Not Answered Former smoker quit 2011 with a 30-pack-year smoking history  Past surgical hx, Family hx, Social hx all reviewed.  Current Outpatient Medications on File Prior to Visit  Medication Sig   Cholecalciferol (VITAMIN D3) 50 MCG (2000 UT) capsule Take 2,000 Units by mouth daily.    Coenzyme Q10 (CO Q 10 PO)    Flaxseed, Linseed, (FLAX SEED OIL PO) Take 1 tablet by mouth daily.   Garlic 10 MG CAPS Take 10 mg by mouth daily.   Krill Oil 500 MG CAPS    levothyroxine  (SYNTHROID ) 88 MCG tablet Take 1 tablet (88 mcg total) by mouth daily before breakfast.   lisinopril  (ZESTRIL ) 10 MG tablet Take 1 tablet (10 mg total) by mouth daily.   loratadine (CLARITIN) 10 MG tablet Take 10 mg by mouth daily.   Methylcobalamin (B12) 5000 MCG SUBL    Omega-3 Fatty Acids (FISH OIL CONCENTRATE) 1000 MG CAPS Take 1,000 mg by mouth daily.   Red Yeast Rice 600 MG TABS    No current facility-administered medications on file prior to visit.     Allergies[2]  Review Of Systems:  Constitutional:   No  weight loss, night sweats,  Fevers, chills, fatigue, or  lassitude.  HEENT:   No headaches,  Difficulty  swallowing,  Tooth/dental problems, or  Sore throat,                No sneezing, itching, ear ache, nasal congestion, post nasal drip,   CV:  No chest pain,  Orthopnea, PND, swelling in lower extremities, anasarca, dizziness, palpitations, syncope.   GI  No heartburn,  indigestion, abdominal pain, nausea, vomiting, diarrhea, change in bowel habits, loss of appetite, bloody stools.   Resp: No shortness of breath with exertion or at rest.  No excess mucus, no productive cough,  No non-productive cough,  No coughing up of blood.  No change in color of mucus.  No wheezing.  No chest wall deformity  Skin: no rash or lesions.  GU: no dysuria, change in color of urine, no urgency or frequency.  No flank pain, no hematuria   MS:  No joint pain or swelling.  No decreased range of motion.  No back pain.  Psych:  No change in mood or affect. No depression or anxiety.  No memory loss.   Vital Signs BP 129/77   Pulse 100   Temp (!) 97.5 F (36.4 C) (Oral)   Ht 5' 5.5 (1.664 m)   Wt 204 lb (92.5 kg)   SpO2 98%   BMI 33.43 kg/m    Physical Exam: Physical Exam GENERAL: No distress, alert and oriented times 3. EARS NOSE THROAT: No sinus tenderness, tympanic membranes clear, pale nasal mucosa, no oral exudate, no post nasal drip, no lymphadenopathy. CHEST: No wheeze, rales, dullness, no accessory muscle use, no nasal flaring, no sternal retractions. CARDIAC: S1, S2, regular rate and rhythm, no murmur. ABDOMINAL: Soft, non tender. ND, BS present, EXTREMITIES: No clubbing, cyanosis, edema. No obvious deformities NEUROLOGICAL: Normal strength. Alert and oriented x 3, MAE x 4 SKIN: No rashes, warm and dry. No obvious skin lesions PSYCHIATRIC: Normal mood and behavior.   Assessment/Plan  Assessment and Plan Assessment & Plan Solitary pulmonary nodule Nodule size increase over 74-month period of time is concerning Previous biopsy resulting in atypical cells.  Inconclusive result requiring  continued surveillance Options for treatment if malignancy discussed include: repeat biopsy, PET scan, SBRT, surgery.  Patient prefers repeat biopsy. - Scheduled repeat bronchoscopy for biopsy on December 6th at 9:00 AM. - Consider SBRT referral if biopsy inconclusive.  Reflux gastritis Symptoms suggest reflux gastritis, possibly anxiety-related. Currently on Nexium. - Prescribed Carafate suspension, 2 grams BID. - Continue Nexium as needed.    I spent 40 minutes dedicated to the care of this patient on the date of this encounter to include pre-visit review of records, face-to-face time with the patient discussing conditions above, post visit ordering of testing, clinical documentation with the electronic health record, making appropriate referrals as documented, and communicating necessary information to the patient's healthcare team.    Lauraine JULIANNA Lites, NP 02/17/2024  10:25 AM             [1]  Social History Tobacco Use   Smoking status: Former    Current packs/day: 0.00    Average packs/day: 0.8 packs/day for 40.0 years (30.0 ttl pk-yrs)    Types: Cigarettes    Start date: 03/01/1969    Quit date: 03/01/2009    Years since quitting: 14.9    Passive exposure: Past   Smokeless tobacco: Never  Vaping Use   Vaping status: Never Used  Substance Use Topics   Alcohol use: No   Drug use: No  [2]  Allergies Allergen Reactions   Hydrocodone-Acetaminophen  Itching    Other Reaction(s): Unknown   Morphine     Other Reaction(s): Other, Unknown  hallucinations, hallucinations, hallucinations    Other Reaction(s): Unknown    hallucinations hallucinations hallucinations  hallucinations, hallucinations, hallucinations  Other Reaction(s): Unknown  hallucinations hallucinations hallucinations    hallucinations hallucinations hallucinations   Morphine And Codeine  Other (See Comments)  hallucinations hallucinations   Oxycodone -Acetaminophen  Itching    Other Reaction(s):  Other, Unknown  tachycardia    Other Reaction(s): Unknown  tachycardia  Other Reaction(s): Unknown    tachycardia   Ezetimibe  Other (See Comments)    myalgias    Hydromorphone     hallucentations  Other Reaction(s): Hallucinations, Unknown  Other Reaction(s): Unknown    hallucentations  Other Reaction(s): Unknown  hallucentations    hallucentations   Statins Other (See Comments)    myalgis   Flonase  [Fluticasone ] Other (See Comments)    Causes nose bleeds   Percocet [Oxycodone -Acetaminophen ] Itching and Other (See Comments)    tachycardia   Singulair  [Montelukast ] Other (See Comments)    Insomnia    Vicodin [Hydrocodone-Acetaminophen ]    "

## 2024-02-17 NOTE — Telephone Encounter (Signed)
 Letter given to Holly Riley. Case #[id number]. Sending to [employer] to [facility]

## 2024-02-17 NOTE — Progress Notes (Signed)
 "  History of Present Illness Holly Riley is a 68 y.o. female former smoker followed through the lung cancer screening program referred for an abnormal pulmonary lung nodule.     02/17/2024 Discussed the use of AI scribe software for clinical note transcription with the patient, who gave verbal consent to proceed.  Synopsis 68 y.o. female former smoker retired Holly Riley followed by the lung cancer screening program. She was  referred for consult 09/2023 after abnormal chest imaging. PET scan was done as follow up to the abnormal LDCT Chest. PET scan showed low level hypermetabolism within a persistent ground glass right lower lobe nodule, concerning for Stage 1 A adenocarcinoma. We recommended a robotic  assisted navigational bronchoscopy with biopsies . She has a strong belief that when cancer is biopsied, it seeds the rest of the body. She underwent the procedure 11/01/2023. Biopsy of the RLL showed atypical cells. Plan was for a 3 month follow up Ct Chest. She is here today to review results.  History of Present Illness Holly Riley is a 68 year old female with a history of lung nodules who presents for evaluation of a growing pulmonary nodule.  We have  been monitoring a pulmonary nodule located along the right fissure, which has increased in size over the past three months from 12 x 13 mm to 18 x 14 mm. A prior biopsy resulted as  atypical cells, providing inconclusive results, which has been frustrating for the patient as well as the pulmonary team.. She is frustrated with the lack of a definitive diagnosis and is anxious about the implications of the nodule's growth.  Patient is here today with her husband and her daughter who is also an Holly Riley.  We discussed options to include PET scan now and evaluate results before moving forward with a biopsy.  The other option is moving forward with a biopsy now for definitive tissue diagnosis.  The patient states she is not looking forward to undergoing the  procedure again.  We did discuss the option of referring her to radiation oncology to see if they would consider treatment without tissue diagnosis.  We discussed that the downside of doing this is that you never have a confirmation of the diagnosis, and therefore do not know if you may be a candidate for targeted therapies.  Patient's daughter encouraged the patient to move forward with the biopsy now.  If this biopsy also comes back as atypical cells and inconclusive we can consider moving forward with a referral to radiation oncology for consideration of treatment without tissue diagnosis.  She would most likely need a PET scan to evaluate for hypermetabolic activity where they would give consideration of treatment without diagnosis.  Holly Riley has no respiratory symptoms such as coughing or shortness of breath. She has a history of COPD but is currently breathing well. She is concerned about the potential for the nodule to be malignant given its growth pattern.  She experiences significant anxiety related to the uncertainty of her diagnosis, which she believes has contributed to gastrointestinal symptoms, including stomach pain suggestive of an ulcer. She has been taking Nexium for two weeks to manage these symptoms.  She recalls significant discomfort following her previous bronchoscopy, including severe back pain that lasted for two weeks, which she attributes to her history of multiple back surgeries and fusions. She is apprehensive about undergoing another procedure due to these past experiences.     Test Results: CT Chest 02/10/2024 Peripheral ground glass nodule in the  right lower lobe measures 18 x 14 mm, increased from 12 x 13 mm on CT 09/07/2023. Clip adjacent to the nodule. No new pulmonary nodules. No focal consolidation or pulmonary edema. No pleural effusion or pneumothorax. Interval enlargement of right lower lobe ground glass nodule concerning for low-grade adenocarcinoma.  CT  cardiac scoring 11/30/2023 Stable appearance of known ground-glass right lower lobe pulmonary nodule. Aortic Atherosclerosis  Right lower lobe ground-glass nodule with adjacent fiducial marker is again seen  11/01/2023 cytology FINAL MICROSCOPIC DIAGNOSIS:  A. LUNG, RLL, FINE NEEDLE ASPIRATION  BIOPSY:  - Atypical.   COMMENT:   The cell block sections contain lung tissue fragments with  peribronchiolar metaplasia and evidence of pneumocyte hyperplasia with  atypia (atypical adenomatous hyperplasia).   Definite in situ malignancy  is not identified.  Surveillance is suggested.   Nuclear med pet imaging 10/07/2023 Persistent ground-glass nodule in the posterior right lower lobe, 1.4 x 1.5 cm (7/44), SUV max 2.7. No additional abnormal hypermetabolism. 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 Left adrenal adenoma. Aortic atherosclerosis (ICD10-I70.0). Coronary artery calcification.  CT chest lung cancer screening 09/07/2023 Nodule along the right major fissure measures 4.9 mm. Dependent right lower lobe density, favored to be sub solid or mixed attenuation, measures 13.8 mm. This is characterized as solid on the DynaCAD software. Lung-RADS 4A, suspicious. Follow up low-dose chest CT without contrast in 3 months  Dependent right lower lobe 13.8 mm density which could represent atelectasis or a mixed attenuation nodule.    Latest Ref Rng & Units 11/01/2023    6:45 AM 09/23/2023   12:00 AM 11/22/2022    9:12 AM  CBC  WBC 4.0 - 10.5 K/uL 7.9  6.6     6.7   Hemoglobin 12.0 - 15.0 g/dL 84.7  85.1     85.0   Hematocrit 36.0 - 46.0 % 46.1  44     45.1   Platelets 150 - 400 K/uL 259  275     276.0      This result is from an external source.       Latest Ref Rng & Units 11/01/2023    6:45 AM 09/23/2023   12:00 AM 11/22/2022    9:12 AM  BMP  Glucose 70 - 99 mg/dL 832   865   BUN 8 - 23 mg/dL 12    17   Creatinine 9.55 - 1.00 mg/dL 9.00  0.8     9.17   Sodium 135 - 145 mmol/L 143  141     140   Potassium 3.5 - 5.1 mmol/L 4.2  4.1     4.0   Chloride 98 - 111 mmol/L 104   106   CO2 22 - 32 mmol/L 22   26   Calcium  8.9 - 10.3 mg/dL 9.2   9.2      This result is from an external source.    BNP No results found for: BNP  ProBNP No results found for: PROBNP  PFT    Component Value Date/Time   FEV1PRE 2.33 10/19/2023 1044   FEV1POST 2.45 10/19/2023 1044   FVCPRE 2.83 10/19/2023 1044   FVCPOST 2.87 10/19/2023 1044   TLC 5.11 10/19/2023 1044   DLCOUNC 19.92 10/19/2023 1044   PREFEV1FVCRT 82 10/19/2023 1044   PSTFEV1FVCRT 85 10/19/2023 1044    CT CHEST WO CONTRAST Result Date: 02/13/2024 EXAM: CT CHEST WITHOUT CONTRAST 02/10/2024 10:58:00 AM TECHNIQUE: CT of  the chest was performed without the administration of intravenous contrast. Multiplanar reformatted images are provided for review. Automated exposure control, iterative reconstruction, and/or weight based adjustment of the mA/kV was utilized to reduce the radiation dose to as low as reasonably achievable. COMPARISON: PET CT for 09/30/2023. Lung cancer screening exam 09/07/2023. CLINICAL HISTORY: Lung nodule, > 8mm. No hx of smoker. Hx dm, htn, bronchoscopy. * Tracking Code: BO * FINDINGS: MEDIASTINUM: Heart and pericardium are unremarkable. The central airways are clear. LYMPH NODES: No mediastinal, hilar or axillary lymphadenopathy. LUNGS AND PLEURA: Peripheral ground glass nodule in the right lower lobe measures 18 x 14 mm, increased from 12 x 13 mm on CT 09/07/2023. Clip adjacent to the nodule. No new pulmonary nodules. No focal consolidation or pulmonary edema. No pleural effusion or pneumothorax. SOFT TISSUES/BONES: No acute abnormality of the bones or soft tissues. UPPER ABDOMEN: Limited images of the upper abdomen demonstrates no acute abnormality. IMPRESSION: 1. Interval enlargement of right lower lobe ground glass nodule  concerning for low-grade adenocarcinoma. Electronically signed by: Norleen Boxer MD 02/13/2024 04:57 PM EST RP Workstation: [id number]     Past medical hx Past Medical History:  Diagnosis Date   Allergy    seasonal and cats   Arthritis    Cataract    Diabetes mellitus without complication (HCC)    HTN (hypertension)    Hyperlipidemia    Hypothyroidism    Spinal stenosis    NS - early, MRI   Thyroid  disease      Social History[1]  Holly Riley reports that she quit smoking about 14 years ago. Her smoking use included cigarettes. She started smoking about 55 years ago. She has a 30 pack-year smoking history. She has been exposed to tobacco smoke. She has never used smokeless tobacco. She reports that she does not drink alcohol and does not use drugs.  Tobacco Cessation: Counseling given: Not Answered Former smoker quit 2011 with a 30-pack-year smoking history  Past surgical hx, Family hx, Social hx all reviewed.  Current Outpatient Medications on File Prior to Visit  Medication Sig   Cholecalciferol (VITAMIN D3) 50 MCG (2000 UT) capsule Take 2,000 Units by mouth daily.    Coenzyme Q10 (CO Q 10 PO)    Flaxseed, Linseed, (FLAX SEED OIL PO) Take 1 tablet by mouth daily.   Garlic 10 MG CAPS Take 10 mg by mouth daily.   Krill Oil 500 MG CAPS    levothyroxine  (SYNTHROID ) 88 MCG tablet Take 1 tablet (88 mcg total) by mouth daily before breakfast.   lisinopril  (ZESTRIL ) 10 MG tablet Take 1 tablet (10 mg total) by mouth daily.   loratadine (CLARITIN) 10 MG tablet Take 10 mg by mouth daily.   Methylcobalamin (B12) 5000 MCG SUBL    Omega-3 Fatty Acids (FISH OIL CONCENTRATE) 1000 MG CAPS Take 1,000 mg by mouth daily.   Red Yeast Rice 600 MG TABS    No current facility-administered medications on file prior to visit.     Allergies[2]  Review Of Systems:  Constitutional:   No  weight loss, night sweats,  Fevers, chills, fatigue, or  lassitude.  HEENT:   No headaches,  Difficulty  swallowing,  Tooth/dental problems, or  Sore throat,                No sneezing, itching, ear ache, nasal congestion, post nasal drip,   CV:  No chest pain,  Orthopnea, PND, swelling in lower extremities, anasarca, dizziness, palpitations, syncope.   GI  No heartburn,  indigestion, abdominal pain, nausea, vomiting, diarrhea, change in bowel habits, loss of appetite, bloody stools.   Resp: No shortness of breath with exertion or at rest.  No excess mucus, no productive cough,  No non-productive cough,  No coughing up of blood.  No change in color of mucus.  No wheezing.  No chest wall deformity  Skin: no rash or lesions.  GU: no dysuria, change in color of urine, no urgency or frequency.  No flank pain, no hematuria   MS:  No joint pain or swelling.  No decreased range of motion.  No back pain.  Psych:  No change in mood or affect. No depression or anxiety.  No memory loss.   Vital Signs BP 129/77   Pulse 100   Temp (!) 97.5 F (36.4 C) (Oral)   Ht 5' 5.5 (1.664 m)   Wt 204 lb (92.5 kg)   SpO2 98%   BMI 33.43 kg/m    Physical Exam: Physical Exam GENERAL: No distress, alert and oriented times 3. EARS NOSE THROAT: No sinus tenderness, tympanic membranes clear, pale nasal mucosa, no oral exudate, no post nasal drip, no lymphadenopathy. CHEST: No wheeze, rales, dullness, no accessory muscle use, no nasal flaring, no sternal retractions. CARDIAC: S1, S2, regular rate and rhythm, no murmur. ABDOMINAL: Soft, non tender. ND, BS present, EXTREMITIES: No clubbing, cyanosis, edema. No obvious deformities NEUROLOGICAL: Normal strength. Alert and oriented x 3, MAE x 4 SKIN: No rashes, warm and dry. No obvious skin lesions PSYCHIATRIC: Normal mood and behavior.   Assessment/Plan  Assessment and Plan Assessment & Plan Solitary pulmonary nodule Nodule size increase over 74-month period of time is concerning Previous biopsy resulting in atypical cells.  Inconclusive result requiring  continued surveillance Options for treatment if malignancy discussed include: repeat biopsy, PET scan, SBRT, surgery.  Patient prefers repeat biopsy. - Scheduled repeat bronchoscopy for biopsy on December 6th at 9:00 AM. - Consider SBRT referral if biopsy inconclusive.  Reflux gastritis Symptoms suggest reflux gastritis, possibly anxiety-related. Currently on Nexium. - Prescribed Carafate suspension, 2 grams BID. - Continue Nexium as needed.    I spent 40 minutes dedicated to the care of this patient on the date of this encounter to include pre-visit review of records, face-to-face time with the patient discussing conditions above, post visit ordering of testing, clinical documentation with the electronic health record, making appropriate referrals as documented, and communicating necessary information to the patient's healthcare team.    Lauraine JULIANNA Lites, NP 02/17/2024  10:25 AM             [1]  Social History Tobacco Use   Smoking status: Former    Current packs/day: 0.00    Average packs/day: 0.8 packs/day for 40.0 years (30.0 ttl pk-yrs)    Types: Cigarettes    Start date: 03/01/1969    Quit date: 03/01/2009    Years since quitting: 14.9    Passive exposure: Past   Smokeless tobacco: Never  Vaping Use   Vaping status: Never Used  Substance Use Topics   Alcohol use: No   Drug use: No  [2]  Allergies Allergen Reactions   Hydrocodone-Acetaminophen  Itching    Other Reaction(s): Unknown   Morphine     Other Reaction(s): Other, Unknown  hallucinations, hallucinations, hallucinations    Other Reaction(s): Unknown    hallucinations hallucinations hallucinations  hallucinations, hallucinations, hallucinations  Other Reaction(s): Unknown  hallucinations hallucinations hallucinations    hallucinations hallucinations hallucinations   Morphine And Codeine  Other (See Comments)  hallucinations hallucinations   Oxycodone -Acetaminophen  Itching    Other Reaction(s):  Other, Unknown  tachycardia    Other Reaction(s): Unknown  tachycardia  Other Reaction(s): Unknown    tachycardia   Ezetimibe  Other (See Comments)    myalgias    Hydromorphone     hallucentations  Other Reaction(s): Hallucinations, Unknown  Other Reaction(s): Unknown    hallucentations  Other Reaction(s): Unknown  hallucentations    hallucentations   Statins Other (See Comments)    myalgis   Flonase  [Fluticasone ] Other (See Comments)    Causes nose bleeds   Percocet [Oxycodone -Acetaminophen ] Itching and Other (See Comments)    tachycardia   Singulair  [Montelukast ] Other (See Comments)    Insomnia    Vicodin [Hydrocodone-Acetaminophen ]    "

## 2024-02-28 ENCOUNTER — Other Ambulatory Visit: Payer: Self-pay

## 2024-02-28 ENCOUNTER — Encounter (HOSPITAL_COMMUNITY): Payer: Self-pay | Admitting: Emergency Medicine

## 2024-02-28 NOTE — Progress Notes (Addendum)
 SDW CALL  Patient was given pre-op instructions over the phone. The opportunity was given for the patient to ask questions. No further questions asked. Patient verbalized understanding of instructions given.   PCP - Lavern Heck Cardiologist - denies  PPM/ICD - denies   Chest x-ray - 11/01/23 EKG - 11/01/23 Stress Test - denies ECHO - denies Cardiac Cath - denies  Sleep Study - denies CPAP - n/a  Patient states that she does not check blood sugars at home and that she does not have diabetes  Last dose of GLP1 agonist-  n/a GLP1 instructions:  n/a  Blood Thinner Instructions: n/a Aspirin Instructions: n/a  ERAS Protcol - NPO PRE-SURGERY Ensure or G2- n/a  COVID TEST- n/a   Anesthesia review: yes - EKG  Patient states that she had a tooth extracted on 12/22 and completed antibiotics on 12/29.  Patient reports no complications  Patient denies shortness of breath, fever, cough and chest pain over the phone call   All instructions explained to the patient, with a verbal understanding of the material. Patient agrees to go over the instructions while at home for a better understanding.

## 2024-03-06 ENCOUNTER — Ambulatory Visit (HOSPITAL_COMMUNITY)
Admission: RE | Admit: 2024-03-06 | Discharge: 2024-03-06 | Disposition: A | Discharge status: Home / Self Care | Attending: Emergency Medicine | Admitting: Emergency Medicine

## 2024-03-06 ENCOUNTER — Other Ambulatory Visit: Payer: Self-pay

## 2024-03-06 ENCOUNTER — Encounter (HOSPITAL_COMMUNITY): Payer: Self-pay | Admitting: Emergency Medicine

## 2024-03-06 ENCOUNTER — Ambulatory Visit (HOSPITAL_COMMUNITY): Payer: Self-pay | Admitting: Physician Assistant

## 2024-03-06 ENCOUNTER — Ambulatory Visit (HOSPITAL_COMMUNITY)

## 2024-03-06 ENCOUNTER — Encounter (HOSPITAL_COMMUNITY): Payer: Self-pay | Admitting: Physician Assistant

## 2024-03-06 ENCOUNTER — Encounter (HOSPITAL_COMMUNITY)
Admission: RE | Disposition: A | Discharge status: Home / Self Care | Payer: Self-pay | Source: Home / Self Care | Attending: Emergency Medicine

## 2024-03-06 DIAGNOSIS — R911 Solitary pulmonary nodule: Secondary | ICD-10-CM

## 2024-03-06 DIAGNOSIS — K219 Gastro-esophageal reflux disease without esophagitis: Secondary | ICD-10-CM | POA: Diagnosis not present

## 2024-03-06 DIAGNOSIS — E039 Hypothyroidism, unspecified: Secondary | ICD-10-CM | POA: Diagnosis not present

## 2024-03-06 DIAGNOSIS — F419 Anxiety disorder, unspecified: Secondary | ICD-10-CM | POA: Insufficient documentation

## 2024-03-06 DIAGNOSIS — M1990 Unspecified osteoarthritis, unspecified site: Secondary | ICD-10-CM | POA: Insufficient documentation

## 2024-03-06 DIAGNOSIS — Z79899 Other long term (current) drug therapy: Secondary | ICD-10-CM | POA: Diagnosis not present

## 2024-03-06 DIAGNOSIS — E119 Type 2 diabetes mellitus without complications: Secondary | ICD-10-CM | POA: Diagnosis not present

## 2024-03-06 DIAGNOSIS — J449 Chronic obstructive pulmonary disease, unspecified: Secondary | ICD-10-CM | POA: Insufficient documentation

## 2024-03-06 DIAGNOSIS — Z981 Arthrodesis status: Secondary | ICD-10-CM | POA: Diagnosis not present

## 2024-03-06 DIAGNOSIS — E669 Obesity, unspecified: Secondary | ICD-10-CM | POA: Insufficient documentation

## 2024-03-06 DIAGNOSIS — Z87891 Personal history of nicotine dependence: Secondary | ICD-10-CM | POA: Diagnosis not present

## 2024-03-06 DIAGNOSIS — Z6831 Body mass index (BMI) 31.0-31.9, adult: Secondary | ICD-10-CM | POA: Diagnosis not present

## 2024-03-06 DIAGNOSIS — I10 Essential (primary) hypertension: Secondary | ICD-10-CM | POA: Diagnosis not present

## 2024-03-06 HISTORY — PX: BRONCHIAL BIOPSY: SHX5109

## 2024-03-06 HISTORY — PX: BRONCHIAL NEEDLE ASPIRATION BIOPSY: SHX5106

## 2024-03-06 HISTORY — PX: BRONCHIAL WASHINGS: SHX5105

## 2024-03-06 HISTORY — DX: Arthrodesis status: Z98.1

## 2024-03-06 HISTORY — PX: BRONCHIAL BRUSHINGS: SHX5108

## 2024-03-06 HISTORY — PX: VIDEO BRONCHOSCOPY WITH ENDOBRONCHIAL NAVIGATION: SHX6175

## 2024-03-06 LAB — BASIC METABOLIC PANEL WITH GFR
Anion gap: 12 (ref 5–15)
BUN: 13 mg/dL (ref 8–23)
CO2: 22 mmol/L (ref 22–32)
Calcium: 9.3 mg/dL (ref 8.9–10.3)
Chloride: 106 mmol/L (ref 98–111)
Creatinine, Ser: 0.95 mg/dL (ref 0.44–1.00)
GFR, Estimated: >60 mL/min
Glucose, Bld: 156 mg/dL — ABNORMAL HIGH (ref 70–99)
Potassium: 3.8 mmol/L (ref 3.5–5.1)
Sodium: 139 mmol/L (ref 135–145)

## 2024-03-06 LAB — CBC
HCT: 44.9 % (ref 36.0–46.0)
Hemoglobin: 14.9 g/dL (ref 12.0–15.0)
MCH: 32 pg (ref 26.0–34.0)
MCHC: 33.2 g/dL (ref 30.0–36.0)
MCV: 96.6 fL (ref 80.0–100.0)
Platelets: 248 K/uL (ref 150–400)
RBC: 4.65 MIL/uL (ref 3.87–5.11)
RDW: 12.6 % (ref 11.5–15.5)
WBC: 7.5 K/uL (ref 4.0–10.5)
nRBC: 0 % (ref 0.0–0.2)

## 2024-03-06 LAB — GLUCOSE, CAPILLARY
Glucose-Capillary: 171 mg/dL — ABNORMAL HIGH (ref 70–99)
Glucose-Capillary: 143 mg/dL — ABNORMAL HIGH (ref 70–99)

## 2024-03-06 MED ORDER — ROCURONIUM BROMIDE 10 MG/ML (PF) SYRINGE
PREFILLED_SYRINGE | INTRAVENOUS | Status: DC | PRN
  Administered 2024-03-06: 10 mg via INTRAVENOUS
  Administered 2024-03-06: 50 mg via INTRAVENOUS
  Administered 2024-03-06: 10 mg via INTRAVENOUS

## 2024-03-06 MED ORDER — PROMETHAZINE-DM 6.25-15 MG/5ML PO SYRP: 5.0000 mL | ORAL_SOLUTION | Freq: Four times a day (QID) | ORAL | 0 refills | Status: AC | PRN

## 2024-03-06 MED ORDER — ONDANSETRON HCL 4 MG/2ML IJ SOLN
INTRAMUSCULAR | Status: DC | PRN
  Administered 2024-03-06: 4 mg via INTRAVENOUS

## 2024-03-06 MED ORDER — CHLORHEXIDINE GLUCONATE 0.12 % MT SOLN
OROMUCOSAL | Status: AC
  Administered 2024-03-06: 15 mL via OROMUCOSAL
  Filled 2024-03-06: qty 15

## 2024-03-06 MED ORDER — CHLORHEXIDINE GLUCONATE 0.12 % MT SOLN: 15.0000 mL | Freq: Once | OROMUCOSAL | Status: AC

## 2024-03-06 MED ORDER — LIDOCAINE 2% (20 MG/ML) 5 ML SYRINGE
INTRAMUSCULAR | Status: DC | PRN
  Administered 2024-03-06: 80 mg via INTRAVENOUS

## 2024-03-06 MED ORDER — PROPOFOL 10 MG/ML IV BOLUS
INTRAVENOUS | Status: DC | PRN
  Administered 2024-03-06: 140 mg via INTRAVENOUS

## 2024-03-06 MED ORDER — LACTATED RINGERS IV SOLN: INTRAVENOUS | Status: DC

## 2024-03-06 MED ORDER — SUGAMMADEX SODIUM 200 MG/2ML IV SOLN
INTRAVENOUS | Status: DC | PRN
  Administered 2024-03-06: 200 mg via INTRAVENOUS

## 2024-03-06 MED ORDER — PROPOFOL 500 MG/50ML IV EMUL
INTRAVENOUS | Status: DC | PRN
  Administered 2024-03-06: 150 ug/kg/min via INTRAVENOUS

## 2024-03-06 NOTE — Transfer of Care (Signed)
 Immediate Anesthesia Transfer of Care Note  Patient: Holly Riley  Procedure(s) Performed: VIDEO BRONCHOSCOPY WITH ENDOBRONCHIAL NAVIGATION (Right) BRONCHOSCOPY, WITH NEEDLE ASPIRATION BIOPSY BRONCHOSCOPY, WITH BIOPSY BRONCHOSCOPY, WITH BRUSH BIOPSY IRRIGATION, BRONCHUS  Patient Location: PACU and Endoscopy Unit  Anesthesia Type:General  Level of Consciousness: awake, alert , and oriented  Airway & Oxygen Therapy: Patient Spontanous Breathing and Patient connected to nasal cannula oxygen  Post-op Assessment: Report given to RN and Post -op Vital signs reviewed and stable  Post vital signs: Reviewed and stable  Last Vitals:  Vitals Value Taken Time  BP 120/54 03/06/24 10:50  Temp 36.3 C 03/06/24 10:43  Pulse 75 03/06/24 10:59  Resp 13 03/06/24 10:59  SpO2 99 % 03/06/24 10:59  Vitals shown include unfiled device data.  Last Pain:  Vitals:   03/06/24 1050  TempSrc:   PainSc: 0-No pain         Complications: No notable events documented.

## 2024-03-06 NOTE — Anesthesia Procedure Notes (Signed)
 Procedure Name: Intubation Date/Time: 03/06/2024 9:16 AM  Performed by: Corinne Garnette BRAVO, MDPre-anesthesia Checklist: Patient identified, Emergency Drugs available, Suction available and Patient being monitored Patient Re-evaluated:Patient Re-evaluated prior to induction Oxygen Delivery Method: Circle system utilized Preoxygenation: Pre-oxygenation with 100% oxygen Induction Type: IV induction Ventilation: Mask ventilation without difficulty Laryngoscope Size: Glidescope and 3 Grade View: Grade II Tube type: Oral Tube size: 8.5 mm Number of attempts: 1 Airway Equipment and Method: Stylet and Oral airway Placement Confirmation: ETT inserted through vocal cords under direct vision, positive ETCO2 and breath sounds checked- equal and bilateral Secured at: 22 cm Tube secured with: Tape Dental Injury: Teeth and Oropharynx as per pre-operative assessment  Difficulty Due To: Difficult Airway- due to reduced neck mobility Future Recommendations: Recommend- induction with short-acting agent, and alternative techniques readily available

## 2024-03-06 NOTE — Discharge Instructions (Signed)

## 2024-03-06 NOTE — Op Note (Signed)
 Video Bronchoscopy with Robotic Assisted Bronchoscopic Navigation   Date of Operation: 03/06/2024   Pre-op Diagnosis: Right lower lobe ground glass pulmonary nodule  Post-op Diagnosis: Same  Surgeon: Lamar Chris  Assistants: None  Anesthesia: General endotracheal anesthesia  Operation: Flexible video fiberoptic bronchoscopy with robotic assistance and biopsies.  Estimated Blood Loss: Minimal  Complications: None  Indications and History: Holly Riley is a 69 y.o. female with history of former tobacco use.  She has a an isolated right lower lobe ground glass pulmonary nodule detected on lung cancer screening.  Biopsy was nondiagnostic 10/2023.  The nodule has increased in size and she returns for further evaluation.  Recommendation made to achieve a tissue diagnosis via robotic assisted navigational bronchoscopy.  The risks, benefits, complications, treatment options and expected outcomes were discussed with the patient.  The possibilities of pneumothorax, pneumonia, reaction to medication, pulmonary aspiration, perforation of a viscus, bleeding, failure to diagnose a condition and creating a complication requiring transfusion or operation were discussed with the patient who freely signed the consent.    Description of Procedure: The patient was seen in the Preoperative Area, was examined and was deemed appropriate to proceed.  The patient was taken to [facility] Endoscopy room 3, identified as Holly Riley and the procedure verified as Flexible Video Fiberoptic Bronchoscopy.  A Time Out was held and the above information confirmed.   Prior to the date of the procedure a high-resolution CT scan of the chest was performed. Utilizing ION software program a virtual tracheobronchial tree was generated to allow the creation of distinct navigation pathways to the patient's parenchymal abnormalities. After being taken to the operating room general anesthesia was initiated and the patient  was orally  intubated. The video fiberoptic bronchoscope was introduced via the endotracheal tube and a general inspection was performed which showed normal right and left lung anatomy. Aspiration of the bilateral mainstems was completed to remove any remaining secretions. Robotic catheter inserted into patient's endotracheal tube.   Target #1 right lower lobe ground glass pulmonary nodule: The distinct navigation pathways prepared prior to this procedure were then utilized to navigate to patient's lesion identified on CT scan. The robotic catheter was secured into place and the vision probe was withdrawn.  Lesion location was approximated using fluoroscopy.  Local registration and targeting was performed using [employer] Cios mobile C-arm three-dimensional imaging. Under fluoroscopic guidance transbronchial brushings, transbronchial needle biopsies, and transbronchial forceps biopsies were performed to be sent for cytology and pathology.  Tool-in-lesion was confirmed using Cios mobile C-arm.  A bronchioalveolar lavage was performed in the right lower lobe adjacent to the nodule and sent for cytology.    At the end of the procedure a general airway inspection was performed and there was no evidence of active bleeding. The bronchoscope was removed.  The patient tolerated the procedure well. There was no significant blood loss and there were no obvious complications. A post-procedural chest x-ray is pending.  Samples Target #1: 1. Transbronchial brushings from right lower lobe nodule 2. Transbronchial Wang needle biopsies from right lower lobe nodule 3. Transbronchial forceps biopsies from right lower lobe nodule 4. Bronchoalveolar lavage from right lower lobe   Plans:  The patient will be discharged from the PACU to home when recovered from anesthesia and after chest x-ray is reviewed. We will review the cytology, pathology and microbiology results with the patient when they become available. Outpatient  followup will be with CANDIE Lites, NP.   Lamar Chris, MD, PhD  03/06/2024, 10:33 AM [facility] [phone] or if no answer before 7:00PM call [phone] For any issues after 7:00PM please call eLink [phone]

## 2024-03-06 NOTE — Anesthesia Postprocedure Evaluation (Signed)
"   Anesthesia Post Note  Patient: Holly Riley  Procedure(s) Performed: VIDEO BRONCHOSCOPY WITH ENDOBRONCHIAL NAVIGATION (Right) BRONCHOSCOPY, WITH NEEDLE ASPIRATION BIOPSY BRONCHOSCOPY, WITH BIOPSY BRONCHOSCOPY, WITH BRUSH BIOPSY IRRIGATION, BRONCHUS     Patient location during evaluation: PACU Anesthesia Type: General Level of consciousness: awake and alert Pain management: pain level controlled Vital Signs Assessment: post-procedure vital signs reviewed and stable Respiratory status: spontaneous breathing, nonlabored ventilation and respiratory function stable Cardiovascular status: blood pressure returned to baseline and stable Postop Assessment: no apparent nausea or vomiting Anesthetic complications: no   No notable events documented.  Last Vitals:  Vitals:   03/06/24 1100 03/06/24 1110  BP: 115/60 120/64  Pulse: 78 71  Resp: 16 13  Temp:    SpO2: 98% 99%    Last Pain:  Vitals:   03/06/24 1050  TempSrc:   PainSc: 0-No pain                 Helios Kohlmann A.      "

## 2024-03-06 NOTE — Anesthesia Preprocedure Evaluation (Addendum)
"                                    Anesthesia Evaluation  Patient identified by MRN, date of birth, ID band Patient awake    Reviewed: Allergy & Precautions, NPO status , Patient's Chart, lab work & pertinent test results  Airway Mallampati: II  TM Distance: >3 FB Neck ROM: Full    Dental  (+) Dental Advisory Given, Missing   Pulmonary former smoker Lung nodule    Pulmonary exam normal breath sounds clear to auscultation       Cardiovascular hypertension, Pt. on medications Normal cardiovascular exam Rhythm:Regular Rate:Normal     Neuro/Psych S/p C5-7 ACDF negative neurological ROS  negative psych ROS   GI/Hepatic Neg liver ROS,GERD  ,,  Endo/Other  diabetes, Type 2Hypothyroidism  Obesity   Renal/GU negative Renal ROS     Musculoskeletal  (+) Arthritis ,    Abdominal   Peds  Hematology negative hematology ROS (+)   Anesthesia Other Findings Day of surgery medications reviewed with the patient.  Reproductive/Obstetrics                              Anesthesia Physical Anesthesia Plan  ASA: 3  Anesthesia Plan: General   Post-op Pain Management: Tylenol  PO (pre-op)*   Induction: Intravenous  PONV Risk Score and Plan: 3 and Dexamethasone  and Ondansetron   Airway Management Planned: Oral ETT and Video Laryngoscope Planned  Additional Equipment:   Intra-op Plan:   Post-operative Plan: Extubation in OR  Informed Consent: I have reviewed the patients History and Physical, chart, labs and discussed the procedure including the risks, benefits and alternatives for the proposed anesthesia with the patient or authorized representative who has indicated his/her understanding and acceptance.     Dental advisory given  Plan Discussed with: CRNA  Anesthesia Plan Comments:          Anesthesia Quick Evaluation  "

## 2024-03-06 NOTE — Interval H&P Note (Signed)
 History and Physical Interval Note:  03/06/2024 7:40 AM  Holly Riley  has presented today for surgery, with the diagnosis of lung nodule.  The various methods of treatment have been discussed with the patient and family. After consideration of risks, benefits and other options for treatment, the patient has consented to  Procedures: VIDEO BRONCHOSCOPY WITH ENDOBRONCHIAL NAVIGATION (Right) as a surgical intervention.  The patient's history has been reviewed, patient examined, no change in status, stable for surgery.  I have reviewed the patient's chart and labs.  Questions were answered to the patient's satisfaction.     Lamar GORMAN Chris

## 2024-03-06 NOTE — Op Note (Signed)
 Procedure Note  Patient: Holly Riley  [employer] Cios mobile C-arm was utilized to identify and biopsy right lower lobe pulmonary nodule.  Tool-in-lesion was confirmed using real-time Cios imaging, and images were uploaded to PACS.        Lamar Chris, MD, PhD 03/06/2024, 10:37 AM [facility] [phone] or if no answer before 7:00PM call [phone] For any issues after 7:00PM please call eLink [phone]

## 2024-03-08 LAB — CYTOLOGY - NON PAP

## 2024-03-09 ENCOUNTER — Other Ambulatory Visit: Payer: Self-pay | Admitting: Acute Care

## 2024-03-09 DIAGNOSIS — K2960 Other gastritis without bleeding: Secondary | ICD-10-CM

## 2024-03-13 ENCOUNTER — Ambulatory Visit: Admitting: Acute Care

## 2024-03-13 ENCOUNTER — Encounter: Payer: Self-pay | Admitting: Acute Care

## 2024-03-13 ENCOUNTER — Telehealth: Payer: Self-pay

## 2024-03-13 VITALS — BP 124/68 | HR 86 | Temp 98.0°F | Ht 65.5 in | Wt 194.2 lb

## 2024-03-13 DIAGNOSIS — R911 Solitary pulmonary nodule: Secondary | ICD-10-CM | POA: Diagnosis not present

## 2024-03-13 DIAGNOSIS — R9389 Abnormal findings on diagnostic imaging of other specified body structures: Secondary | ICD-10-CM

## 2024-03-13 DIAGNOSIS — Z87891 Personal history of nicotine dependence: Secondary | ICD-10-CM | POA: Diagnosis not present

## 2024-03-13 DIAGNOSIS — Z9889 Other specified postprocedural states: Secondary | ICD-10-CM

## 2024-03-13 NOTE — Progress Notes (Signed)
 "  History of Present Illness Holly Riley is a 69 y.o. female former smoker followed through the lung cancer screening program referred for an abnormal pulmonary lung nodule.    03/13/2024 Discussed the use of AI scribe software for clinical note transcription with the patient, who gave verbal consent to proceed.   Synopsis 69 y.o. female former smoker retired Holly Riley followed by the lung cancer screening program. She was  referred for consult 09/2023 after abnormal chest imaging. PET scan was done as follow up to the abnormal LDCT Chest. PET scan showed low level hypermetabolism within a persistent ground glass right lower lobe nodule, concerning for Stage 1 A adenocarcinoma. We recommended a robotic  assisted navigational bronchoscopy with biopsies . She has a strong belief that when cancer is biopsied, it seeds the rest of the body. She underwent the procedure 11/01/2023. Biopsy of the RLL showed atypical cells. Plan was for a 3 month follow up Ct Chest. This was done 01/2024. There had been some interval growth. We discussed watchful waiting vs repeat biopsy. Pt. Preferred repeat biopsy.She is here today to review results. History of Present Illness Pt. Presents for follow up after bronchoscopy with biopsies. She states she did well after the bronchoscopy with biopsies.  No bleeding, no fever, no discolored secretions, no worsening shortness of breath, no adverse reaction to anesthesia.  We have reviewed the results of her biopsy done on March 06, 2024.  Both the fine-needle aspiration and the brushing of the right lower lobe nodule were negative for malignant cells.  This is reassuring.  Plan will be for 67-month follow-up CT chest to maintain surveillance on the right lower lobe.  Patient is in agreement with this plan.  CT scan will be due around April 2026 and patient will follow-up with me 1 to 2 weeks after the scan to review the results.  I have asked her to call to be seen sooner if she  develops any blood in her sputum or has any unexplained weight loss.  Patient verbalized understanding.  Physical assessment was unremarkable.  Patient states she has been doing well in regard to her breathing.  Blood pressure was elevated at today's visit.  Patient states she has whitecoat syndrome.  She is going to recheck her blood pressure when she is home so that we can document a follow-up blood pressure.     Test Results:  Cytology 03/06/2024 A. LUNG, RLL, FINE NEEDLE ASPIRATION  BIOPSIES:  - Alveolated lung parenchyma with intra-alveolar macrophages.  - Negative for malignancy.   B. LUNG, RLL, BRUSHING:  - Reactive bronchial cells and macrophages.  - Negative for malignancy.   C. LUNG, RLL, LAVAGE:    FINAL MICROSCOPIC DIAGNOSIS:  - No malignant cells identified  - Reactive bronchial cells and macrophages   CT Chest 02/10/2024 Peripheral ground glass nodule in the right lower lobe measures 18 x 14 mm, increased from 12 x 13 mm on CT 09/07/2023. Clip adjacent to the nodule. No new pulmonary nodules. No focal consolidation or pulmonary edema. No pleural effusion or pneumothorax. Interval enlargement of right lower lobe ground glass nodule concerning for low-grade adenocarcinoma.   CT cardiac scoring 11/30/2023 Stable appearance of known ground-glass right lower lobe pulmonary nodule. Aortic Atherosclerosis  Right lower lobe ground-glass nodule with adjacent fiducial marker is again seen   11/01/2023 cytology FINAL MICROSCOPIC DIAGNOSIS:  A. LUNG, RLL, FINE NEEDLE ASPIRATION  BIOPSY:  - Atypical.   COMMENT:   The cell block sections contain  lung tissue fragments with  peribronchiolar metaplasia and evidence of pneumocyte hyperplasia with  atypia (atypical adenomatous hyperplasia).   Definite in situ malignancy  is not identified.  Surveillance is suggested.    Nuclear med pet imaging 10/07/2023 Persistent ground-glass nodule in the posterior right lower lobe, 1.4 x  1.5 cm (7/44), SUV max 2.7. No additional abnormal hypermetabolism. 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 Left adrenal adenoma. Aortic atherosclerosis (ICD10-I70.0). Coronary artery calcification.   CT chest lung cancer screening 09/07/2023 Nodule along the right major fissure measures 4.9 mm. Dependent right lower lobe density, favored to be sub solid or mixed attenuation, measures 13.8 mm. This is characterized as solid on the DynaCAD software. Lung-RADS 4A, suspicious. Follow up low-dose chest CT without contrast in 3 months  Dependent right lower lobe 13.8 mm density which could represent atelectasis or a mixed attenuation nodule.    Latest Ref Rng & Units 03/06/2024    8:00 AM 11/01/2023    6:45 AM 09/23/2023   12:00 AM  CBC  WBC 4.0 - 10.5 K/uL 7.5  7.9  6.6      Hemoglobin 12.0 - 15.0 g/dL 85.0  84.7  85.1      Hematocrit 36.0 - 46.0 % 44.9  46.1  44      Platelets 150 - 400 K/uL 248  259  275         This result is from an external source.       Latest Ref Rng & Units 03/06/2024    8:00 AM 11/01/2023    6:45 AM 09/23/2023   12:00 AM  BMP  Glucose 70 - 99 mg/dL 843  832    BUN 8 - 23 mg/dL 13  12    Creatinine 9.55 - 1.00 mg/dL 9.04  9.00  0.8      Sodium 135 - 145 mmol/L 139  143  141      Potassium 3.5 - 5.1 mmol/L 3.8  4.2  4.1      Chloride 98 - 111 mmol/L 106  104    CO2 22 - 32 mmol/L 22  22    Calcium  8.9 - 10.3 mg/dL 9.3  9.2       This result is from an external source.    BNP No results found for: BNP  ProBNP No results found for: PROBNP  PFT    Component Value Date/Time   FEV1PRE 2.33 10/19/2023 1044   FEV1POST 2.45 10/19/2023 1044   FVCPRE 2.83 10/19/2023 1044   FVCPOST 2.87 10/19/2023 1044   TLC 5.11 10/19/2023 1044   DLCOUNC 19.92 10/19/2023 1044   PREFEV1FVCRT 82 10/19/2023 1044   PSTFEV1FVCRT 85 10/19/2023 1044    DG C-ARM  BRONCHOSCOPY Result Date: 03/06/2024 C-ARM BRONCHOSCOPY: Fluoroscopy was utilized by the requesting physician.  No radiographic interpretation.   DG Chest Port 1 View Result Date: 03/06/2024 CLINICAL DATA:  Post bronchoscopy. EXAM: PORTABLE CHEST 1 VIEW COMPARISON:  11/01/2023 FINDINGS: Lungs are hypoinflated without acute airspace consolidation, effusion or pneumothorax. Tiny surgical clip over the extreme right lung base unchanged. Here pain linear metallic density projects over the right lung base/upper quadrant likely external to the patient. Recommend clinical correlation. Cardiomediastinal silhouette and remainder of the exam is unchanged. IMPRESSION: 1. Hypoinflation without acute cardiopulmonary disease. Electronically Signed   By: Toribio Agreste M.D.   On: 03/06/2024 12:04     Past medical hx Past Medical History:  Diagnosis Date   Allergy    seasonal and cats   Arthritis    Cataract    Diabetes mellitus without complication (HCC)    HTN (hypertension)    Hyperlipidemia    Hypothyroidism    S/P cervical spinal fusion    C5-7   Spinal stenosis    NS - early, MRI   Thyroid  disease      Social History[1]  Ms.Solt reports that she quit smoking about 15 years ago. Her smoking use included cigarettes. She started smoking about 55 years ago. She has a 30 pack-year smoking history. She has been exposed to tobacco smoke. She has never used smokeless tobacco. She reports that she does not drink alcohol and does not use drugs.  Tobacco Cessation: Counseling given: Not Answered Former smoker   Past surgical hx, Family hx, Social hx all reviewed.  Current Outpatient Medications on File Prior to Visit  Medication Sig   Cholecalciferol (VITAMIN D3) 50 MCG (2000 UT) capsule Take 2,000 Units by mouth daily.    Coenzyme Q10 (CO Q 10 PO)    Flaxseed, Linseed, (FLAX SEED OIL PO) Take 1 tablet by mouth daily.   Garlic 10 MG CAPS Take 10 mg by mouth daily.   Krill Oil 500 MG CAPS     levothyroxine  (SYNTHROID ) 88 MCG tablet Take 1 tablet (88 mcg total) by mouth daily before breakfast.   lisinopril  (ZESTRIL ) 10 MG tablet Take 1 tablet (10 mg total) by mouth daily.   loratadine (CLARITIN) 10 MG tablet Take 10 mg by mouth daily.   Methylcobalamin (B12) 5000 MCG SUBL    Omega-3 Fatty Acids (FISH OIL CONCENTRATE) 1000 MG CAPS Take 1,000 mg by mouth daily.   promethazine -dextromethorphan (PROMETHAZINE -DM) 6.25-15 MG/5ML syrup Take 5 mLs by mouth 4 (four) times daily as needed for cough.   Red Yeast Rice 600 MG TABS    sucralfate  (CARAFATE ) 1 GM/10ML suspension SHAKE LIQUID AND TAKE 10 ML(1 GRAM) BY MOUTH TWICE DAILY   No current facility-administered medications on file prior to visit.     Allergies[2]  Review Of Systems:  Constitutional:   No  weight loss, night sweats,  Fevers, chills, fatigue, or  lassitude.  HEENT:   No headaches,  Difficulty swallowing,  Tooth/dental problems, or  Sore throat,                No sneezing, itching, ear ache, nasal congestion, post nasal drip,   CV:  No chest pain,  Orthopnea, PND, swelling in lower extremities, anasarca, dizziness, palpitations, syncope.   GI  No heartburn, indigestion, abdominal pain, nausea, vomiting, diarrhea, change in bowel habits, loss of appetite, bloody stools.   Resp: No shortness of breath with exertion or at rest.  No excess mucus, no productive cough,  No non-productive cough,  No coughing up of blood.  No change in color of mucus.  No wheezing.  No chest wall deformity  Skin: no rash or lesions.  GU: no dysuria, change in color of urine, no urgency or frequency.  No flank pain, no hematuria   MS:  No joint pain or swelling.  No decreased range of motion.  No back pain.  Psych:  No change in mood or affect. No depression or anxiety.  No memory loss.   Vital Signs BP (!) 155/99   Pulse 95   Temp 98 F (36.7 C) (Oral)   Ht 5' 5.5 (1.664 m)   Wt 194 lb 3.2 oz (88.1 kg)   SpO2  98%   BMI 31.83  kg/m    Physical Exam:  General- No distress,  A&Ox3, pleasant ENT: No sinus tenderness, TM clear, pale nasal mucosa, no oral exudate,no post nasal drip, no LAN Cardiac: S1, S2, regular rate and rhythm, no murmur Chest: No wheeze/ rales/ dullness; no accessory muscle use, no nasal flaring, no sternal retractions Abd.: Soft Non-tender, ND, BS +, Body mass index is 31.83 kg/m.  Ext: No clubbing cyanosis, edema, no obvious deformities Neuro:  normal strength, MAE x 4, A&Ox 3, appropriate Skin: No rashes, warm and dry, no obvious skin lesions  Psych: normal mood and behavior  Physical Exam    Assessment/Plan Post bronchoscopy with biopsies  Former smoker Abnormal right lower lobe nodule Previous biopsy 10/08/2023 with negative cytology Plan I am glad you did well after your biopsy. We have reviewed your biopsy results. They were negative for malignancy, which is reassuring.  We will do a 3 month follow up scan in April 2026. You will follow up with me 1-2 weeks after to maintain surveillance.  Call for any blood when you cough, or unexplained weight loss.  We will get you in to be seen sooner.  Please contact office for sooner follow up if symptoms do not improve or worsen or seek emergency care   Call us  if you need us  !  I spent 20 minutes dedicated to the care of this patient on the date of this encounter to include pre-visit review of records, face-to-face time with the patient discussing conditions above, post visit ordering of testing, clinical documentation with the electronic health record, making appropriate referrals as documented, and communicating necessary information to the patient's healthcare team.   Assessment & Plan        Lauraine JULIANNA Lites, NP 03/13/2024  10:48 AM             [1]  Social History Tobacco Use   Smoking status: Former    Current packs/day: 0.00    Average packs/day: 0.8 packs/day for 40.0 years (30.0 ttl pk-yrs)    Types:  Cigarettes    Start date: 03/01/1969    Quit date: 03/01/2009    Years since quitting: 15.0    Passive exposure: Past   Smokeless tobacco: Never  Vaping Use   Vaping status: Never Used  Substance Use Topics   Alcohol use: No   Drug use: No  [2]  Allergies Allergen Reactions   Hydrocodone-Acetaminophen  Itching    Other Reaction(s): Unknown   Morphine     Other Reaction(s): Other, Unknown  hallucinations, hallucinations, hallucinations    Other Reaction(s): Unknown    hallucinations hallucinations hallucinations  hallucinations, hallucinations, hallucinations  Other Reaction(s): Unknown  hallucinations hallucinations hallucinations    hallucinations hallucinations hallucinations   Morphine And Codeine  Other (See Comments)    hallucinations hallucinations   Oxycodone -Acetaminophen  Itching    Other Reaction(s): Other, Unknown  tachycardia    Other Reaction(s): Unknown  tachycardia  Other Reaction(s): Unknown    tachycardia   Ezetimibe  Other (See Comments)    myalgias    Hydromorphone     hallucentations  Other Reaction(s): Hallucinations, Unknown  Other Reaction(s): Unknown    hallucentations  Other Reaction(s): Unknown  hallucentations    hallucentations   Statins Other (See Comments)    myalgis   Flonase  [Fluticasone ] Other (See Comments)    Causes nose bleeds   Percocet [Oxycodone -Acetaminophen ] Itching and Other (See Comments)    tachycardia   Singulair  [Montelukast ] Other (See Comments)  Insomnia    Vicodin [Hydrocodone-Acetaminophen ]    "

## 2024-03-13 NOTE — Telephone Encounter (Signed)
 Called and confirmed patient identity with two identifiers.  Instructed patient to recheck blood pressure as blood pressure in clinic was 155/99, HR 95. Patient reports history of white coat hypertension. On recheck with home blood pressure monitor, BP 124/68 and HR 86.

## 2024-03-13 NOTE — Patient Instructions (Addendum)
 It is good to see you today. I am glad you did well after your biopsy. We have reviewed your biopsy results. They were negative for malignancy, which is reassuring.  We will do a 3 month follow up scan in April 2026. You will follow up with me 1-2 weeks after to maintain surveillance.  Call for any blood when you cough, or unexplained weight loss.  We will get you in to be seen sooner.  Please contact office for sooner follow up if symptoms do not improve or worsen or seek emergency care   Call us  if you need us  !

## 2024-06-07 ENCOUNTER — Ambulatory Visit: Admitting: Family Medicine

## 2024-06-11 ENCOUNTER — Ambulatory Visit

## 2024-06-19 ENCOUNTER — Ambulatory Visit: Admitting: Acute Care

## 2024-11-29 ENCOUNTER — Encounter: Admitting: Family Medicine
